# Patient Record
Sex: Female | Born: 1960 | Race: White | Hispanic: No | Smoking: Current every day smoker
Health system: Southern US, Community
[De-identification: ages and names within clinical notes are randomized; demographics above are authoritative.]

## PROBLEM LIST (undated history)

## (undated) DIAGNOSIS — R040 Epistaxis: Secondary | ICD-10-CM

## (undated) DIAGNOSIS — N390 Urinary tract infection, site not specified: Secondary | ICD-10-CM

## (undated) DIAGNOSIS — E785 Hyperlipidemia, unspecified: Secondary | ICD-10-CM

## (undated) DIAGNOSIS — Z973 Presence of spectacles and contact lenses: Secondary | ICD-10-CM

## (undated) DIAGNOSIS — E7800 Pure hypercholesterolemia, unspecified: Secondary | ICD-10-CM

## (undated) DIAGNOSIS — I10 Essential (primary) hypertension: Secondary | ICD-10-CM

## (undated) DIAGNOSIS — J410 Simple chronic bronchitis: Secondary | ICD-10-CM

## (undated) DIAGNOSIS — J301 Allergic rhinitis due to pollen: Secondary | ICD-10-CM

## (undated) DIAGNOSIS — K649 Unspecified hemorrhoids: Secondary | ICD-10-CM

## (undated) DIAGNOSIS — N393 Stress incontinence (female) (male): Secondary | ICD-10-CM

## (undated) DIAGNOSIS — R000 Tachycardia, unspecified: Secondary | ICD-10-CM

## (undated) DIAGNOSIS — N812 Incomplete uterovaginal prolapse: Secondary | ICD-10-CM

## (undated) DIAGNOSIS — Z87440 Personal history of urinary (tract) infections: Secondary | ICD-10-CM

## (undated) HISTORY — DX: Allergic rhinitis due to pollen: J30.1

## (undated) HISTORY — DX: Urinary tract infection, site not specified: N39.0

## (undated) HISTORY — PX: COLONOSCOPY: SHX174

## (undated) HISTORY — DX: Tachycardia, unspecified: R00.0

---

## 1998-05-30 ENCOUNTER — Other Ambulatory Visit: Admission: RE | Admit: 1998-05-30 | Discharge: 1998-05-30 | Payer: Self-pay | Admitting: Gynecology

## 1999-10-24 ENCOUNTER — Other Ambulatory Visit: Admission: RE | Admit: 1999-10-24 | Discharge: 1999-10-24 | Payer: Self-pay | Admitting: Gynecology

## 2000-12-21 ENCOUNTER — Other Ambulatory Visit: Admission: RE | Admit: 2000-12-21 | Discharge: 2000-12-21 | Payer: Self-pay | Admitting: Gynecology

## 2002-01-17 ENCOUNTER — Other Ambulatory Visit: Admission: RE | Admit: 2002-01-17 | Discharge: 2002-01-17 | Payer: Self-pay | Admitting: Gynecology

## 2003-02-21 ENCOUNTER — Other Ambulatory Visit: Admission: RE | Admit: 2003-02-21 | Discharge: 2003-02-21 | Payer: Self-pay | Admitting: Gynecology

## 2004-08-15 ENCOUNTER — Other Ambulatory Visit: Admission: RE | Admit: 2004-08-15 | Discharge: 2004-08-15 | Payer: Self-pay | Admitting: Gynecology

## 2006-11-07 ENCOUNTER — Emergency Department (HOSPITAL_COMMUNITY): Admission: EM | Admit: 2006-11-07 | Discharge: 2006-11-08 | Payer: Self-pay | Admitting: Emergency Medicine

## 2010-12-27 LAB — BASIC METABOLIC PANEL
BUN: 11
Chloride: 104
Glucose, Bld: 141 — ABNORMAL HIGH
Potassium: 3.4 — ABNORMAL LOW
Sodium: 138

## 2010-12-27 LAB — CBC
HCT: 41.8
Hemoglobin: 14.8
MCV: 91.6
Platelets: 372
WBC: 11.3 — ABNORMAL HIGH

## 2011-07-24 ENCOUNTER — Other Ambulatory Visit: Payer: Self-pay | Admitting: Gynecology

## 2011-07-24 DIAGNOSIS — R928 Other abnormal and inconclusive findings on diagnostic imaging of breast: Secondary | ICD-10-CM

## 2011-07-29 ENCOUNTER — Ambulatory Visit
Admission: RE | Admit: 2011-07-29 | Discharge: 2011-07-29 | Disposition: A | Discharge status: Home / Self Care | Payer: BC Managed Care – PPO | Source: Ambulatory Visit | Attending: Gynecology | Admitting: Gynecology

## 2011-07-29 DIAGNOSIS — R928 Other abnormal and inconclusive findings on diagnostic imaging of breast: Secondary | ICD-10-CM

## 2012-01-02 ENCOUNTER — Telehealth: Payer: Self-pay | Admitting: *Deleted

## 2012-01-02 NOTE — Telephone Encounter (Signed)
Patient says she dropped off a form to be filled out for work yesterday and wants to know when it can be picked up.  She says she needs it by Monday afternoon.   [phone].

## 2012-02-06 ENCOUNTER — Other Ambulatory Visit: Payer: Self-pay | Admitting: Gynecology

## 2012-02-06 DIAGNOSIS — N63 Unspecified lump in unspecified breast: Secondary | ICD-10-CM

## 2012-02-20 ENCOUNTER — Ambulatory Visit
Admission: RE | Admit: 2012-02-20 | Discharge: 2012-02-20 | Disposition: A | Discharge status: Home / Self Care | Payer: BC Managed Care – PPO | Source: Ambulatory Visit | Attending: Gynecology | Admitting: Gynecology

## 2012-02-20 DIAGNOSIS — N63 Unspecified lump in unspecified breast: Secondary | ICD-10-CM

## 2012-07-20 ENCOUNTER — Other Ambulatory Visit: Payer: Self-pay | Admitting: *Deleted

## 2013-06-28 ENCOUNTER — Other Ambulatory Visit: Payer: Self-pay | Admitting: Physician Assistant

## 2013-06-28 ENCOUNTER — Ambulatory Visit
Admission: RE | Admit: 2013-06-28 | Discharge: 2013-06-28 | Disposition: A | Discharge status: Home / Self Care | Payer: BC Managed Care – PPO | Source: Ambulatory Visit | Attending: Physician Assistant | Admitting: Physician Assistant

## 2013-06-28 DIAGNOSIS — R109 Unspecified abdominal pain: Secondary | ICD-10-CM

## 2013-06-28 DIAGNOSIS — R197 Diarrhea, unspecified: Secondary | ICD-10-CM

## 2013-06-30 ENCOUNTER — Emergency Department (HOSPITAL_COMMUNITY): Payer: BC Managed Care – PPO | Admitting: Anesthesiology

## 2013-06-30 ENCOUNTER — Inpatient Hospital Stay (HOSPITAL_COMMUNITY)
Admission: EM | Admit: 2013-06-30 | Discharge: 2013-07-09 | DRG: 340 | Disposition: A | Discharge status: Home / Self Care | Payer: BC Managed Care – PPO | Attending: Surgery | Admitting: Surgery

## 2013-06-30 ENCOUNTER — Encounter (HOSPITAL_COMMUNITY): Payer: Self-pay | Admitting: Emergency Medicine

## 2013-06-30 ENCOUNTER — Other Ambulatory Visit: Payer: BC Managed Care – PPO

## 2013-06-30 ENCOUNTER — Encounter (HOSPITAL_COMMUNITY): Admission: EM | Disposition: A | Discharge status: Home / Self Care | Payer: Self-pay | Source: Home / Self Care

## 2013-06-30 ENCOUNTER — Encounter (HOSPITAL_COMMUNITY): Payer: BC Managed Care – PPO | Admitting: Anesthesiology

## 2013-06-30 ENCOUNTER — Ambulatory Visit
Admission: RE | Admit: 2013-06-30 | Discharge: 2013-06-30 | Disposition: A | Discharge status: Home / Self Care | Payer: BC Managed Care – PPO | Source: Ambulatory Visit | Attending: Physician Assistant | Admitting: Physician Assistant

## 2013-06-30 DIAGNOSIS — K3532 Acute appendicitis with perforation and localized peritonitis, without abscess: Secondary | ICD-10-CM | POA: Diagnosis present

## 2013-06-30 DIAGNOSIS — F17200 Nicotine dependence, unspecified, uncomplicated: Secondary | ICD-10-CM | POA: Diagnosis present

## 2013-06-30 DIAGNOSIS — E7800 Pure hypercholesterolemia, unspecified: Secondary | ICD-10-CM | POA: Diagnosis present

## 2013-06-30 DIAGNOSIS — I10 Essential (primary) hypertension: Secondary | ICD-10-CM | POA: Diagnosis present

## 2013-06-30 DIAGNOSIS — R1031 Right lower quadrant pain: Secondary | ICD-10-CM

## 2013-06-30 DIAGNOSIS — R112 Nausea with vomiting, unspecified: Secondary | ICD-10-CM

## 2013-06-30 DIAGNOSIS — R197 Diarrhea, unspecified: Secondary | ICD-10-CM

## 2013-06-30 DIAGNOSIS — K3533 Acute appendicitis with perforation and localized peritonitis, with abscess: Principal | ICD-10-CM | POA: Diagnosis present

## 2013-06-30 DIAGNOSIS — Z7982 Long term (current) use of aspirin: Secondary | ICD-10-CM

## 2013-06-30 DIAGNOSIS — Z79899 Other long term (current) drug therapy: Secondary | ICD-10-CM

## 2013-06-30 DIAGNOSIS — R109 Unspecified abdominal pain: Secondary | ICD-10-CM

## 2013-06-30 DIAGNOSIS — N739 Female pelvic inflammatory disease, unspecified: Secondary | ICD-10-CM

## 2013-06-30 DIAGNOSIS — K3580 Unspecified acute appendicitis: Secondary | ICD-10-CM

## 2013-06-30 HISTORY — PX: LAPAROSCOPIC APPENDECTOMY: SHX408

## 2013-06-30 HISTORY — DX: Essential (primary) hypertension: I10

## 2013-06-30 HISTORY — DX: Pure hypercholesterolemia, unspecified: E78.00

## 2013-06-30 LAB — COMPREHENSIVE METABOLIC PANEL
ALT: 16 U/L (ref 0–35)
AST: 17 U/L (ref 0–37)
Albumin: 3.5 g/dL (ref 3.5–5.2)
Alkaline Phosphatase: 96 U/L (ref 39–117)
BUN: 14 mg/dL (ref 6–23)
CALCIUM: 9.7 mg/dL (ref 8.4–10.5)
CO2: 20 mEq/L (ref 19–32)
Chloride: 91 mEq/L — ABNORMAL LOW (ref 96–112)
Creatinine, Ser: 0.69 mg/dL (ref 0.50–1.10)
GFR calc non Af Amer: >90 mL/min (ref >90)
Glucose, Bld: 103 mg/dL — ABNORMAL HIGH (ref 70–99)
Potassium: 3.9 mEq/L (ref 3.7–5.3)
SODIUM: 133 meq/L — AB (ref 137–147)
TOTAL PROTEIN: 7.7 g/dL (ref 6.0–8.3)
Total Bilirubin: 0.4 mg/dL (ref 0.3–1.2)

## 2013-06-30 LAB — CBC WITH DIFFERENTIAL/PLATELET
BASOS ABS: 0.2 10*3/uL — AB (ref 0.0–0.1)
Basophils Relative: 1 % (ref 0–1)
Eosinophils Absolute: 0.2 10*3/uL (ref 0.0–0.7)
Eosinophils Relative: 1 % (ref 0–5)
HCT: 35.9 % — ABNORMAL LOW (ref 36.0–46.0)
Hemoglobin: 13 g/dL (ref 12.0–15.0)
LYMPHS PCT: 12 % (ref 12–46)
Lymphs Abs: 2 10*3/uL (ref 0.7–4.0)
MCH: 31.6 pg (ref 26.0–34.0)
MCHC: 36.2 g/dL — ABNORMAL HIGH (ref 30.0–36.0)
MCV: 87.1 fL (ref 78.0–100.0)
MONOS PCT: 9 % (ref 3–12)
Monocytes Absolute: 1.5 10*3/uL — ABNORMAL HIGH (ref 0.1–1.0)
Neutro Abs: 12.5 10*3/uL — ABNORMAL HIGH (ref 1.7–7.7)
Neutrophils Relative %: 77 % (ref 43–77)
PLATELETS: 416 10*3/uL — AB (ref 150–400)
RBC: 4.12 MIL/uL (ref 3.87–5.11)
RDW: 12.3 % (ref 11.5–15.5)
WBC: 16.4 10*3/uL — AB (ref 4.0–10.5)

## 2013-06-30 SURGERY — APPENDECTOMY, LAPAROSCOPIC
Anesthesia: General | Site: Abdomen

## 2013-06-30 MED ORDER — SODIUM CHLORIDE 0.9 % IR SOLN
Status: DC | PRN
  Administered 2013-06-30: 2000 mL

## 2013-06-30 MED ORDER — STERILE WATER FOR INJECTION IJ SOLN
INTRAMUSCULAR | Status: AC
  Filled 2013-06-30: qty 10

## 2013-06-30 MED ORDER — PHENYLEPHRINE 40 MCG/ML (10ML) SYRINGE FOR IV PUSH (FOR BLOOD PRESSURE SUPPORT)
PREFILLED_SYRINGE | INTRAVENOUS | Status: AC
  Filled 2013-06-30: qty 10

## 2013-06-30 MED ORDER — PROMETHAZINE HCL 25 MG/ML IJ SOLN: 6.2500 mg | INTRAMUSCULAR | Status: DC | PRN

## 2013-06-30 MED ORDER — OXYCODONE-ACETAMINOPHEN 5-325 MG PO TABS
1.0000 | ORAL_TABLET | ORAL | Status: DC | PRN
  Administered 2013-07-01: 1 via ORAL
  Administered 2013-07-02 – 2013-07-09 (×17): 2 via ORAL
  Filled 2013-06-30 (×9): qty 2
  Filled 2013-06-30: qty 1
  Filled 2013-06-30 (×8): qty 2

## 2013-06-30 MED ORDER — DEXAMETHASONE SODIUM PHOSPHATE 4 MG/ML IJ SOLN
INTRAMUSCULAR | Status: DC | PRN
  Administered 2013-06-30: 4 mg via INTRAVENOUS

## 2013-06-30 MED ORDER — LIDOCAINE HCL (CARDIAC) 20 MG/ML IV SOLN
INTRAVENOUS | Status: AC
  Filled 2013-06-30: qty 5

## 2013-06-30 MED ORDER — NEOSTIGMINE METHYLSULFATE 1 MG/ML IJ SOLN
INTRAMUSCULAR | Status: DC | PRN
  Administered 2013-06-30: 3 mg via INTRAVENOUS

## 2013-06-30 MED ORDER — OXYCODONE HCL 5 MG PO TABS
5.0000 mg | ORAL_TABLET | Freq: Once | ORAL | Status: AC | PRN
  Administered 2013-06-30: 5 mg via ORAL

## 2013-06-30 MED ORDER — OXYCODONE HCL 5 MG PO TABS
ORAL_TABLET | ORAL | Status: AC
Start: 2013-06-30 — End: 2013-07-01
  Filled 2013-06-30: qty 1

## 2013-06-30 MED ORDER — HYDROMORPHONE HCL PF 1 MG/ML IJ SOLN: 0.2500 mg | INTRAMUSCULAR | Status: DC | PRN

## 2013-06-30 MED ORDER — ARTIFICIAL TEARS OP OINT
TOPICAL_OINTMENT | OPHTHALMIC | Status: AC
  Filled 2013-06-30: qty 3.5

## 2013-06-30 MED ORDER — METRONIDAZOLE IN NACL 5-0.79 MG/ML-% IV SOLN
500.0000 mg | Freq: Three times a day (TID) | INTRAVENOUS | Status: DC
  Administered 2013-06-30 – 2013-07-04 (×11): 500 mg via INTRAVENOUS
  Filled 2013-06-30 (×12): qty 100

## 2013-06-30 MED ORDER — NEOSTIGMINE METHYLSULFATE 1 MG/ML IJ SOLN
INTRAMUSCULAR | Status: AC
  Filled 2013-06-30: qty 10

## 2013-06-30 MED ORDER — LACTATED RINGERS IV SOLN
INTRAVENOUS | Status: DC | PRN
  Administered 2013-06-30 (×2): via INTRAVENOUS

## 2013-06-30 MED ORDER — ONDANSETRON HCL 4 MG/2ML IJ SOLN
INTRAMUSCULAR | Status: AC
  Filled 2013-06-30: qty 2

## 2013-06-30 MED ORDER — ONDANSETRON HCL 4 MG/2ML IJ SOLN
4.0000 mg | Freq: Four times a day (QID) | INTRAMUSCULAR | Status: DC | PRN
  Administered 2013-07-02: 4 mg via INTRAVENOUS
  Filled 2013-06-30: qty 2

## 2013-06-30 MED ORDER — BUPIVACAINE HCL 0.25 % IJ SOLN
INTRAMUSCULAR | Status: DC | PRN
  Administered 2013-06-30: 20 mL

## 2013-06-30 MED ORDER — IBUPROFEN 600 MG PO TABS
600.0000 mg | ORAL_TABLET | Freq: Four times a day (QID) | ORAL | Status: DC | PRN
  Administered 2013-06-30 – 2013-07-01 (×3): 600 mg via ORAL
  Filled 2013-06-30 (×3): qty 1

## 2013-06-30 MED ORDER — ARTIFICIAL TEARS OP OINT
TOPICAL_OINTMENT | OPHTHALMIC | Status: DC | PRN
  Administered 2013-06-30: 1 via OPHTHALMIC

## 2013-06-30 MED ORDER — HYDROMORPHONE HCL PF 1 MG/ML IJ SOLN: 1.0000 mg | INTRAMUSCULAR | Status: DC | PRN

## 2013-06-30 MED ORDER — PROPOFOL 10 MG/ML IV BOLUS
INTRAVENOUS | Status: DC | PRN
  Administered 2013-06-30: 200 mg via INTRAVENOUS

## 2013-06-30 MED ORDER — CIPROFLOXACIN IN D5W 400 MG/200ML IV SOLN
400.0000 mg | Freq: Once | INTRAVENOUS | Status: AC
  Administered 2013-06-30: 400 mg via INTRAVENOUS

## 2013-06-30 MED ORDER — GLYCOPYRROLATE 0.2 MG/ML IJ SOLN
INTRAMUSCULAR | Status: AC
  Filled 2013-06-30: qty 3

## 2013-06-30 MED ORDER — ONDANSETRON HCL 4 MG/2ML IJ SOLN
INTRAMUSCULAR | Status: DC | PRN
  Administered 2013-06-30: 4 mg via INTRAVENOUS

## 2013-06-30 MED ORDER — VECURONIUM BROMIDE 10 MG IV SOLR
INTRAVENOUS | Status: AC
  Filled 2013-06-30: qty 10

## 2013-06-30 MED ORDER — VECURONIUM BROMIDE 10 MG IV SOLR
INTRAVENOUS | Status: DC | PRN
  Administered 2013-06-30: 2 mg via INTRAVENOUS
  Administered 2013-06-30: 1 mg via INTRAVENOUS

## 2013-06-30 MED ORDER — IOHEXOL 300 MG/ML  SOLN
100.0000 mL | Freq: Once | INTRAMUSCULAR | Status: AC | PRN
  Administered 2013-06-30: 100 mL via INTRAVENOUS

## 2013-06-30 MED ORDER — SUCCINYLCHOLINE CHLORIDE 20 MG/ML IJ SOLN
INTRAMUSCULAR | Status: AC
  Filled 2013-06-30: qty 1

## 2013-06-30 MED ORDER — PROPOFOL 10 MG/ML IV BOLUS
INTRAVENOUS | Status: AC
  Filled 2013-06-30: qty 20

## 2013-06-30 MED ORDER — GLYCOPYRROLATE 0.2 MG/ML IJ SOLN
INTRAMUSCULAR | Status: DC | PRN
  Administered 2013-06-30: 0.4 mg via INTRAVENOUS

## 2013-06-30 MED ORDER — HYDROMORPHONE HCL PF 1 MG/ML IJ SOLN
INTRAMUSCULAR | Status: AC
  Filled 2013-06-30: qty 1

## 2013-06-30 MED ORDER — MIDAZOLAM HCL 2 MG/2ML IJ SOLN
INTRAMUSCULAR | Status: AC
  Filled 2013-06-30: qty 2

## 2013-06-30 MED ORDER — ENOXAPARIN SODIUM 40 MG/0.4ML ~~LOC~~ SOLN
40.0000 mg | SUBCUTANEOUS | Status: DC
  Administered 2013-07-01 – 2013-07-04 (×4): 40 mg via SUBCUTANEOUS
  Filled 2013-06-30 (×4): qty 0.4

## 2013-06-30 MED ORDER — SODIUM CHLORIDE 0.9 % IV BOLUS (SEPSIS)
1000.0000 mL | Freq: Once | INTRAVENOUS | Status: AC
  Administered 2013-06-30: 1000 mL via INTRAVENOUS

## 2013-06-30 MED ORDER — CIPROFLOXACIN IN D5W 400 MG/200ML IV SOLN
400.0000 mg | Freq: Two times a day (BID) | INTRAVENOUS | Status: DC
  Administered 2013-07-01 – 2013-07-03 (×6): 400 mg via INTRAVENOUS
  Filled 2013-06-30 (×7): qty 200

## 2013-06-30 MED ORDER — BUPIVACAINE HCL (PF) 0.25 % IJ SOLN
INTRAMUSCULAR | Status: AC
  Filled 2013-06-30: qty 30

## 2013-06-30 MED ORDER — SUCCINYLCHOLINE CHLORIDE 20 MG/ML IJ SOLN
INTRAMUSCULAR | Status: DC | PRN
  Administered 2013-06-30: 120 mg via INTRAVENOUS

## 2013-06-30 MED ORDER — FENTANYL CITRATE 0.05 MG/ML IJ SOLN
INTRAMUSCULAR | Status: DC | PRN
  Administered 2013-06-30 (×3): 50 ug via INTRAVENOUS
  Administered 2013-06-30: 100 ug via INTRAVENOUS

## 2013-06-30 MED ORDER — LIDOCAINE HCL (CARDIAC) 20 MG/ML IV SOLN
INTRAVENOUS | Status: DC | PRN
  Administered 2013-06-30 (×2): 50 mg via INTRAVENOUS

## 2013-06-30 MED ORDER — POTASSIUM CHLORIDE IN NACL 20-0.9 MEQ/L-% IV SOLN
INTRAVENOUS | Status: DC
  Administered 2013-06-30 – 2013-07-01 (×2): via INTRAVENOUS
  Filled 2013-06-30 (×10): qty 1000

## 2013-06-30 MED ORDER — OXYCODONE HCL 5 MG/5ML PO SOLN: 5.0000 mg | Freq: Once | ORAL | Status: AC | PRN

## 2013-06-30 MED ORDER — FENTANYL CITRATE 0.05 MG/ML IJ SOLN
INTRAMUSCULAR | Status: AC
  Filled 2013-06-30: qty 5

## 2013-06-30 MED ORDER — MIDAZOLAM HCL 5 MG/5ML IJ SOLN
INTRAMUSCULAR | Status: DC | PRN
  Administered 2013-06-30: 2 mg via INTRAVENOUS

## 2013-06-30 MED ORDER — ONDANSETRON HCL 4 MG PO TABS: 4.0000 mg | ORAL_TABLET | Freq: Four times a day (QID) | ORAL | Status: DC | PRN

## 2013-06-30 SURGICAL SUPPLY — 57 items
ADH SKN CLS APL DERMABOND .7 (GAUZE/BANDAGES/DRESSINGS) ×2
APL SKNCLS STERI-STRIP NONHPOA (GAUZE/BANDAGES/DRESSINGS) ×2
APPLIER CLIP LOGIC TI 5 (MISCELLANEOUS) IMPLANT
APPLIER CLIP ROT 10 11.4 M/L (STAPLE)
APR CLP MED LRG 11.4X10 (STAPLE)
APR CLP MED LRG 33X5 (MISCELLANEOUS)
BAG SPEC RTRVL LRG 6X4 10 (ENDOMECHANICALS) ×2
BANDAGE ADHESIVE 1X3 (GAUZE/BANDAGES/DRESSINGS) ×12 IMPLANT
BENZOIN TINCTURE PRP APPL 2/3 (GAUZE/BANDAGES/DRESSINGS) ×4 IMPLANT
CANISTER SUCTION 2500CC (MISCELLANEOUS) ×4 IMPLANT
CHLORAPREP W/TINT 26ML (MISCELLANEOUS) ×4 IMPLANT
CLIP APPLIE ROT 10 11.4 M/L (STAPLE) IMPLANT
COVER SURGICAL LIGHT HANDLE (MISCELLANEOUS) ×4 IMPLANT
CUTTER LINEAR ENDO 35 ETS (STAPLE) IMPLANT
CUTTER LINEAR ENDO 35 ETS TH (STAPLE) IMPLANT
DECANTER SPIKE VIAL GLASS SM (MISCELLANEOUS) ×4 IMPLANT
DERMABOND ADVANCED (GAUZE/BANDAGES/DRESSINGS) ×2
DERMABOND ADVANCED .7 DNX12 (GAUZE/BANDAGES/DRESSINGS) ×2 IMPLANT
DRAIN CHANNEL 19F RND (DRAIN) ×4 IMPLANT
DRAPE UTILITY 15X26 W/TAPE STR (DRAPE) ×8 IMPLANT
ELECT REM PT RETURN 9FT ADLT (ELECTROSURGICAL) ×4
ELECTRODE REM PT RTRN 9FT ADLT (ELECTROSURGICAL) ×2 IMPLANT
ENDOLOOP SUT PDS II  0 18 (SUTURE)
ENDOLOOP SUT PDS II 0 18 (SUTURE) IMPLANT
EVACUATOR SILICONE 100CC (DRAIN) ×4 IMPLANT
GLOVE BIOGEL PI IND STRL 6.5 (GLOVE) ×1 IMPLANT
GLOVE BIOGEL PI IND STRL 7.5 (GLOVE) ×2 IMPLANT
GLOVE BIOGEL PI INDICATOR 6.5 (GLOVE) ×2
GLOVE BIOGEL PI INDICATOR 7.5 (GLOVE) ×2
GLOVE SURG SIGNA 7.5 PF LTX (GLOVE) ×4 IMPLANT
GLOVE SURG SS PI 7.5 STRL IVOR (GLOVE) ×3 IMPLANT
GOWN STRL REUS W/ TWL LRG LVL3 (GOWN DISPOSABLE) ×2 IMPLANT
GOWN STRL REUS W/ TWL XL LVL3 (GOWN DISPOSABLE) ×2 IMPLANT
GOWN STRL REUS W/TWL LRG LVL3 (GOWN DISPOSABLE) ×4
GOWN STRL REUS W/TWL XL LVL3 (GOWN DISPOSABLE) ×4
KIT BASIN OR (CUSTOM PROCEDURE TRAY) ×4 IMPLANT
KIT ROOM TURNOVER OR (KITS) ×4 IMPLANT
NS IRRIG 1000ML POUR BTL (IV SOLUTION) ×7 IMPLANT
PAD ARMBOARD 7.5X6 YLW CONV (MISCELLANEOUS) ×8 IMPLANT
POUCH SPECIMEN RETRIEVAL 10MM (ENDOMECHANICALS) ×4 IMPLANT
RELOAD /EVU35 (ENDOMECHANICALS) IMPLANT
RELOAD CUTTER ETS 35MM STAND (ENDOMECHANICALS) ×4 IMPLANT
SCALPEL HARMONIC ACE (MISCELLANEOUS) ×4 IMPLANT
SET IRRIG TUBING LAPAROSCOPIC (IRRIGATION / IRRIGATOR) ×4 IMPLANT
SLEEVE ENDOPATH XCEL 5M (ENDOMECHANICALS) ×4 IMPLANT
SPECIMEN JAR SMALL (MISCELLANEOUS) ×4 IMPLANT
SPONGE DRAIN TRACH 4X4 STRL 2S (GAUZE/BANDAGES/DRESSINGS) ×4 IMPLANT
SPONGE GAUZE 4X4 12PLY (GAUZE/BANDAGES/DRESSINGS) ×3 IMPLANT
SUT ETHILON 2 0 FS 18 (SUTURE) ×6 IMPLANT
SUT MON AB 4-0 PC3 18 (SUTURE) ×4 IMPLANT
TAPE CLOTH SURG 4X10 WHT LF (GAUZE/BANDAGES/DRESSINGS) ×4 IMPLANT
TOWEL OR 17X24 6PK STRL BLUE (TOWEL DISPOSABLE) ×4 IMPLANT
TOWEL OR 17X26 10 PK STRL BLUE (TOWEL DISPOSABLE) ×4 IMPLANT
TRAY LAPAROSCOPIC (CUSTOM PROCEDURE TRAY) ×4 IMPLANT
TROCAR XCEL BLUNT TIP 100MML (ENDOMECHANICALS) ×4 IMPLANT
TROCAR XCEL NON-BLD 5MMX100MML (ENDOMECHANICALS) ×4 IMPLANT
WATER STERILE IRR 1000ML POUR (IV SOLUTION) IMPLANT

## 2013-06-30 NOTE — Progress Notes (Signed)
Late entry for 2220- Pt arrived to 6N08 via stretcher from PACU; Assisted pt to BR, unable to void at this time; Pt ambulated to bed w/ assist; SR up x 2, call bell within reach, introduced pt to nurse and room; Family notified via phone by PACU nurse; Pt in no apparent distress; surgical sites unremarkable at this time, SCDs on...... Marvia PicklesLeesa Sara Jamani Eley, RN

## 2013-06-30 NOTE — ED Provider Notes (Signed)
CSN: [id number]     Arrival date & time 06/30/13  1648 History   First MD Initiated Contact with Patient 06/30/13 1845     Chief Complaint  Patient presents with  . Abdominal Pain     (Consider location/radiation/quality/duration/timing/severity/associated sxs/prior Treatment) HPI 53 year old female with 6 days of abdominal pain. She states it is severe the first few days and then seemed to get better. 3 days ago she noted a fever of 101 but has not checked her temperature since. She was suppose to get a CT scan 2 days ago with a CT scan was broken. Today she received her CT scan and her PCP called her with results that indicated acute appendicitis with rupture. Patient states her pain is about 4/10. She's been able to eat and has not changed amount of pain she is then. No vomiting. Patient states the pain seems to get worse at times and feels like severe cramps like contractions.  Past Medical History  Diagnosis Date  . Hypertension   . Hypercholesteremia    History reviewed. No pertinent past surgical history. No family history on file. History  Substance Use Topics  . Smoking status: Current Every Day Smoker  . Smokeless tobacco: Not on file  . Alcohol Use: Yes   OB History   Grav Para Term Preterm Abortions TAB SAB Ect Mult Living                 Review of Systems  Constitutional: Positive for fever.  Gastrointestinal: Positive for abdominal pain. Negative for vomiting.  Genitourinary: Negative for dysuria.  Musculoskeletal: Negative for back pain.  All other systems reviewed and are negative.     Allergies  Cephalexin  Home Medications   Prior to Admission medications   Not on File   BP 105/55  Pulse 91  Temp(Src) 100.7 F (38.2 C) (Oral)  Resp 18  Ht 5\' 3"  (1.6 m)  Wt 153 lb (69.4 kg)  BMI 27.11 kg/m2  SpO2 98% Physical Exam  Nursing note and vitals reviewed. Constitutional: She is oriented to person, place, and time. She appears well-developed and  well-nourished. No distress.  HENT:  Head: Normocephalic and atraumatic.  Right Ear: External ear normal.  Left Ear: External ear normal.  Nose: Nose normal.  Eyes: Right eye exhibits no discharge. Left eye exhibits no discharge.  Cardiovascular: Normal rate, regular rhythm and normal heart sounds.   Pulmonary/Chest: Effort normal and breath sounds normal.  Abdominal: Soft. There is tenderness in the right lower quadrant. There is no rigidity, no rebound and no guarding.  Neurological: She is alert and oriented to person, place, and time.  Skin: Skin is warm and dry.    ED Course  Procedures (including critical care time) Labs Review Labs Reviewed  CBC WITH DIFFERENTIAL - Abnormal; Notable for the following:    WBC 16.4 (*)    HCT 35.9 (*)    MCHC 36.2 (*)    Platelets 416 (*)    Neutro Abs 12.5 (*)    Monocytes Absolute 1.5 (*)    Basophils Absolute 0.2 (*)    All other components within normal limits  COMPREHENSIVE METABOLIC PANEL - Abnormal; Notable for the following:    Sodium 133 (*)    Chloride 91 (*)    Glucose, Bld 103 (*)    All other components within normal limits    Imaging Review Ct Abdomen Pelvis W Contrast  06/30/2013   CLINICAL DATA:  Mid abdominal pain and diarrhea.  Fever for 4 days.  EXAM: CT ABDOMEN AND PELVIS WITH CONTRAST  TECHNIQUE: Multidetector CT imaging of the abdomen and pelvis was performed using the standard protocol following bolus administration of intravenous contrast.  CONTRAST:  100mL OMNIPAQUE IOHEXOL 300 MG/ML  SOLN  COMPARISON:  None.  FINDINGS: There are inflammatory changes in the right lower quadrant. The appendix is thick walled and dilated to 12 mm. The appendix does contain some contrast and air. The appendix is discontiguous at its base, where there is a small amount of extraluminal contrast. There is also a small extraluminal collection with nondependent air adjacent to the cecal tip measuring 3 cm x 2 cm in size. Inflammatory  stranding is seen in the adjacent fat. There is mild thickening of the wall of the adjacent ileum, which is presumed to be reactive. These findings are consistent with acute appendicitis and appendiceal rupture.  Minor lung base subsegmental atelectasis. The heart is normal in size.  Liver, spleen, gallbladder and pancreas are unremarkable. No bile duct dilation. No adrenal masses. Small left anterior midpole renal cyst. Kidneys otherwise unremarkable. Normal ureters. Normal bladder. Uterus is unremarkable. Right ovary is contiguous with right lower quadrant inflammation. No ovarian mass.  Multiple sigmoid colon diverticula. No diverticulitis. Bowel is otherwise unremarkable. No ascites.  There are degenerative changes of the visualized spine. 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  IMPRESSION: 1. Acute appendicitis with appendiceal rupture, reflected by a small amount of extraluminal air and contrast, and a small periappendiceal abscess. 2. No other acute findings. Critical Value/emergent results were called by telephone at the time of interpretation on 06/30/2013 at 3:38 PM to Dr. Clovis RileyMITCHELL , who verbally acknowledged these results.   Electronically Signed   By: Amie Portlandavid  Ormond M.D.   On: 06/30/2013 15:43     EKG Interpretation None      MDM   Final diagnoses:  Acute appendicitis with rupture    Patient's abdomen with tenderness right lower quadrant no peritonitis. She declines pain medication at this time. She's been made n.p.o. And I have discussed her case with Dr. Magnus IvanBlackman of surgery. He recommends Zosyn and will come to admit the patient.    Audree CamelScott T Yamilet Mcfayden, MD 07/01/13 0000

## 2013-06-30 NOTE — Op Note (Signed)
APPENDECTOMY LAPAROSCOPIC  Procedure Note  [name] 06/30/2013   Pre-op Diagnosis: Ruptured Appendix     Post-op Diagnosis: same  Procedure(s): APPENDECTOMY LAPAROSCOPIC  Surgeon(s): Shelly Rubensteinouglas A Lilyanna Lunt, MD  Anesthesia: General  Staff:  Circulator: Sandria Baleshalondra C Yelverton, RN Scrub Person: Lottie MusselSherrall Diana Watkins, CST  Estimated Blood Loss: Minimal               Specimens: sent to path          Shelly Rubensteinouglas A Jari Carollo   Date: 06/30/2013  Time: 9:31 PM

## 2013-06-30 NOTE — H&P (Signed)
[name] is an 53 y.o. female.   Chief Complaint: Right lower quadrant abdominal pain HPI: This patient presents with a history of right lower quadrant abdominal pain, nausea, vomiting, and diarrhea. Her discomfort started last week. She had one episode of emesis over the weekend. She then started having diarrhea and has had intermittent fevers. She saw her primary care physician a CAT scan was ordered. The CAT scan did not get it done until today. She was then sent to the emergency department. She describes her pain as sharp and moderate in intensity in the right lower quadrant.  She is otherwise healthy and without complaints  Past Medical History  Diagnosis Date  . Hypertension   . Hypercholesteremia     History reviewed. No pertinent past surgical history.  No family history on file. Social History:  reports that she has been smoking.  She does not have any smokeless tobacco history on file. She reports that she drinks alcohol. She reports that she does not use illicit drugs.  Allergies:  Allergies  Allergen Reactions  . Cephalexin      (Not in a hospital admission)  Results for orders placed during the hospital encounter of 06/30/13 (from the past 48 hour(s))  CBC WITH DIFFERENTIAL     Status: Abnormal   Collection Time    06/30/13  5:34 PM      Result Value Ref Range   WBC 16.4 (*) 4.0 - 10.5 K/uL   RBC 4.12  3.87 - 5.11 MIL/uL   Hemoglobin 13.0  12.0 - 15.0 g/dL   HCT 35.9 (*) 36.0 - 46.0 %   MCV 87.1  78.0 - 100.0 fL   MCH 31.6  26.0 - 34.0 pg   MCHC 36.2 (*) 30.0 - 36.0 g/dL   RDW 12.3  11.5 - 15.5 %   Platelets 416 (*) 150 - 400 K/uL   Neutrophils Relative % 77  43 - 77 %   Lymphocytes Relative 12  12 - 46 %   Monocytes Relative 9  3 - 12 %   Eosinophils Relative 1  0 - 5 %   Basophils Relative 1  0 - 1 %   Neutro Abs 12.5 (*) 1.7 - 7.7 K/uL   Lymphs Abs 2.0  0.7 - 4.0 K/uL   Monocytes Absolute 1.5 (*) 0.1 - 1.0 K/uL   Eosinophils Absolute 0.2  0.0 - 0.7  K/uL   Basophils Absolute 0.2 (*) 0.0 - 0.1 K/uL   RBC Morphology POLYCHROMASIA PRESENT     WBC Morphology ATYPICAL LYMPHOCYTES     Smear Review LARGE PLATELETS PRESENT    COMPREHENSIVE METABOLIC PANEL     Status: Abnormal   Collection Time    06/30/13  5:34 PM      Result Value Ref Range   Sodium 133 (*) 137 - 147 mEq/L   Potassium 3.9  3.7 - 5.3 mEq/L   Chloride 91 (*) 96 - 112 mEq/L   CO2 20  19 - 32 mEq/L   Glucose, Bld 103 (*) 70 - 99 mg/dL   BUN 14  6 - 23 mg/dL   Creatinine, Ser 0.69  0.50 - 1.10 mg/dL   Calcium 9.7  8.4 - 10.5 mg/dL   Total Protein 7.7  6.0 - 8.3 g/dL   Albumin 3.5  3.5 - 5.2 g/dL   AST 17  0 - 37 U/L   ALT 16  0 - 35 U/L   Alkaline Phosphatase 96  39 - 117  U/L   Total Bilirubin 0.4  0.3 - 1.2 mg/dL   GFR calc non Af Amer >90  >90 mL/min   GFR calc Af Amer >90  >90 mL/min   Comment: (NOTE)     The eGFR has been calculated using the CKD EPI equation.     This calculation has not been validated in all clinical situations.     eGFR's persistently <90 mL/min signify possible Chronic Kidney     Disease.   Ct Abdomen Pelvis W Contrast  06/30/2013   CLINICAL DATA:  Mid abdominal pain and diarrhea.  Fever for 4 days.  EXAM: CT ABDOMEN AND PELVIS WITH CONTRAST  TECHNIQUE: Multidetector CT imaging of the abdomen and pelvis was performed using the standard protocol following bolus administration of intravenous contrast.  CONTRAST:  141m OMNIPAQUE IOHEXOL 300 MG/ML  SOLN  COMPARISON:  None.  FINDINGS: There are inflammatory changes in the right lower quadrant. The appendix is thick walled and dilated to 12 mm. The appendix does contain some contrast and air. The appendix is discontiguous at its base, where there is a small amount of extraluminal contrast. There is also a small extraluminal collection with nondependent air adjacent to the cecal tip measuring 3 cm x 2 cm in size. Inflammatory stranding is seen in the adjacent fat. There is mild thickening of the wall of  the adjacent ileum, which is presumed to be reactive. These findings are consistent with acute appendicitis and appendiceal rupture.  Minor lung base subsegmental atelectasis. The heart is normal in size.  Liver, spleen, gallbladder and pancreas are unremarkable. No bile duct dilation. No adrenal masses. Small left anterior midpole renal cyst. Kidneys otherwise unremarkable. Normal ureters. Normal bladder. Uterus is unremarkable. Right ovary is contiguous with right lower quadrant inflammation. No ovarian mass.  Multiple sigmoid colon diverticula. No diverticulitis. Bowel is otherwise unremarkable. No ascites.  There are degenerative changes of the visualized spine. 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  IMPRESSION: 1. Acute appendicitis with appendiceal rupture, reflected by a small amount of extraluminal air and contrast, and a small periappendiceal abscess. 2. No other acute findings. Critical Value/emergent results were called by telephone at the time of interpretation on 06/30/2013 at 3:38 PM to Dr. MAlroy Dust, who verbally acknowledged these results.   Electronically Signed   By: DLajean ManesM.D.   On: 06/30/2013 15:43    Review of Systems  Constitutional: Positive for fever.  HENT: Negative.   Eyes: Negative.   Respiratory: Negative.   Cardiovascular: Positive for palpitations.  Gastrointestinal: Positive for nausea, vomiting, abdominal pain and diarrhea.  Genitourinary: Negative.   Musculoskeletal: Negative.   Skin: Negative.   Neurological: Negative.   Endo/Heme/Allergies: Negative.   Psychiatric/Behavioral: Negative.     Blood pressure 116/61, pulse 95, temperature 100.7 F (38.2 C), temperature source Oral, resp. rate 18, height '5\' 3"'  (1.6 m), weight 153 lb (69.4 kg), SpO2 97.00%. Physical Exam  Constitutional: She is oriented to person, place, and time. She appears well-developed and well-nourished. No distress.  HENT:  Head: Normocephalic and  atraumatic.  Right Ear: External ear normal.  Left Ear: External ear normal.  Nose: Nose normal.  Mouth/Throat: Oropharynx is clear and moist. No oropharyngeal exudate.  Eyes: Conjunctivae are normal. Pupils are equal, round, and reactive to light. Right eye exhibits no discharge. Left eye exhibits no discharge. No scleral icterus.  Neck: Normal range of motion. Neck supple. No tracheal deviation present.  Cardiovascular: Normal rate,  normal heart sounds and intact distal pulses.   No murmur heard. Respiratory: Effort normal and breath sounds normal. No respiratory distress. She has no wheezes. She has no rales.  GI: Soft. She exhibits no distension. There is tenderness. There is guarding.  There is moderate tenderness with guarding in the right lower quadrant  Musculoskeletal: Normal range of motion. She exhibits no edema and no tenderness.  Lymphadenopathy:    She has no cervical adenopathy.  Neurological: She is alert and oriented to person, place, and time.  Skin: Skin is warm and dry. No rash noted. She is not diaphoretic. No erythema.  Psychiatric: Her behavior is normal. Judgment normal.     Assessment/Plan Acute appendicitis  I explained the diagnosis to the patient and her family. My plan is to proceed to the operating room for a laparoscopic appendectomy. The appendix appears to be ruptured although there is not a frank abscess that can be drained by interventional radiology. I explained the risks which includes but is not limited to bleeding, infection, injury to surrounding structures, and need to convert to an open procedure, the possibility of a missed diagnosis, the need for further surgery, et Ronney Asters. I also discussed postoperative recovery. Preoperative IV antibiotics will be given. Surgery is scheduled urgently.  Harl Bowie 06/30/2013, 7:46 PM

## 2013-06-30 NOTE — Anesthesia Preprocedure Evaluation (Signed)
Anesthesia Evaluation  Patient identified by MRN, date of birth, ID band Patient awake    Reviewed: Allergy & Precautions, H&P , NPO status , Patient's Chart, lab work & pertinent test results  History of Anesthesia Complications Negative for: history of anesthetic complications  Airway Mallampati: I  Neck ROM: Full    Dental  (+) Teeth Intact, Dental Advisory Given   Pulmonary Current Smoker,  breath sounds clear to auscultation        Cardiovascular hypertension, Rhythm:Regular Rate:Normal     Neuro/Psych negative neurological ROS     GI/Hepatic   Endo/Other    Renal/GU negative Renal ROS     Musculoskeletal   Abdominal   Peds  Hematology negative hematology ROS (+)   Anesthesia Other Findings   Reproductive/Obstetrics                           Anesthesia Physical Anesthesia Plan  ASA: II  Anesthesia Plan: General   Post-op Pain Management:    Induction: Intravenous  Airway Management Planned: Oral ETT  Additional Equipment:   Intra-op Plan:   Post-operative Plan: Extubation in OR  Informed Consent: I have reviewed the patients History and Physical, chart, labs and discussed the procedure including the risks, benefits and alternatives for the proposed anesthesia with the patient or authorized representative who has indicated his/her understanding and acceptance.   Dental advisory given  Plan Discussed with: CRNA and Surgeon  Anesthesia Plan Comments:         Anesthesia Quick Evaluation

## 2013-06-30 NOTE — ED Notes (Signed)
Pt reports lower abdominal pain since last Friday. Had CT scan today, told ruptured appendix, come to ED. Denies any pain at current. Pt is a x 4. No n/v at current, has had diarrhea since Friday.

## 2013-06-30 NOTE — ED Notes (Signed)
Patient last drink of water a couple hours ago. Last food intake was 9am this morning.

## 2013-06-30 NOTE — Anesthesia Procedure Notes (Signed)
Procedure Name: Intubation Date/Time: 06/30/2013 8:26 PM Performed by: Luster LandsbergHASE, Sonny Poth R Pre-anesthesia Checklist: Patient identified, Emergency Drugs available, Suction available and Patient being monitored Patient Re-evaluated:Patient Re-evaluated prior to inductionOxygen Delivery Method: Circle system utilized Preoxygenation: Pre-oxygenation with 100% oxygen Intubation Type: IV induction, Rapid sequence and Cricoid Pressure applied Laryngoscope Size: Mac and 3 Grade View: Grade I Tube type: Oral Tube size: 7.0 mm Number of attempts: 1 Airway Equipment and Method: Stylet Placement Confirmation: ETT inserted through vocal cords under direct vision,  positive ETCO2 and breath sounds checked- equal and bilateral Secured at: 20 cm Tube secured with: Tape Dental Injury: Teeth and Oropharynx as per pre-operative assessment

## 2013-06-30 NOTE — Transfer of Care (Signed)
Immediate Anesthesia Transfer of Care Note  Patient: [name] GuardianSusan K Cala  Procedure(s) Performed: Procedure(s): APPENDECTOMY LAPAROSCOPIC (N/A)  Patient Location: PACU  Anesthesia Type:General  Level of Consciousness: awake, alert  and oriented  Airway & Oxygen Therapy: Patient Spontanous Breathing and Patient connected to nasal cannula oxygen  Post-op Assessment: Report given to PACU RN, Post -op Vital signs reviewed and stable and Patient moving all extremities  Post vital signs: Reviewed & stable   Complications: No apparent anesthesia complications

## 2013-07-01 LAB — CBC
HCT: 35.8 % — ABNORMAL LOW (ref 36.0–46.0)
Hemoglobin: 12.4 g/dL (ref 12.0–15.0)
MCH: 30.7 pg (ref 26.0–34.0)
MCHC: 34.6 g/dL (ref 30.0–36.0)
MCV: 88.6 fL (ref 78.0–100.0)
PLATELETS: 372 10*3/uL (ref 150–400)
RBC: 4.04 MIL/uL (ref 3.87–5.11)
RDW: 12.5 % (ref 11.5–15.5)
WBC: 18.4 10*3/uL — AB (ref 4.0–10.5)

## 2013-07-01 LAB — BASIC METABOLIC PANEL
BUN: 9 mg/dL (ref 6–23)
CALCIUM: 9.1 mg/dL (ref 8.4–10.5)
CHLORIDE: 99 meq/L (ref 96–112)
CO2: 19 mEq/L (ref 19–32)
Creatinine, Ser: 0.59 mg/dL (ref 0.50–1.10)
Glucose, Bld: 148 mg/dL — ABNORMAL HIGH (ref 70–99)
Potassium: 4.2 mEq/L (ref 3.7–5.3)
Sodium: 134 mEq/L — ABNORMAL LOW (ref 137–147)

## 2013-07-01 NOTE — Anesthesia Postprocedure Evaluation (Signed)
  Anesthesia Post-op Note  Patient: Jacqueline GuardianSusan K Grosshans  Procedure(s) Performed: Procedure(s): APPENDECTOMY LAPAROSCOPIC (N/A)  Patient Location: PACU  Anesthesia Type:General  Level of Consciousness: awake and alert   Airway and Oxygen Therapy: Patient Spontanous Breathing  Post-op Pain: mild  Post-op Assessment: Post-op Vital signs reviewed  Post-op Vital Signs: stable  Last Vitals:  Filed Vitals:   07/01/13 0519  BP: 128/52  Pulse: 74  Temp: 36.8 C  Resp: 21    Complications: No apparent anesthesia complications

## 2013-07-01 NOTE — Progress Notes (Signed)
Patient ID: [name], female   DOB: 1960-10-10, 53 y.o.   MRN: [id number]   Subjective: No n/v.  Feels sore, but overall pain is better.  No flatus.  No dysuria.  Walking in room.    Objective:  Vital signs:  Filed Vitals:   06/30/13 2202 06/30/13 2222 07/01/13 0144 07/01/13 0519  BP: 117/57 119/60 131/60 128/52  Pulse: 76 82 86 74  Temp:  98.3 F (36.8 C) 99.6 F (37.6 C) 98.2 F (36.8 C)  TempSrc:  Oral Oral Oral  Resp: '24 21 21 21  ' Height:  '5\' 3"'  (1.6 m)    Weight:  165 lb 14.4 oz (75.252 kg)    SpO2: 93% 93% 95% 93%       Intake/Output   Yesterday:  04/16 0701 - 04/17 0700 In: 2322.1 [P.O.:270; I.V.:2052.1] Out: 550 [Urine:400; Drains:150] This shift:    I/O last 3 completed shifts: In: 2322.1 [P.O.:270; I.V.:2052.1] Out: 550 [Urine:400; Drains:150]    Drain--184m/24h  Physical Exam: General: Pt awake/alert/oriented x4 in no  acute distress Chest: cta.  No chest wall pain w good excursion CV:  Pulses intact.  Regular rhythm Abdomen: +bs. Soft.  Nondistended. Mild ttp rlq and periumbilical region.  Drain with serosanguinous output.  No evidence of peritonitis.  No incarcerated hernias. Ext:  SCDs BLE.  No mjr edema.  No cyanosis Skin: No petechiae / purpura   Problem List:   Active Problems:   Perforated appendicitis    Results:   Labs: Results for orders placed during the hospital encounter of 06/30/13 (from the past 48 hour(s))  CBC WITH DIFFERENTIAL     Status: Abnormal   Collection Time    06/30/13  5:34 PM      Result Value Ref Range   WBC 16.4 (*) 4.0 - 10.5 K/uL   RBC 4.12  3.87 - 5.11 MIL/uL   Hemoglobin 13.0  12.0 - 15.0 g/dL   HCT 35.9 (*) 36.0 - 46.0 %   MCV 87.1  78.0 - 100.0 fL   MCH 31.6  26.0 - 34.0 pg   MCHC 36.2 (*) 30.0 - 36.0 g/dL   RDW 12.3  11.5 - 15.5 %   Platelets 416 (*) 150 - 400 K/uL   Neutrophils Relative % 77  43 - 77 %   Lymphocytes Relative 12  12 - 46 %   Monocytes Relative 9  3 - 12 %   Eosinophils  Relative 1  0 - 5 %   Basophils Relative 1  0 - 1 %   Neutro Abs 12.5 (*) 1.7 - 7.7 K/uL   Lymphs Abs 2.0  0.7 - 4.0 K/uL   Monocytes Absolute 1.5 (*) 0.1 - 1.0 K/uL   Eosinophils Absolute 0.2  0.0 - 0.7 K/uL   Basophils Absolute 0.2 (*) 0.0 - 0.1 K/uL   RBC Morphology POLYCHROMASIA PRESENT     WBC Morphology ATYPICAL LYMPHOCYTES     Smear Review LARGE PLATELETS PRESENT    COMPREHENSIVE METABOLIC PANEL     Status: Abnormal   Collection Time    06/30/13  5:34 PM      Result Value Ref Range   Sodium 133 (*) 137 - 147 mEq/L   Potassium 3.9  3.7 - 5.3 mEq/L   Chloride 91 (*) 96 - 112 mEq/L   CO2 20  19 - 32 mEq/L   Glucose, Bld 103 (*) 70 - 99 mg/dL   BUN 14  6 - 23 mg/dL   Creatinine,  Ser 0.69  0.50 - 1.10 mg/dL   Calcium 9.7  8.4 - 10.5 mg/dL   Total Protein 7.7  6.0 - 8.3 g/dL   Albumin 3.5  3.5 - 5.2 g/dL   AST 17  0 - 37 U/L   ALT 16  0 - 35 U/L   Alkaline Phosphatase 96  39 - 117 U/L   Total Bilirubin 0.4  0.3 - 1.2 mg/dL   GFR calc non Af Amer >90  >90 mL/min   GFR calc Af Amer >90  >90 mL/min   Comment: (NOTE)     The eGFR has been calculated using the CKD EPI equation.     This calculation has not been validated in all clinical situations.     eGFR's persistently <90 mL/min signify possible Chronic Kidney     Disease.  BASIC METABOLIC PANEL     Status: Abnormal   Collection Time    07/01/13  6:17 AM      Result Value Ref Range   Sodium 134 (*) 137 - 147 mEq/L   Potassium 4.2  3.7 - 5.3 mEq/L   Chloride 99  96 - 112 mEq/L   CO2 19  19 - 32 mEq/L   Glucose, Bld 148 (*) 70 - 99 mg/dL   BUN 9  6 - 23 mg/dL   Creatinine, Ser 0.59  0.50 - 1.10 mg/dL   Calcium 9.1  8.4 - 10.5 mg/dL   GFR calc non Af Amer >90  >90 mL/min   GFR calc Af Amer >90  >90 mL/min   Comment: (NOTE)     The eGFR has been calculated using the CKD EPI equation.     This calculation has not been validated in all clinical situations.     eGFR's persistently <90 mL/min signify possible Chronic  Kidney     Disease.  CBC     Status: Abnormal   Collection Time    07/01/13  6:17 AM      Result Value Ref Range   WBC 18.4 (*) 4.0 - 10.5 K/uL   RBC 4.04  3.87 - 5.11 MIL/uL   Hemoglobin 12.4  12.0 - 15.0 g/dL   HCT 35.8 (*) 36.0 - 46.0 %   MCV 88.6  78.0 - 100.0 fL   MCH 30.7  26.0 - 34.0 pg   MCHC 34.6  30.0 - 36.0 g/dL   RDW 12.5  11.5 - 15.5 %   Platelets 372  150 - 400 K/uL    Imaging / Studies: Ct Abdomen Pelvis W Contrast  06/30/2013   CLINICAL DATA:  Mid abdominal pain and diarrhea.  Fever for 4 days.  EXAM: CT ABDOMEN AND PELVIS WITH CONTRAST  TECHNIQUE: Multidetector CT imaging of the abdomen and pelvis was performed using the standard protocol following bolus administration of intravenous contrast.  CONTRAST:  163m OMNIPAQUE IOHEXOL 300 MG/ML  SOLN  COMPARISON:  None.  FINDINGS: There are inflammatory changes in the right lower quadrant. The appendix is thick walled and dilated to 12 mm. The appendix does contain some contrast and air. The appendix is discontiguous at its base, where there is a small amount of extraluminal contrast. There is also a small extraluminal collection with nondependent air adjacent to the cecal tip measuring 3 cm x 2 cm in size. Inflammatory stranding is seen in the adjacent fat. There is mild thickening of the wall of the adjacent ileum, which is presumed to be reactive. These findings are consistent with acute appendicitis and  appendiceal rupture.  Minor lung base subsegmental atelectasis. The heart is normal in size.  Liver, spleen, gallbladder and pancreas are unremarkable. No bile duct dilation. No adrenal masses. Small left anterior midpole renal cyst. Kidneys otherwise unremarkable. Normal ureters. Normal bladder. Uterus is unremarkable. Right ovary is contiguous with right lower quadrant inflammation. No ovarian mass.  Multiple sigmoid colon diverticula. No diverticulitis. Bowel is otherwise unremarkable. No ascites.  There are degenerative changes  of the visualized spine. 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  IMPRESSION: 1. Acute appendicitis with appendiceal rupture, reflected by a small amount of extraluminal air and contrast, and a small periappendiceal abscess. 2. No other acute findings. Critical Value/emergent results were called by telephone at the time of interpretation on 06/30/2013 at 3:38 PM to Dr. Alroy Dust , who verbally acknowledged these results.   Electronically Signed   By: Lajean Manes M.D.   On: 06/30/2013 15:43    Medications / Allergies: per chart  Antibiotics: Anti-infectives   Start     Dose/Rate Route Frequency Ordered Stop   07/01/13 0800  ciprofloxacin (CIPRO) IVPB 400 mg     400 mg 200 mL/hr over 60 Minutes Intravenous Every 12 hours 06/30/13 2236     06/30/13 2300  metroNIDAZOLE (FLAGYL) IVPB 500 mg     500 mg 100 mL/hr over 60 Minutes Intravenous Every 8 hours 06/30/13 2236     06/30/13 2015  ciprofloxacin (CIPRO) IVPB 400 mg     400 mg 200 mL/hr over 60 Minutes Intravenous  Once 06/30/13 1944 06/30/13 2018      Assessment/Plan Appendicitis with perforation S/p lap appendectomy, drain Dr. Ninfa Linden 06/30/13 -continue with liquids, monitor closely for developing ileus -cipro/flagyl  -continue drain care -mobilize -IS -SCDs, lovenox -IVF, SLIV when tolerating PO -repeat CBC in AM  Erby Pian, [facility] Surgery Pager [phone] Office [phone]  07/01/2013 8:15 AM

## 2013-07-01 NOTE — Progress Notes (Signed)
Flatus, no nausea Wants to try solid food.  Will advance diet, but warned her about possible nausea.   Encourage ambulation  Wilmon ArmsMatthew K. Corliss Skainssuei, MD, [facility]  General/ Trauma Surgery  07/01/2013 4:27 PM

## 2013-07-01 NOTE — Op Note (Signed)
NAMMargaretmary Eddy:  [name], [name]               ACCOUNT NO.:  [id number]  MEDICAL RECORD NO.:  [id number]  LOCATION:  [id number]                        FACILITY:  [facility]  PHYSICIAN:  Abigail Miyamotoouglas Riki Gehring, M.D. DATE OF BIRTH:  [birth year]  DATE OF PROCEDURE:  06/30/2013 DATE OF DISCHARGE:                              OPERATIVE REPORT   PREOPERATIVE DIAGNOSIS:  Perforated appendicitis.  POSTOPERATIVE DIAGNOSIS:  Perforated appendicitis.  PROCEDURE:  Laparoscopic appendectomy.  SURGEON:  Abigail Miyamotoouglas Ismail Graziani, MD  ANESTHESIA:  General and 0.25% Marcaine.  ESTIMATED BLOOD LOSS:  Minimal.  INDICATIONS:  This is a 53 year old female who presents with several days of right-sided abdominal pain, nausea, vomiting, and diarrhea.  She had a CAT scan of the abdomen and pelvis that showed findings consistent with a perforated appendicitis.  Decision was made to proceed emergently to the operating room.  FINDINGS:  The patient was found to have an infarcted necrotic appendix which was perforated at the base with gross spillage of stool.  PROCEDURE IN DETAIL:  The patient was brought to the operating room, identified as [name].  She was placed supine on the operating room table and general anesthesia was induced.  Her abdomen was then prepped and draped in usual sterile fashion.  I made a small incision with a scalpel just below the umbilicus.  I carried this down to fascia which was then opened with a scalpel.  A hemostat was used to pass through the peritoneal cavity under direct vision.  A 0 Vicryl pursestring suture was then placed around the fascial opening.  The Hasson port was placed through the opening and insufflation of the abdomen was begun.  I placed a 5-mm port in the patient's right upper quadrant and another in the left lower quadrant all under direct vision. The omentum was found to be stuck to the cecum and appendix.  I was able to pull this free and visualized what appeared to be a  necrotic, infarcted appendix.  There was a large hole right at the base of the appendix with visible stool.  I was able to free the small bowel up off the appendix and then elevate it.  After further dissection, I was actually able to isolate the base further and get more proximal on the base in the large perforation.  I was then able to come across the base after taking down the mesoappendix with the Harmonic Scalpel with laparoscopic GIA stapler.  Once the base of the appendix was transected, it was placed in an Endosac and removed through the incision at the umbilicus.  I then copiously irrigated the abdomen with several liters of normal saline.  I made an incision in the patient's left lower quadrant and placed a 5-mm trocar there.  I then passed a 19-French Blake drain down the umbilical port and pulled it out of the right lower quadrant port removing the port.  I then placed the drain in the right pericolonic gutter adjacent to the cecum.  This was tied in place with a nylon suture.  I again visualized the area and saw no evidence of leak from the colon or damage to the colon.  I then removed all ports under direct  vision, and deflated the abdomen.  A 0 Vicryl in the umbilicus was then tied in place closing the fascial defect.  All incisions were then anesthetized with Marcaine and closed with 4-0 Monocryl sutures.  Dermabond was then applied.  The patient tolerated the procedure well.  All the counts were correct at the end of the procedure.  The patient was then extubated in the operating room and taken in stable condition to recovery room.     Abigail Miyamotoouglas Jawaan Adachi, M.D.     DB/MEDQ  D:  06/30/2013  T:  07/01/2013  Job:  [id number]

## 2013-07-02 LAB — CBC
HCT: 32.5 % — ABNORMAL LOW (ref 36.0–46.0)
HEMOGLOBIN: 11.4 g/dL — AB (ref 12.0–15.0)
MCH: 31.1 pg (ref 26.0–34.0)
MCHC: 35.1 g/dL (ref 30.0–36.0)
MCV: 88.8 fL (ref 78.0–100.0)
Platelets: 361 10*3/uL (ref 150–400)
RBC: 3.66 MIL/uL — AB (ref 3.87–5.11)
RDW: 12.7 % (ref 11.5–15.5)
WBC: 17 10*3/uL — AB (ref 4.0–10.5)

## 2013-07-02 MED ORDER — METOPROLOL SUCCINATE ER 50 MG PO TB24
50.0000 mg | ORAL_TABLET | Freq: Every day | ORAL | Status: DC
  Administered 2013-07-02 – 2013-07-09 (×8): 50 mg via ORAL
  Filled 2013-07-02 (×8): qty 1

## 2013-07-02 MED ORDER — ATORVASTATIN CALCIUM 10 MG PO TABS
10.0000 mg | ORAL_TABLET | Freq: Every day | ORAL | Status: DC
  Administered 2013-07-02 – 2013-07-08 (×7): 10 mg via ORAL
  Filled 2013-07-02 (×8): qty 1

## 2013-07-02 MED ORDER — FENOFIBRATE 160 MG PO TABS
160.0000 mg | ORAL_TABLET | Freq: Every day | ORAL | Status: DC
  Administered 2013-07-02 – 2013-07-09 (×8): 160 mg via ORAL
  Filled 2013-07-02 (×8): qty 1

## 2013-07-02 NOTE — Progress Notes (Signed)
2 Days Post-Op  Subjective: Pt doing better, pain improving.  Having good flatus.  Tolerating regular diet.  WBC still high and feels real fatigued.  Ambulating through the halls.  No BM yet.  Objective: Vital signs in last 24 hours: Temp:  [97.9 F (36.6 C)-98.9 F (37.2 C)] 98.5 F (36.9 C) (04/18 0529) Pulse Rate:  [74-101] 87 (04/18 0529) Resp:  [17-20] 17 (04/18 0529) BP: (122-160)/(54-76) 124/57 mmHg (04/18 0529) SpO2:  [91 %-95 %] 93 % (04/18 0529) Last BM Date: 06/30/13  Intake/Output from previous day: 04/17 0701 - 04/18 0700 In: 3043.3 [P.O.:1560; I.V.:1083.3; IV Piggyback:400] Out: 3075 [Urine:3000; Drains:75] Intake/Output this shift: Total I/O In: 120 [P.O.:120] Out: 300 [Urine:300]  PE: Gen:  Alert, NAD, pleasant Abd: Soft, mild tenderness, distended, Great! BS, no HSM, incisions C/D/I, drain with serosanguinous drainage (5575mL/24hours) Ext:  No erythema, edema, or tenderness   Lab Results:   Recent Labs  07/01/13 0617 07/02/13 0615  WBC 18.4* 17.0*  HGB 12.4 11.4*  HCT 35.8* 32.5*  PLT 372 361   BMET  Recent Labs  06/30/13 1734 07/01/13 0617  NA 133* 134*  K 3.9 4.2  CL 91* 99  CO2 20 19  GLUCOSE 103* 148*  BUN 14 9  CREATININE 0.69 0.59  CALCIUM 9.7 9.1   PT/INR No results found for this basename: LABPROT, INR,  in the last 72 hours CMP     Component Value Date/Time   NA 134* 07/01/2013 0617   K 4.2 07/01/2013 0617   CL 99 07/01/2013 0617   CO2 19 07/01/2013 0617   GLUCOSE 148* 07/01/2013 0617   BUN 9 07/01/2013 0617   CREATININE 0.59 07/01/2013 0617   CALCIUM 9.1 07/01/2013 0617   PROT 7.7 06/30/2013 1734   ALBUMIN 3.5 06/30/2013 1734   AST 17 06/30/2013 1734   ALT 16 06/30/2013 1734   ALKPHOS 96 06/30/2013 1734   BILITOT 0.4 06/30/2013 1734   GFRNONAA >90 07/01/2013 0617   GFRAA >90 07/01/2013 0617   Lipase  No results found for this basename: lipase       Studies/Results: Ct Abdomen Pelvis W Contrast  06/30/2013   CLINICAL DATA:   Mid abdominal pain and diarrhea.  Fever for 4 days.  EXAM: CT ABDOMEN AND PELVIS WITH CONTRAST  TECHNIQUE: Multidetector CT imaging of the abdomen and pelvis was performed using the standard protocol following bolus administration of intravenous contrast.  CONTRAST:  100mL OMNIPAQUE IOHEXOL 300 MG/ML  SOLN  COMPARISON:  None.  FINDINGS: There are inflammatory changes in the right lower quadrant. The appendix is thick walled and dilated to 12 mm. The appendix does contain some contrast and air. The appendix is discontiguous at its base, where there is a small amount of extraluminal contrast. There is also a small extraluminal collection with nondependent air adjacent to the cecal tip measuring 3 cm x 2 cm in size. Inflammatory stranding is seen in the adjacent fat. There is mild thickening of the wall of the adjacent ileum, which is presumed to be reactive. These findings are consistent with acute appendicitis and appendiceal rupture.  Minor lung base subsegmental atelectasis. The heart is normal in size.  Liver, spleen, gallbladder and pancreas are unremarkable. No bile duct dilation. No adrenal masses. Small left anterior midpole renal cyst. Kidneys otherwise unremarkable. Normal ureters. Normal bladder. Uterus is unremarkable. Right ovary is contiguous with right lower quadrant inflammation. No ovarian mass.  Multiple sigmoid colon diverticula. No diverticulitis. Bowel is otherwise unremarkable. No ascites.  There are degenerative changes of the visualized spine. 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  IMPRESSION: 1. Acute appendicitis with appendiceal rupture, reflected by a small amount of extraluminal air and contrast, and a small periappendiceal abscess. 2. No other acute findings. Critical Value/emergent results were called by telephone at the time of interpretation on 06/30/2013 at 3:38 PM to Dr. Clovis RileyMITCHELL , who verbally acknowledged these results.   Electronically Signed    By: Amie Portlandavid  Ormond M.D.   On: 06/30/2013 15:43    Anti-infectives: Anti-infectives   Start     Dose/Rate Route Frequency Ordered Stop   07/01/13 0800  ciprofloxacin (CIPRO) IVPB 400 mg     400 mg 200 mL/hr over 60 Minutes Intravenous Every 12 hours 06/30/13 2236     06/30/13 2300  metroNIDAZOLE (FLAGYL) IVPB 500 mg     500 mg 100 mL/hr over 60 Minutes Intravenous Every 8 hours 06/30/13 2236     06/30/13 2015  ciprofloxacin (CIPRO) IVPB 400 mg     400 mg 200 mL/hr over 60 Minutes Intravenous  Once 06/30/13 1944 06/30/13 2018       Assessment/Plan Appendicitis with perforation  POD #2S/p lap appendectomy, drain Dr. Magnus IvanBlackman 06/30/13  -tolerating regular diet, monitor closely for developing ileus   -IV cipro/flagyl given high WBC -continue drain care  -Mobilize and IS -SCDs, lovenox  -SLIV  -CBC minimally improved, recheck in AM -Home once WBC improved and no longer requiring IV abx -resume home meds   LOS: 2 days    Aris GeorgiaMegan Dort 07/02/2013, 9:50 AM Pager: [phone]

## 2013-07-02 NOTE — Progress Notes (Signed)
Patient called nurse to room around 2100.  She had hives breaking out on right forearm near her IV site; denied itching.  No other areas noted on trunk or legs or left arm.  Cipro was hung around 1930 and patient had one Percocet at that time.  I spoke with pharmacist who thought the hives might be related to the Cipro.  At 2330 when I hung the Flagyl, hives were gone.  Will continue to monitor.

## 2013-07-02 NOTE — Progress Notes (Signed)
Burping/belching/ +flatus. No bm  Alert, sitting in chair Soft, mild distension. Dressing intact. Drain-serosang  Cont diet as tolerated Cont IV abx for now Hopefully home Monday  Mary SellaEric M. Andrey CampanileWilson, MD, FACS General, Bariatric, & Minimally Invasive Surgery [facility], [place]

## 2013-07-03 LAB — CBC
HEMATOCRIT: 32.5 % — AB (ref 36.0–46.0)
HEMOGLOBIN: 11.5 g/dL — AB (ref 12.0–15.0)
MCH: 31.4 pg (ref 26.0–34.0)
MCHC: 35.4 g/dL (ref 30.0–36.0)
MCV: 88.8 fL (ref 78.0–100.0)
Platelets: 450 10*3/uL — ABNORMAL HIGH (ref 150–400)
RBC: 3.66 MIL/uL — AB (ref 3.87–5.11)
RDW: 12.8 % (ref 11.5–15.5)
WBC: 17.4 10*3/uL — AB (ref 4.0–10.5)

## 2013-07-03 NOTE — Progress Notes (Signed)
3 Days Post-Op  Subjective: Pt feels good.  Less abdominal pain.  No N/V tolerating reg diet.  Had a BM yesterday.  Good flatus.  No other complaints.  Objective: Vital signs in last 24 hours: Temp:  [98.1 F (36.7 C)-98.3 F (36.8 C)] 98.3 F (36.8 C) (04/19 0523) Pulse Rate:  [77-102] 77 (04/19 0523) Resp:  [16-18] 18 (04/19 0523) BP: (102-144)/(55-62) 102/55 mmHg (04/19 0523) SpO2:  [91 %-98 %] 91 % (04/19 0523) Last BM Date: 07/02/13  Intake/Output from previous day: 04/18 0701 - 04/19 0700 In: 2180 [P.O.:840; I.V.:540; IV Piggyback:800] Out: 2535 [Urine:2525; Drains:10] Intake/Output this shift: Total I/O In: -  Out: 300 [Urine:300]  PE: Gen:  Alert, NAD, pleasant Abd: Soft, NT/ND, +BS, no HSM, incisions C/D/I, drain with minimal serosanguinous drainage    Lab Results:   Recent Labs  07/02/13 0615 07/03/13 0850  WBC 17.0* 17.4*  HGB 11.4* 11.5*  HCT 32.5* 32.5*  PLT 361 450*   BMET  Recent Labs  06/30/13 1734 07/01/13 0617  NA 133* 134*  K 3.9 4.2  CL 91* 99  CO2 20 19  GLUCOSE 103* 148*  BUN 14 9  CREATININE 0.69 0.59  CALCIUM 9.7 9.1   PT/INR No results found for this basename: LABPROT, INR,  in the last 72 hours CMP     Component Value Date/Time   NA 134* 07/01/2013 0617   K 4.2 07/01/2013 0617   CL 99 07/01/2013 0617   CO2 19 07/01/2013 0617   GLUCOSE 148* 07/01/2013 0617   BUN 9 07/01/2013 0617   CREATININE 0.59 07/01/2013 0617   CALCIUM 9.1 07/01/2013 0617   PROT 7.7 06/30/2013 1734   ALBUMIN 3.5 06/30/2013 1734   AST 17 06/30/2013 1734   ALT 16 06/30/2013 1734   ALKPHOS 96 06/30/2013 1734   BILITOT 0.4 06/30/2013 1734   GFRNONAA >90 07/01/2013 0617   GFRAA >90 07/01/2013 0617   Lipase  No results found for this basename: lipase       Studies/Results: No results found.  Anti-infectives: Anti-infectives   Start     Dose/Rate Route Frequency Ordered Stop   07/01/13 0800  ciprofloxacin (CIPRO) IVPB 400 mg     400 mg 200 mL/hr over  60 Minutes Intravenous Every 12 hours 06/30/13 2236     06/30/13 2300  metroNIDAZOLE (FLAGYL) IVPB 500 mg     500 mg 100 mL/hr over 60 Minutes Intravenous Every 8 hours 06/30/13 2236     06/30/13 2015  ciprofloxacin (CIPRO) IVPB 400 mg     400 mg 200 mL/hr over 60 Minutes Intravenous  Once 06/30/13 1944 06/30/13 2018       Assessment/Plan Appendicitis with perforation  POD #3 S/p lap appendectomy, drain Dr. Magnus IvanBlackman 06/30/13  -tolerating regular diet -IV cipro/flagyl given high WBC, consider switching to zosyn, recheck CBC in am -continue drain care  -Mobilize and IS  -SCDs, lovenox  -Resume home meds -Home once WBC improved and no longer requiring IV abx     LOS: 3 days    [name] 07/03/2013, 10:55 AM Pager: [phone]

## 2013-07-03 NOTE — Progress Notes (Signed)
Doing better per pt. No n/v. +bm  Alert, nad cta Reg Soft, approp TTP; drain - empty  Cont IV abx - would like for wbc to start to trend down before d/c But since she is progressing, tolerating diet, afebrile - reassuring Tentatively plan d/c on Monday assuming wbc starts to come down  Charles SchwabEric M. Andrey CampanileWilson, MD, FACS General, Bariatric, & Minimally Invasive Surgery [facility] [place]

## 2013-07-04 ENCOUNTER — Inpatient Hospital Stay (HOSPITAL_COMMUNITY): Payer: BC Managed Care – PPO

## 2013-07-04 ENCOUNTER — Encounter (HOSPITAL_COMMUNITY): Payer: Self-pay | Admitting: Surgery

## 2013-07-04 LAB — CBC
HCT: 31.5 % — ABNORMAL LOW (ref 36.0–46.0)
HEMOGLOBIN: 10.9 g/dL — AB (ref 12.0–15.0)
MCH: 30.9 pg (ref 26.0–34.0)
MCHC: 34.6 g/dL (ref 30.0–36.0)
MCV: 89.2 fL (ref 78.0–100.0)
Platelets: 466 10*3/uL — ABNORMAL HIGH (ref 150–400)
RBC: 3.53 MIL/uL — ABNORMAL LOW (ref 3.87–5.11)
RDW: 13 % (ref 11.5–15.5)
WBC: 17 10*3/uL — ABNORMAL HIGH (ref 4.0–10.5)

## 2013-07-04 MED ORDER — LORAZEPAM 2 MG/ML IJ SOLN
1.0000 mg | INTRAMUSCULAR | Status: DC | PRN
  Administered 2013-07-04: 1 mg via INTRAVENOUS
  Filled 2013-07-04: qty 1

## 2013-07-04 MED ORDER — IOHEXOL 300 MG/ML  SOLN
80.0000 mL | Freq: Once | INTRAMUSCULAR | Status: AC | PRN
  Administered 2013-07-04: 80 mL via INTRAVENOUS

## 2013-07-04 MED ORDER — SODIUM CHLORIDE 0.9 % IV SOLN
1.0000 g | INTRAVENOUS | Status: DC
  Administered 2013-07-04 – 2013-07-06 (×3): 1 g via INTRAVENOUS
  Filled 2013-07-04 (×3): qty 1

## 2013-07-04 NOTE — Progress Notes (Signed)
Patient ID: [name], female   DOB: 1960-03-29, 53 y.o.   MRN: [id number] Request received for CT guided drainage of pelvic fluid collections/?abscesses in pt with persistent leukocytosis/pelvic pain/occ painful BM's and s/p lap appendectomy for perforated appendicitis on 06/30/13. Additional PMH as below.Imaging studies were reviewed by Dr.Henn and pt is candidate for drainage of rt ant pelvic collection as well as post left pelvic collection (via TG approach). Exam: pt awake/alert; chest- sl dim BS bases; heart- RRR; abd- soft, sl dist.,+BS, mild- mod tenderness deep pelvic regions; RLQ surgical drain intact with brown fluid in JP bulb; ext- no edema.   Filed Vitals:   07/03/13 1430 07/03/13 2304 07/04/13 0511 07/04/13 1423  BP: 125/62 123/55 139/64 151/65  Pulse: 88 86 79 80  Temp: 99.7 F (37.6 C) 98.9 F (37.2 C) 99.1 F (37.3 C) 99.7 F (37.6 C)  TempSrc: Oral Oral Oral Oral  Resp: 18 18 18 18   Height:      Weight:      SpO2: 88% 92% 91% 97%   Past Medical History  Diagnosis Date  . Hypertension   . Hypercholesteremia    Past Surgical History  Procedure Laterality Date  . Laparoscopic appendectomy N/A 06/30/2013    Procedure: APPENDECTOMY LAPAROSCOPIC;  Surgeon: Shelly Rubenstein, MD;  Location: [facility] OR;  Service: General;  Laterality: N/A;   Ct Abdomen Pelvis W Contrast  07/04/2013   CLINICAL DATA:  s/p ruptured appendicitis, lap chole with elevated WBC on antibioitics. Eval for intraabdominal abscess.  EXAM: CT ABDOMEN AND PELVIS WITH CONTRAST  TECHNIQUE: Multidetector CT imaging of the abdomen and pelvis was performed using the standard protocol following bolus administration of intravenous contrast.  CONTRAST:  80mL OMNIPAQUE IOHEXOL 300 MG/ML  SOLN  COMPARISON:  CT ABD/PELVIS W CM dated 06/30/2013  FINDINGS: Areas of increased density with linear and consolidative componentsin the lung bases. A small left pleural effusion is appreciated.  The liver, spleen, adrenals, pancreas  are unremarkable.  Small bilateral extrarenal pelves appreciated. A benign 1 cm Bosniak type 1 cyst left kidney. A very small 4 mm nonenhancing peripheral nodule posterior mid pole left kidney statistically likely a small cyst. The kidneys otherwise unremarkable.  There is no evidence of abdominal aortic aneurysm. The celiac, SMA, IMA, portal vein are opacified. The gallbladder and gallbladder fossa unremarkable.  Inflammatory changes appreciated within the intraperitoneal fat of the right pericolic gutter region. Within the lower abdomen/ upper pelvis region, image 61 series 2, a 4 x 3 cm loculated fluid collection with an air-fluid level is appreciated concerning for an abscess. A surgical drain is appreciated inferior to this finding. Postsurgical changes and a small amount of free fluid is also identified within the right pericolic gutter region.  A second 7 x 3 cm loculated fluid collection with an enhancing rim is appreciated image 77 series 2 in the posterior lower pelvis on the left, also concerning for abscess.  There are areas of wall thickening within loops of small bowel in the right lower quadrant.  Multiple distended contrast field loops of small bowel are appreciated within the abdomen.  There is no evidence of abdominal wall nor inguinal hernia. There is no evidence of pneumoperitoneum.  There are no aggressive appearing osseous lesions.  IMPRESSION: 1. 4 x 3 cm loculated fluid collection with an air-fluid level in the right lower quadrant. This finding is adjacent to the patient's postsurgical bed is concerning for an abscess. A second 7 x 3 cm loculated fluid  collection is appreciated within the posterior aspect of the pelvis on the left also concerning for an abscess. These results will be called to the ordering clinician or representative by the Radiologist Assistant, and communication documented in the PACS Dashboard. 2. Inflamed loops of distal small bowel within the right lower quadrant. There  are also findings concerning for a reactive ileus versus a partial or early small bowel obstruction. 3. Atelectasis versus infiltrate within the lung bases and a small right pleural effusion.   Electronically Signed   By: Salome HolmesHector  Cooper M.D.   On: 07/04/2013 15:01   Ct Abdomen Pelvis W Contrast  06/30/2013   CLINICAL DATA:  Mid abdominal pain and diarrhea.  Fever for 4 days.  EXAM: CT ABDOMEN AND PELVIS WITH CONTRAST  TECHNIQUE: Multidetector CT imaging of the abdomen and pelvis was performed using the standard protocol following bolus administration of intravenous contrast.  CONTRAST:  100mL OMNIPAQUE IOHEXOL 300 MG/ML  SOLN  COMPARISON:  None.  FINDINGS: There are inflammatory changes in the right lower quadrant. The appendix is thick walled and dilated to 12 mm. The appendix does contain some contrast and air. The appendix is discontiguous at its base, where there is a small amount of extraluminal contrast. There is also a small extraluminal collection with nondependent air adjacent to the cecal tip measuring 3 cm x 2 cm in size. Inflammatory stranding is seen in the adjacent fat. There is mild thickening of the wall of the adjacent ileum, which is presumed to be reactive. These findings are consistent with acute appendicitis and appendiceal rupture.  Minor lung base subsegmental atelectasis. The heart is normal in size.  Liver, spleen, gallbladder and pancreas are unremarkable. No bile duct dilation. No adrenal masses. Small left anterior midpole renal cyst. Kidneys otherwise unremarkable. Normal ureters. Normal bladder. Uterus is unremarkable. Right ovary is contiguous with right lower quadrant inflammation. No ovarian mass.  Multiple sigmoid colon diverticula. No diverticulitis. Bowel is otherwise unremarkable. No ascites.  There are degenerative changes of the visualized spine. 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  IMPRESSION: 1. Acute appendicitis with appendiceal  rupture, reflected by a small amount of extraluminal air and contrast, and a small periappendiceal abscess. 2. No other acute findings. Critical Value/emergent results were called by telephone at the time of interpretation on 06/30/2013 at 3:38 PM to Dr. Clovis RileyMITCHELL , who verbally acknowledged these results.   Electronically Signed   By: Amie Portlandavid  Ormond M.D.   On: 06/30/2013 15:43  Results for orders placed during the hospital encounter of 06/30/13  CBC WITH DIFFERENTIAL      Result Value Ref Range   WBC 16.4 (*) 4.0 - 10.5 K/uL   RBC 4.12  3.87 - 5.11 MIL/uL   Hemoglobin 13.0  12.0 - 15.0 g/dL   HCT 47.835.9 (*) 29.536.0 - 62.146.0 %   MCV 87.1  78.0 - 100.0 fL   MCH 31.6  26.0 - 34.0 pg   MCHC 36.2 (*) 30.0 - 36.0 g/dL   RDW 30.812.3  65.711.5 - 84.615.5 %   Platelets 416 (*) 150 - 400 K/uL   Neutrophils Relative % 77  43 - 77 %   Lymphocytes Relative 12  12 - 46 %   Monocytes Relative 9  3 - 12 %   Eosinophils Relative 1  0 - 5 %   Basophils Relative 1  0 - 1 %   Neutro Abs 12.5 (*) 1.7 - 7.7 K/uL   Lymphs  Abs 2.0  0.7 - 4.0 K/uL   Monocytes Absolute 1.5 (*) 0.1 - 1.0 K/uL   Eosinophils Absolute 0.2  0.0 - 0.7 K/uL   Basophils Absolute 0.2 (*) 0.0 - 0.1 K/uL   RBC Morphology POLYCHROMASIA PRESENT     WBC Morphology ATYPICAL LYMPHOCYTES     Smear Review LARGE PLATELETS PRESENT    COMPREHENSIVE METABOLIC PANEL      Result Value Ref Range   Sodium 133 (*) 137 - 147 mEq/L   Potassium 3.9  3.7 - 5.3 mEq/L   Chloride 91 (*) 96 - 112 mEq/L   CO2 20  19 - 32 mEq/L   Glucose, Bld 103 (*) 70 - 99 mg/dL   BUN 14  6 - 23 mg/dL   Creatinine, Ser 4.54  0.50 - 1.10 mg/dL   Calcium 9.7  8.4 - 09.8 mg/dL   Total Protein 7.7  6.0 - 8.3 g/dL   Albumin 3.5  3.5 - 5.2 g/dL   AST 17  0 - 37 U/L   ALT 16  0 - 35 U/L   Alkaline Phosphatase 96  39 - 117 U/L   Total Bilirubin 0.4  0.3 - 1.2 mg/dL   GFR calc non Af Amer >90  >90 mL/min   GFR calc Af Amer >90  >90 mL/min  BASIC METABOLIC PANEL      Result Value Ref Range    Sodium 134 (*) 137 - 147 mEq/L   Potassium 4.2  3.7 - 5.3 mEq/L   Chloride 99  96 - 112 mEq/L   CO2 19  19 - 32 mEq/L   Glucose, Bld 148 (*) 70 - 99 mg/dL   BUN 9  6 - 23 mg/dL   Creatinine, Ser 1.19  0.50 - 1.10 mg/dL   Calcium 9.1  8.4 - 14.7 mg/dL   GFR calc non Af Amer >90  >90 mL/min   GFR calc Af Amer >90  >90 mL/min  CBC      Result Value Ref Range   WBC 18.4 (*) 4.0 - 10.5 K/uL   RBC 4.04  3.87 - 5.11 MIL/uL   Hemoglobin 12.4  12.0 - 15.0 g/dL   HCT 82.9 (*) 56.2 - 13.0 %   MCV 88.6  78.0 - 100.0 fL   MCH 30.7  26.0 - 34.0 pg   MCHC 34.6  30.0 - 36.0 g/dL   RDW 86.5  78.4 - 69.6 %   Platelets 372  150 - 400 K/uL  CBC      Result Value Ref Range   WBC 17.0 (*) 4.0 - 10.5 K/uL   RBC 3.66 (*) 3.87 - 5.11 MIL/uL   Hemoglobin 11.4 (*) 12.0 - 15.0 g/dL   HCT 29.5 (*) 28.4 - 13.2 %   MCV 88.8  78.0 - 100.0 fL   MCH 31.1  26.0 - 34.0 pg   MCHC 35.1  30.0 - 36.0 g/dL   RDW 44.0  10.2 - 72.5 %   Platelets 361  150 - 400 K/uL  CBC      Result Value Ref Range   WBC 17.4 (*) 4.0 - 10.5 K/uL   RBC 3.66 (*) 3.87 - 5.11 MIL/uL   Hemoglobin 11.5 (*) 12.0 - 15.0 g/dL   HCT 36.6 (*) 44.0 - 34.7 %   MCV 88.8  78.0 - 100.0 fL   MCH 31.4  26.0 - 34.0 pg   MCHC 35.4  30.0 - 36.0 g/dL   RDW 42.5  95.6 -  15.5 %   Platelets 450 (*) 150 - 400 K/uL  CBC      Result Value Ref Range   WBC 17.0 (*) 4.0 - 10.5 K/uL   RBC 3.53 (*) 3.87 - 5.11 MIL/uL   Hemoglobin 10.9 (*) 12.0 - 15.0 g/dL   HCT 21.331.5 (*) 08.636.0 - 57.846.0 %   MCV 89.2  78.0 - 100.0 fL   MCH 30.9  26.0 - 34.0 pg   MCHC 34.6  30.0 - 36.0 g/dL   RDW 46.913.0  62.911.5 - 52.815.5 %   Platelets 466 (*) 150 - 400 K/uL   A/P: Pt s/p lap appendectomy 06/30/13 secondary to perforated appendicitis; now with persistent leukocytosis, pelvic pain and CT finding of fluid collections/?abscesses in RLQ/post left pelvic regions.Plan is for CT guided drainage of the pelvic fluid collections on 4/21. Details/risks of procedure d/w pt/family with their  understanding and consent.

## 2013-07-04 NOTE — Progress Notes (Signed)
4 Days Post-Op  Subjective: Afebrile, WBC still at 17.  No N/V, abdominal pain improving.  Frustrated at being sick.  Having BM's and tolerating reg diet well.  Wants to know if her WBC being high is normal for a perforated appendix.    Objective: Vital signs in last 24 hours: Temp:  [98.9 F (37.2 C)-99.7 F (37.6 C)] 99.1 F (37.3 C) (04/20 0511) Pulse Rate:  [79-88] 79 (04/20 0511) Resp:  [18] 18 (04/20 0511) BP: (123-139)/(55-64) 139/64 mmHg (04/20 0511) SpO2:  [88 %-92 %] 91 % (04/20 0511) Last BM Date: 07/02/13  Intake/Output from previous day: 04/19 0701 - 04/20 0700 In: 1320 [P.O.:720; IV Piggyback:600] Out: 700 [Urine:700] Intake/Output this shift:    PE: Gen:  Alert, NAD, pleasant Abd: Soft, mild tenderness, ND, +BS, no HSM, incisions C/D/I, drain with minimal serosanguinous drainage   Lab Results:   Recent Labs  07/03/13 0850 07/04/13 0352  WBC 17.4* 17.0*  HGB 11.5* 10.9*  HCT 32.5* 31.5*  PLT 450* 466*   BMET No results found for this basename: NA, K, CL, CO2, GLUCOSE, BUN, CREATININE, CALCIUM,  in the last 72 hours PT/INR No results found for this basename: LABPROT, INR,  in the last 72 hours CMP     Component Value Date/Time   NA 134* 07/01/2013 0617   K 4.2 07/01/2013 0617   CL 99 07/01/2013 0617   CO2 19 07/01/2013 0617   GLUCOSE 148* 07/01/2013 0617   BUN 9 07/01/2013 0617   CREATININE 0.59 07/01/2013 0617   CALCIUM 9.1 07/01/2013 0617   PROT 7.7 06/30/2013 1734   ALBUMIN 3.5 06/30/2013 1734   AST 17 06/30/2013 1734   ALT 16 06/30/2013 1734   ALKPHOS 96 06/30/2013 1734   BILITOT 0.4 06/30/2013 1734   GFRNONAA >90 07/01/2013 0617   GFRAA >90 07/01/2013 0617   Lipase  No results found for this basename: lipase       Studies/Results: No results found.  Anti-infectives: Anti-infectives   Start     Dose/Rate Route Frequency Ordered Stop   07/01/13 0800  ciprofloxacin (CIPRO) IVPB 400 mg     400 mg 200 mL/hr over 60 Minutes Intravenous Every  12 hours 06/30/13 2236     06/30/13 2300  metroNIDAZOLE (FLAGYL) IVPB 500 mg     500 mg 100 mL/hr over 60 Minutes Intravenous Every 8 hours 06/30/13 2236     06/30/13 2015  ciprofloxacin (CIPRO) IVPB 400 mg     400 mg 200 mL/hr over 60 Minutes Intravenous  Once 06/30/13 1944 06/30/13 2018     Antibiotics: Cipro Flagyl - 4/16-4/19 Invanz - 4/20 >>>   Assessment/Plan Appendicitis with perforation  POD #4 S/p lap appendectomy, drain Dr. Magnus IvanBlackman 06/30/13  -tolerating regular diet  -IV Invanz given no improvement with Cipro/flagyl, recheck CBC in am  -consider discontinuing JP drain since only putting out 10mL serosanguinous drainage/24 hours -Mobilize and IS  -SCDs, lovenox  -Home meds  -Home once WBC improved and no longer requiring IV abx      LOS: 4 days    Aris GeorgiaMegan Dort 07/04/2013, 7:39 AM Pager: [phone]

## 2013-07-04 NOTE — Progress Notes (Signed)
Patient ID: [name] GuardianSusan K Gentzler, female   DOB: [birth year], 53 y.o.   MRN: [id number]  CT scan shows multiple intra-abdominal abscesses.  She may or may not have a leak at the appendiceal stump. Discussed with IR.  Will have them attempt perc drainage. I discussed this with the patient and her husband.

## 2013-07-04 NOTE — Progress Notes (Signed)
I have seen and examined the patient and agree with the assessment and plans. Antibiotics changed.  If not significantly improved, will need a CT scan  Victoire Deans A. Magnus IvanBlackman  MD, FACS

## 2013-07-05 ENCOUNTER — Inpatient Hospital Stay (HOSPITAL_COMMUNITY): Payer: BC Managed Care – PPO

## 2013-07-05 LAB — CBC
HCT: 34.7 % — ABNORMAL LOW (ref 36.0–46.0)
Hemoglobin: 12 g/dL (ref 12.0–15.0)
MCH: 30.6 pg (ref 26.0–34.0)
MCHC: 34.6 g/dL (ref 30.0–36.0)
MCV: 88.5 fL (ref 78.0–100.0)
PLATELETS: 548 10*3/uL — AB (ref 150–400)
RBC: 3.92 MIL/uL (ref 3.87–5.11)
RDW: 12.6 % (ref 11.5–15.5)
WBC: 17.8 10*3/uL — ABNORMAL HIGH (ref 4.0–10.5)

## 2013-07-05 LAB — APTT: aPTT: 30 seconds (ref 24–37)

## 2013-07-05 LAB — PROTIME-INR
INR: 1.1 (ref 0.00–1.49)
Prothrombin Time: 14 seconds (ref 11.6–15.2)

## 2013-07-05 MED ORDER — MIDAZOLAM HCL 2 MG/2ML IJ SOLN
INTRAMUSCULAR | Status: AC
  Filled 2013-07-05: qty 4

## 2013-07-05 MED ORDER — MIDAZOLAM HCL 2 MG/2ML IJ SOLN
INTRAMUSCULAR | Status: AC | PRN
  Administered 2013-07-05 (×2): 0.5 mg via INTRAVENOUS

## 2013-07-05 MED ORDER — FENTANYL CITRATE 0.05 MG/ML IJ SOLN
INTRAMUSCULAR | Status: AC | PRN
Start: 2013-07-05 — End: 2013-07-05
  Administered 2013-07-05 (×4): 25 ug via INTRAVENOUS

## 2013-07-05 MED ORDER — LIDOCAINE-EPINEPHRINE 1 %-1:100000 IJ SOLN
INTRAMUSCULAR | Status: AC
  Filled 2013-07-05: qty 1

## 2013-07-05 MED ORDER — FENTANYL CITRATE 0.05 MG/ML IJ SOLN
INTRAMUSCULAR | Status: AC
  Filled 2013-07-05: qty 4

## 2013-07-05 MED ORDER — SODIUM CHLORIDE 0.9 % IV SOLN
INTRAVENOUS | Status: AC | PRN
  Administered 2013-07-05: 20 mL/h via INTRAVENOUS

## 2013-07-05 NOTE — Progress Notes (Signed)
5 Days Post-Op  Subjective: Pt feels okay, has been tolerating diet, having BM's.  Frustrated about her abscesses.  Awaiting perc drainage this am.  NPO.  Ambulating well.  Objective: Vital signs in last 24 hours: Temp:  [98 F (36.7 C)-99.7 F (37.6 C)] 98 F (36.7 C) (04/21 0526) Pulse Rate:  [80-91] 91 (04/21 0526) Resp:  [17-18] 17 (04/21 0526) BP: (151-160)/(65-67) 160/67 mmHg (04/21 0526) SpO2:  [94 %-97 %] 96 % (04/21 0526) Last BM Date: 07/04/13  Intake/Output from previous day: 04/20 0701 - 04/21 0700 In: 720 [P.O.:720] Out: 1960 [Urine:1950; Drains:10] Intake/Output this shift:    PE: Gen: Alert, NAD, pleasant  Abd: Soft, mild tenderness, ND, +BS, no HSM, incisions C/D/I, drain with minimal serosanguinous drainage   Lab Results:   Recent Labs  07/04/13 0352 07/05/13 0440  WBC 17.0* 17.8*  HGB 10.9* 12.0  HCT 31.5* 34.7*  PLT 466* 548*   BMET No results found for this basename: NA, K, CL, CO2, GLUCOSE, BUN, CREATININE, CALCIUM,  in the last 72 hours PT/INR  Recent Labs  07/05/13 0440  LABPROT 14.0  INR 1.10   CMP     Component Value Date/Time   NA 134* 07/01/2013 0617   K 4.2 07/01/2013 0617   CL 99 07/01/2013 0617   CO2 19 07/01/2013 0617   GLUCOSE 148* 07/01/2013 0617   BUN 9 07/01/2013 0617   CREATININE 0.59 07/01/2013 0617   CALCIUM 9.1 07/01/2013 0617   PROT 7.7 06/30/2013 1734   ALBUMIN 3.5 06/30/2013 1734   AST 17 06/30/2013 1734   ALT 16 06/30/2013 1734   ALKPHOS 96 06/30/2013 1734   BILITOT 0.4 06/30/2013 1734   GFRNONAA >90 07/01/2013 0617   GFRAA >90 07/01/2013 0617   Lipase  No results found for this basename: lipase       Studies/Results: Ct Abdomen Pelvis W Contrast  07/04/2013   CLINICAL DATA:  s/p ruptured appendicitis, lap chole with elevated WBC on antibioitics. Eval for intraabdominal abscess.  EXAM: CT ABDOMEN AND PELVIS WITH CONTRAST  TECHNIQUE: Multidetector CT imaging of the abdomen and pelvis was performed using the  standard protocol following bolus administration of intravenous contrast.  CONTRAST:  80mL OMNIPAQUE IOHEXOL 300 MG/ML  SOLN  COMPARISON:  CT ABD/PELVIS W CM dated 06/30/2013  FINDINGS: Areas of increased density with linear and consolidative componentsin the lung bases. A small left pleural effusion is appreciated.  The liver, spleen, adrenals, pancreas are unremarkable.  Small bilateral extrarenal pelves appreciated. A benign 1 cm Bosniak type 1 cyst left kidney. A very small 4 mm nonenhancing peripheral nodule posterior mid pole left kidney statistically likely a small cyst. The kidneys otherwise unremarkable.  There is no evidence of abdominal aortic aneurysm. The celiac, SMA, IMA, portal vein are opacified. The gallbladder and gallbladder fossa unremarkable.  Inflammatory changes appreciated within the intraperitoneal fat of the right pericolic gutter region. Within the lower abdomen/ upper pelvis region, image 61 series 2, a 4 x 3 cm loculated fluid collection with an air-fluid level is appreciated concerning for an abscess. A surgical drain is appreciated inferior to this finding. Postsurgical changes and a small amount of free fluid is also identified within the right pericolic gutter region.  A second 7 x 3 cm loculated fluid collection with an enhancing rim is appreciated image 77 series 2 in the posterior lower pelvis on the left, also concerning for abscess.  There are areas of wall thickening within loops of small bowel in the  right lower quadrant.  Multiple distended contrast field loops of small bowel are appreciated within the abdomen.  There is no evidence of abdominal wall nor inguinal hernia. There is no evidence of pneumoperitoneum.  There are no aggressive appearing osseous lesions.  IMPRESSION: 1. 4 x 3 cm loculated fluid collection with an air-fluid level in the right lower quadrant. This finding is adjacent to the patient's postsurgical bed is concerning for an abscess. A second 7 x 3 cm  loculated fluid collection is appreciated within the posterior aspect of the pelvis on the left also concerning for an abscess. These results will be called to the ordering clinician or representative by the Radiologist Assistant, and communication documented in the PACS Dashboard. 2. Inflamed loops of distal small bowel within the right lower quadrant. There are also findings concerning for a reactive ileus versus a partial or early small bowel obstruction. 3. Atelectasis versus infiltrate within the lung bases and a small right pleural effusion.   Electronically Signed   By: Salome HolmesHector  Cooper M.D.   On: 07/04/2013 15:01    Anti-infectives: Anti-infectives   Start     Dose/Rate Route Frequency Ordered Stop   07/04/13 0800  ertapenem (INVANZ) 1 g in sodium chloride 0.9 % 50 mL IVPB     1 g 100 mL/hr over 30 Minutes Intravenous Every 24 hours 07/04/13 0758     07/01/13 0800  ciprofloxacin (CIPRO) IVPB 400 mg  Status:  Discontinued     400 mg 200 mL/hr over 60 Minutes Intravenous Every 12 hours 06/30/13 2236 07/04/13 0758   06/30/13 2300  metroNIDAZOLE (FLAGYL) IVPB 500 mg  Status:  Discontinued     500 mg 100 mL/hr over 60 Minutes Intravenous Every 8 hours 06/30/13 2236 07/04/13 0758   06/30/13 2015  ciprofloxacin (CIPRO) IVPB 400 mg     400 mg 200 mL/hr over 60 Minutes Intravenous  Once 06/30/13 1944 06/30/13 2018     Antibiotics:  Cipro Flagyl - 4/16-4/19  Invanz - 4/20 >>>   Assessment/Plan Appendicitis with perforation  POD #5 S/p lap appendectomy, drain Dr. Magnus IvanBlackman 06/30/13  Intraabdominal abscess RLQ & Pelvis -Perc drain for today - 2 drains planned by IR -switched to NPO, but tolerating regular diet -IV Invanz given no improvement with Cipro/flagyl, recheck CBC in am  -consider discontinuing JP drain since only putting out 10mL serosanguinous drainage/24 hours  -Mobilize and IS  -SCDs, lovenox  -Home meds       LOS: 5 days    Aris GeorgiaMegan Dort 07/05/2013, 7:39 AM Pager:  [phone]

## 2013-07-05 NOTE — Procedures (Signed)
Technically successful CT guided drainage catheter placement into left hemipelvis via L TG approach yielding 30 cc of purulent material.  Technically successful CT guided drainage catheter placement into right lower abdomen yielding 20 cc of purulent material.   Samples sent to laboratory.  No immediate post procedural complications.

## 2013-07-05 NOTE — Progress Notes (Signed)
I have seen and examined the patient and agree with the assessment and plans. For IR drain today  Jylan Loeza A. Magnus IvanBlackman  MD, FACS

## 2013-07-05 NOTE — Sedation Documentation (Signed)
Report given to patient's nurse Kendal HymenBonnie on 6000

## 2013-07-05 NOTE — Sedation Documentation (Addendum)
Drain was inserted into left posterior pelvic fluid collection. Patient turned over to supine position to assess anterior right pelvic fluid collection.

## 2013-07-05 NOTE — Progress Notes (Signed)
At 2303 pt was given 1mg  Ativan IV and was wasted in pyxis, the drawer was closed and the waste is recorded as a undocumented waste but it was witnesses at the time medication was pulled from the drawer. Ilean SkillVeronica Misaki Sozio LPN

## 2013-07-05 NOTE — Sedation Documentation (Signed)
O2 d/c 

## 2013-07-06 LAB — CBC
HCT: 35.2 % — ABNORMAL LOW (ref 36.0–46.0)
HEMOGLOBIN: 12.2 g/dL (ref 12.0–15.0)
MCH: 30.7 pg (ref 26.0–34.0)
MCHC: 34.7 g/dL (ref 30.0–36.0)
MCV: 88.4 fL (ref 78.0–100.0)
Platelets: 574 10*3/uL — ABNORMAL HIGH (ref 150–400)
RBC: 3.98 MIL/uL (ref 3.87–5.11)
RDW: 12.9 % (ref 11.5–15.5)
WBC: 14.1 10*3/uL — ABNORMAL HIGH (ref 4.0–10.5)

## 2013-07-06 MED ORDER — AMOXICILLIN-POT CLAVULANATE 875-125 MG PO TABS
1.0000 | ORAL_TABLET | Freq: Two times a day (BID) | ORAL | Status: DC
  Administered 2013-07-06 – 2013-07-09 (×6): 1 via ORAL
  Filled 2013-07-06 (×9): qty 1

## 2013-07-06 NOTE — Progress Notes (Signed)
6 Days Post-Op  Subjective: Pt doing well.  Feels much better.  No N/V.  Mild pain at drain site in RLQ, buttock drain doesn't hurt.  Ambulating well and tolerating regular diet.  Drains putting put purulent drainage RLQ > buttock drain.  Objective: Vital signs in last 24 hours: Temp:  [97.9 F (36.6 C)-99 F (37.2 C)] 97.9 F (36.6 C) (04/22 0501) Pulse Rate:  [64-106] 64 (04/22 0501) Resp:  [18-27] 18 (04/22 0501) BP: (106-156)/(50-87) 131/61 mmHg (04/22 0501) SpO2:  [91 %-98 %] 96 % (04/22 0501) Last BM Date: 07/05/13  Intake/Output from previous day: 04/21 0701 - 04/22 0700 In: 1540.5 [P.O.:730; I.V.:710.5; IV Piggyback:100] Out: 98 [Drains:98] Intake/Output this shift:    PE: Gen:  Alert, NAD, pleasant Abd: Soft, mild tenderness over new drain sites, ND, +BS, no HSM, incisions C/D/I.  Left buttock drain with serosanguinous drainage with minimal purulence )20mL/24 hours).  RLQ drain with cloudy brown drainage mixed with serosanguinous drainage (62mL/24 hours).  JP drain with serosanguinous drainage 76mL/24 hours.   Lab Results:   Recent Labs  07/05/13 0440 07/06/13 0404  WBC 17.8* 14.1*  HGB 12.0 12.2  HCT 34.7* 35.2*  PLT 548* 574*   BMET No results found for this basename: NA, K, CL, CO2, GLUCOSE, BUN, CREATININE, CALCIUM,  in the last 72 hours PT/INR  Recent Labs  07/05/13 0440  LABPROT 14.0  INR 1.10   CMP     Component Value Date/Time   NA 134* 07/01/2013 0617   K 4.2 07/01/2013 0617   CL 99 07/01/2013 0617   CO2 19 07/01/2013 0617   GLUCOSE 148* 07/01/2013 0617   BUN 9 07/01/2013 0617   CREATININE 0.59 07/01/2013 0617   CALCIUM 9.1 07/01/2013 0617   PROT 7.7 06/30/2013 1734   ALBUMIN 3.5 06/30/2013 1734   AST 17 06/30/2013 1734   ALT 16 06/30/2013 1734   ALKPHOS 96 06/30/2013 1734   BILITOT 0.4 06/30/2013 1734   GFRNONAA >90 07/01/2013 0617   GFRAA >90 07/01/2013 0617   Lipase  No results found for this basename: lipase       Studies/Results: Ct  Abdomen Pelvis W Contrast  07/04/2013   CLINICAL DATA:  s/p ruptured appendicitis, lap chole with elevated WBC on antibioitics. Eval for intraabdominal abscess.  EXAM: CT ABDOMEN AND PELVIS WITH CONTRAST  TECHNIQUE: Multidetector CT imaging of the abdomen and pelvis was performed using the standard protocol following bolus administration of intravenous contrast.  CONTRAST:  80mL OMNIPAQUE IOHEXOL 300 MG/ML  SOLN  COMPARISON:  CT ABD/PELVIS W CM dated 06/30/2013  FINDINGS: Areas of increased density with linear and consolidative componentsin the lung bases. A small left pleural effusion is appreciated.  The liver, spleen, adrenals, pancreas are unremarkable.  Small bilateral extrarenal pelves appreciated. A benign 1 cm Bosniak type 1 cyst left kidney. A very small 4 mm nonenhancing peripheral nodule posterior mid pole left kidney statistically likely a small cyst. The kidneys otherwise unremarkable.  There is no evidence of abdominal aortic aneurysm. The celiac, SMA, IMA, portal vein are opacified. The gallbladder and gallbladder fossa unremarkable.  Inflammatory changes appreciated within the intraperitoneal fat of the right pericolic gutter region. Within the lower abdomen/ upper pelvis region, image 61 series 2, a 4 x 3 cm loculated fluid collection with an air-fluid level is appreciated concerning for an abscess. A surgical drain is appreciated inferior to this finding. Postsurgical changes and a small amount of free fluid is also identified within the right pericolic  gutter region.  A second 7 x 3 cm loculated fluid collection with an enhancing rim is appreciated image 77 series 2 in the posterior lower pelvis on the left, also concerning for abscess.  There are areas of wall thickening within loops of small bowel in the right lower quadrant.  Multiple distended contrast field loops of small bowel are appreciated within the abdomen.  There is no evidence of abdominal wall nor inguinal hernia. There is no  evidence of pneumoperitoneum.  There are no aggressive appearing osseous lesions.  IMPRESSION: 1. 4 x 3 cm loculated fluid collection with an air-fluid level in the right lower quadrant. This finding is adjacent to the patient's postsurgical bed is concerning for an abscess. A second 7 x 3 cm loculated fluid collection is appreciated within the posterior aspect of the pelvis on the left also concerning for an abscess. These results will be called to the ordering clinician or representative by the Radiologist Assistant, and communication documented in the PACS Dashboard. 2. Inflamed loops of distal small bowel within the right lower quadrant. There are also findings concerning for a reactive ileus versus a partial or early small bowel obstruction. 3. Atelectasis versus infiltrate within the lung bases and a small right pleural effusion.   Electronically Signed   By: Salome HolmesHector  Cooper M.D.   On: 07/04/2013 15:01   Ct Image Guided Drainage By Percutaneous Catheter  07/05/2013   INDICATION: History of perforated appendicitis, post appendectomy, now with recent imaging demonstrating fluid collections within the abdomen and lower pelvis worrisome for areas of abscess/infection.  EXAM: CT IMAGE GUIDED DRAINAGE BY PERCUTANEOUS CATHETER x2  COMPARISON:  CT ABD/PELVIS W CM dated 07/04/2013; CT ABD/PELVIS W CM dated 06/30/2013  MEDICATIONS: The patient is currently admitted to the hospital and receiving intravenous antibiotics. The antibiotics were administered within an appropriate time frame prior to the initiation of the procedure.  ANESTHESIA/SEDATION: Conscious sedation was achieved with intravenous Fentanyl and Versed  Total Moderate Sedation time  30 minutes  CONTRAST:  None  COMPLICATIONS: None immediate  PROCEDURE: Informed written consent was obtained from the patient after a discussion of the risks, benefits and alternatives to treatment.  Initially the patient was placed prone on the CT gantry and a pre procedural CT  was performed re-demonstrating the known abscess/fluid collection within the left hemipelvis measuring approximately 5.8 x 3.4 cm (image 16, series 2). The procedure was planned. A timeout was performed prior to the initiation of the procedure.  The left buttocks was prepped and draped in the usual sterile fashion. The overlying soft tissues were anesthetized with 1% lidocaine with epinephrine. Appropriate trajectory was planned with the use of a 22 gauge spinal needle. An 18 gauge trocar needle was advanced into the abscess/fluid collection and a short Amplatz super stiff wire was coiled within the collection. Appropriate positioning was confirmed with a limited CT scan. The tract was serially dilated allowing placement of a 10 [place] all-purpose drainage catheter. Appropriate positioning was confirmed with a limited postprocedural CT scan.  Approximately 30 Ml of purulent fluid was aspirated. The tube was connected to a drainage bag and sutured in place. A dressing was placed.  Next, the patient was positioned supine on the CT gantry and a limited preprocedural CT scan was performed demonstrating grossly unchanged size and location of the approximately 3.3 x 2.3 cm air and fluid containing abscess within the anterior aspect of the right lower abdomen (image 26, series 3). The skin overlying the right lower abdomen  was prepped and draped in usual sterile fashion. The overlying soft tissues were anesthetized with 1% lidocaine with epinephrine. Appropriate trajectory was planned with the use of a 22 gauge spinal needle. An 18 gauge trocar needle was advanced into the abscess/fluid collection and a short Amplatz super stiff wire was coiled within the collection. Appropriate positioning was confirmed with a limited CT scan. The tract was serially dilated allowing placement of a 10 [place] all-purpose drainage catheter. Appropriate positioning was confirmed with a limited postprocedural CT scan.  Approximately 20 cc of  purulent fluid was aspirated. The tube was connected to a gravity bag and sutured in place. A dressing was placed.  The patient tolerated the above procedures well without immediate postprocedural complication.  IMPRESSION: 1. Successful CT guided placement of a 10 [place] all purpose drain catheter into the abscess within the left hemipelvis via a left trans gluteal approach with aspiration of 30 mL of purulent fluid. Samples were sent to the laboratory as requested by the ordering clinical team. 2. Successful CT-guided placement of a 10 French all-purpose drainage catheter into the abscess within the anterior aspect of the right lower abdomen with aspiration of approximately 20 cc of purulent fluid. Samples were sent to the laboratory as requested by the ordering team.   Electronically Signed   By: Simonne Come M.D.   On: 07/05/2013 17:06   Ct Image Guided Drainage By Percutaneous Catheter  07/05/2013   INDICATION: History of perforated appendicitis, post appendectomy, now with recent imaging demonstrating fluid collections within the abdomen and lower pelvis worrisome for areas of abscess/infection.  EXAM: CT IMAGE GUIDED DRAINAGE BY PERCUTANEOUS CATHETER x2  COMPARISON:  CT ABD/PELVIS W CM dated 07/04/2013; CT ABD/PELVIS W CM dated 06/30/2013  MEDICATIONS: The patient is currently admitted to the hospital and receiving intravenous antibiotics. The antibiotics were administered within an appropriate time frame prior to the initiation of the procedure.  ANESTHESIA/SEDATION: Conscious sedation was achieved with intravenous Fentanyl and Versed  Total Moderate Sedation time  30 minutes  CONTRAST:  None  COMPLICATIONS: None immediate  PROCEDURE: Informed written consent was obtained from the patient after a discussion of the risks, benefits and alternatives to treatment.  Initially the patient was placed prone on the CT gantry and a pre procedural CT was performed re-demonstrating the known abscess/fluid collection  within the left hemipelvis measuring approximately 5.8 x 3.4 cm (image 16, series 2). The procedure was planned. A timeout was performed prior to the initiation of the procedure.  The left buttocks was prepped and draped in the usual sterile fashion. The overlying soft tissues were anesthetized with 1% lidocaine with epinephrine. Appropriate trajectory was planned with the use of a 22 gauge spinal needle. An 18 gauge trocar needle was advanced into the abscess/fluid collection and a short Amplatz super stiff wire was coiled within the collection. Appropriate positioning was confirmed with a limited CT scan. The tract was serially dilated allowing placement of a 10 [place] all-purpose drainage catheter. Appropriate positioning was confirmed with a limited postprocedural CT scan.  Approximately 30 Ml of purulent fluid was aspirated. The tube was connected to a drainage bag and sutured in place. A dressing was placed.  Next, the patient was positioned supine on the CT gantry and a limited preprocedural CT scan was performed demonstrating grossly unchanged size and location of the approximately 3.3 x 2.3 cm air and fluid containing abscess within the anterior aspect of the right lower abdomen (image 26, series 3). The skin overlying  the right lower abdomen was prepped and draped in usual sterile fashion. The overlying soft tissues were anesthetized with 1% lidocaine with epinephrine. Appropriate trajectory was planned with the use of a 22 gauge spinal needle. An 18 gauge trocar needle was advanced into the abscess/fluid collection and a short Amplatz super stiff wire was coiled within the collection. Appropriate positioning was confirmed with a limited CT scan. The tract was serially dilated allowing placement of a 10 [place] all-purpose drainage catheter. Appropriate positioning was confirmed with a limited postprocedural CT scan.  Approximately 20 cc of purulent fluid was aspirated. The tube was connected to a gravity  bag and sutured in place. A dressing was placed.  The patient tolerated the above procedures well without immediate postprocedural complication.  IMPRESSION: 1. Successful CT guided placement of a 10 [place] all purpose drain catheter into the abscess within the left hemipelvis via a left trans gluteal approach with aspiration of 30 mL of purulent fluid. Samples were sent to the laboratory as requested by the ordering clinical team. 2. Successful CT-guided placement of a 10 French all-purpose drainage catheter into the abscess within the anterior aspect of the right lower abdomen with aspiration of approximately 20 cc of purulent fluid. Samples were sent to the laboratory as requested by the ordering team.   Electronically Signed   By: Simonne ComeJohn  Watts M.D.   On: 07/05/2013 17:06    Anti-infectives: Anti-infectives   Start     Dose/Rate Route Frequency Ordered Stop   07/04/13 0800  ertapenem (INVANZ) 1 g in sodium chloride 0.9 % 50 mL IVPB     1 g 100 mL/hr over 30 Minutes Intravenous Every 24 hours 07/04/13 0758     07/01/13 0800  ciprofloxacin (CIPRO) IVPB 400 mg  Status:  Discontinued     400 mg 200 mL/hr over 60 Minutes Intravenous Every 12 hours 06/30/13 2236 07/04/13 0758   06/30/13 2300  metroNIDAZOLE (FLAGYL) IVPB 500 mg  Status:  Discontinued     500 mg 100 mL/hr over 60 Minutes Intravenous Every 8 hours 06/30/13 2236 07/04/13 0758   06/30/13 2015  ciprofloxacin (CIPRO) IVPB 400 mg     400 mg 200 mL/hr over 60 Minutes Intravenous  Once 06/30/13 1944 06/30/13 2018     Antibiotics:  IV Cipro Flagyl - 4/16-4/19  IV Invanz - 4/20 - 07/06/13 - 3 doses PO Augmentin 4/22 >>>  Assessment/Plan Appendicitis with perforation  POD #6 S/p lap appendectomy, drain Dr. Magnus IvanBlackman 06/30/13  Intraabdominal abscess RLQ & Pelvis  -s/p 2 perc drains -tolerating regular diet -IV Invanz given no improvement with Cipro/flagyl, recheck CBC in am  -D/C  JP drain given low output, keep IR drains -Mobilize and  IS  -SCDs, lovenox  -Home meds  -Will switch to PO Augmentin tonight given Cipro/flagyl didn't work well to make sure she tolerates it prior to d/c     LOS: 6 days    Aris GeorgiaMegan [name] 07/06/2013, 8:44 AM Pager: [phone]

## 2013-07-06 NOTE — Progress Notes (Signed)
Subjective: Pt feeling better today. Less abd pain Sitting up eating reg diet right now.  Objective: Physical Exam: BP 131/61  Pulse 64  Temp(Src) 97.9 F (36.6 C) (Oral)  Resp 18  Ht 5\' 3"  (1.6 m)  Wt 165 lb 14.4 oz (75.252 kg)  BMI 29.40 kg/m2  SpO2 96% RLQ drain intact, site clean, NT-thin but purulent beige output (L)TG drain intact, site clean, NT-output more serous    Labs: CBC  Recent Labs  07/05/13 0440 07/06/13 0404  WBC 17.8* 14.1*  HGB 12.0 12.2  HCT 34.7* 35.2*  PLT 548* 574*   BMET No results found for this basename: NA, K, CL, CO2, GLUCOSE, BUN, CREATININE, CALCIUM,  in the last 72 hours LFT No results found for this basename: PROT, ALBUMIN, AST, ALT, ALKPHOS, BILITOT, BILIDIR, IBILI, LIPASE,  in the last 72 hours PT/INR  Recent Labs  07/05/13 0440  LABPROT 14.0  INR 1.10     Studies/Results: Ct Abdomen Pelvis W Contrast  07/04/2013   CLINICAL DATA:  s/p ruptured appendicitis, lap chole with elevated WBC on antibioitics. Eval for intraabdominal abscess.  EXAM: CT ABDOMEN AND PELVIS WITH CONTRAST  TECHNIQUE: Multidetector CT imaging of the abdomen and pelvis was performed using the standard protocol following bolus administration of intravenous contrast.  CONTRAST:  80mL OMNIPAQUE IOHEXOL 300 MG/ML  SOLN  COMPARISON:  CT ABD/PELVIS W CM dated 06/30/2013  FINDINGS: Areas of increased density with linear and consolidative componentsin the lung bases. A small left pleural effusion is appreciated.  The liver, spleen, adrenals, pancreas are unremarkable.  Small bilateral extrarenal pelves appreciated. A benign 1 cm Bosniak type 1 cyst left kidney. A very small 4 mm nonenhancing peripheral nodule posterior mid pole left kidney statistically likely a small cyst. The kidneys otherwise unremarkable.  There is no evidence of abdominal aortic aneurysm. The celiac, SMA, IMA, portal vein are opacified. The gallbladder and gallbladder fossa unremarkable.  Inflammatory  changes appreciated within the intraperitoneal fat of the right pericolic gutter region. Within the lower abdomen/ upper pelvis region, image 61 series 2, a 4 x 3 cm loculated fluid collection with an air-fluid level is appreciated concerning for an abscess. A surgical drain is appreciated inferior to this finding. Postsurgical changes and a small amount of free fluid is also identified within the right pericolic gutter region.  A second 7 x 3 cm loculated fluid collection with an enhancing rim is appreciated image 77 series 2 in the posterior lower pelvis on the left, also concerning for abscess.  There are areas of wall thickening within loops of small bowel in the right lower quadrant.  Multiple distended contrast field loops of small bowel are appreciated within the abdomen.  There is no evidence of abdominal wall nor inguinal hernia. There is no evidence of pneumoperitoneum.  There are no aggressive appearing osseous lesions.  IMPRESSION: 1. 4 x 3 cm loculated fluid collection with an air-fluid level in the right lower quadrant. This finding is adjacent to the patient's postsurgical bed is concerning for an abscess. A second 7 x 3 cm loculated fluid collection is appreciated within the posterior aspect of the pelvis on the left also concerning for an abscess. These results will be called to the ordering clinician or representative by the Radiologist Assistant, and communication documented in the PACS Dashboard. 2. Inflamed loops of distal small bowel within the right lower quadrant. There are also findings concerning for a reactive ileus versus a partial or early small bowel obstruction. 3.  Atelectasis versus infiltrate within the lung bases and a small right pleural effusion.   Electronically Signed   By: Salome Holmes M.D.   On: 07/04/2013 15:01   Ct Image Guided Drainage By Percutaneous Catheter  07/05/2013   INDICATION: History of perforated appendicitis, post appendectomy, now with recent imaging  demonstrating fluid collections within the abdomen and lower pelvis worrisome for areas of abscess/infection.  EXAM: CT IMAGE GUIDED DRAINAGE BY PERCUTANEOUS CATHETER x2  COMPARISON:  CT ABD/PELVIS W CM dated 07/04/2013; CT ABD/PELVIS W CM dated 06/30/2013  MEDICATIONS: The patient is currently admitted to the hospital and receiving intravenous antibiotics. The antibiotics were administered within an appropriate time frame prior to the initiation of the procedure.  ANESTHESIA/SEDATION: Conscious sedation was achieved with intravenous Fentanyl and Versed  Total Moderate Sedation time  30 minutes  CONTRAST:  None  COMPLICATIONS: None immediate  PROCEDURE: Informed written consent was obtained from the patient after a discussion of the risks, benefits and alternatives to treatment.  Initially the patient was placed prone on the CT gantry and a pre procedural CT was performed re-demonstrating the known abscess/fluid collection within the left hemipelvis measuring approximately 5.8 x 3.4 cm (image 16, series 2). The procedure was planned. A timeout was performed prior to the initiation of the procedure.  The left buttocks was prepped and draped in the usual sterile fashion. The overlying soft tissues were anesthetized with 1% lidocaine with epinephrine. Appropriate trajectory was planned with the use of a 22 gauge spinal needle. An 18 gauge trocar needle was advanced into the abscess/fluid collection and a short Amplatz super stiff wire was coiled within the collection. Appropriate positioning was confirmed with a limited CT scan. The tract was serially dilated allowing placement of a 10 [place] all-purpose drainage catheter. Appropriate positioning was confirmed with a limited postprocedural CT scan.  Approximately 30 Ml of purulent fluid was aspirated. The tube was connected to a drainage bag and sutured in place. A dressing was placed.  Next, the patient was positioned supine on the CT gantry and a limited preprocedural  CT scan was performed demonstrating grossly unchanged size and location of the approximately 3.3 x 2.3 cm air and fluid containing abscess within the anterior aspect of the right lower abdomen (image 26, series 3). The skin overlying the right lower abdomen was prepped and draped in usual sterile fashion. The overlying soft tissues were anesthetized with 1% lidocaine with epinephrine. Appropriate trajectory was planned with the use of a 22 gauge spinal needle. An 18 gauge trocar needle was advanced into the abscess/fluid collection and a short Amplatz super stiff wire was coiled within the collection. Appropriate positioning was confirmed with a limited CT scan. The tract was serially dilated allowing placement of a 10 [place] all-purpose drainage catheter. Appropriate positioning was confirmed with a limited postprocedural CT scan.  Approximately 20 cc of purulent fluid was aspirated. The tube was connected to a gravity bag and sutured in place. A dressing was placed.  The patient tolerated the above procedures well without immediate postprocedural complication.  IMPRESSION: 1. Successful CT guided placement of a 10 [place] all purpose drain catheter into the abscess within the left hemipelvis via a left trans gluteal approach with aspiration of 30 mL of purulent fluid. Samples were sent to the laboratory as requested by the ordering clinical team. 2. Successful CT-guided placement of a 10 [place] all-purpose drainage catheter into the abscess within the anterior aspect of the right lower abdomen with aspiration of approximately  20 cc of purulent fluid. Samples were sent to the laboratory as requested by the ordering team.   Electronically Signed   By: Simonne ComeJohn  Watts M.D.   On: 07/05/2013 17:06   Ct Image Guided Drainage By Percutaneous Catheter  07/05/2013   INDICATION: History of perforated appendicitis, post appendectomy, now with recent imaging demonstrating fluid collections within the abdomen and lower pelvis  worrisome for areas of abscess/infection.  EXAM: CT IMAGE GUIDED DRAINAGE BY PERCUTANEOUS CATHETER x2  COMPARISON:  CT ABD/PELVIS W CM dated 07/04/2013; CT ABD/PELVIS W CM dated 06/30/2013  MEDICATIONS: The patient is currently admitted to the hospital and receiving intravenous antibiotics. The antibiotics were administered within an appropriate time frame prior to the initiation of the procedure.  ANESTHESIA/SEDATION: Conscious sedation was achieved with intravenous Fentanyl and Versed  Total Moderate Sedation time  30 minutes  CONTRAST:  None  COMPLICATIONS: None immediate  PROCEDURE: Informed written consent was obtained from the patient after a discussion of the risks, benefits and alternatives to treatment.  Initially the patient was placed prone on the CT gantry and a pre procedural CT was performed re-demonstrating the known abscess/fluid collection within the left hemipelvis measuring approximately 5.8 x 3.4 cm (image 16, series 2). The procedure was planned. A timeout was performed prior to the initiation of the procedure.  The left buttocks was prepped and draped in the usual sterile fashion. The overlying soft tissues were anesthetized with 1% lidocaine with epinephrine. Appropriate trajectory was planned with the use of a 22 gauge spinal needle. An 18 gauge trocar needle was advanced into the abscess/fluid collection and a short Amplatz super stiff wire was coiled within the collection. Appropriate positioning was confirmed with a limited CT scan. The tract was serially dilated allowing placement of a 10 [place] all-purpose drainage catheter. Appropriate positioning was confirmed with a limited postprocedural CT scan.  Approximately 30 Ml of purulent fluid was aspirated. The tube was connected to a drainage bag and sutured in place. A dressing was placed.  Next, the patient was positioned supine on the CT gantry and a limited preprocedural CT scan was performed demonstrating grossly unchanged size and  location of the approximately 3.3 x 2.3 cm air and fluid containing abscess within the anterior aspect of the right lower abdomen (image 26, series 3). The skin overlying the right lower abdomen was prepped and draped in usual sterile fashion. The overlying soft tissues were anesthetized with 1% lidocaine with epinephrine. Appropriate trajectory was planned with the use of a 22 gauge spinal needle. An 18 gauge trocar needle was advanced into the abscess/fluid collection and a short Amplatz super stiff wire was coiled within the collection. Appropriate positioning was confirmed with a limited CT scan. The tract was serially dilated allowing placement of a 10 [place] all-purpose drainage catheter. Appropriate positioning was confirmed with a limited postprocedural CT scan.  Approximately 20 cc of purulent fluid was aspirated. The tube was connected to a gravity bag and sutured in place. A dressing was placed.  The patient tolerated the above procedures well without immediate postprocedural complication.  IMPRESSION: 1. Successful CT guided placement of a 10 [place] all purpose drain catheter into the abscess within the left hemipelvis via a left trans gluteal approach with aspiration of 30 mL of purulent fluid. Samples were sent to the laboratory as requested by the ordering clinical team. 2. Successful CT-guided placement of a 10 [place] all-purpose drainage catheter into the abscess within the anterior aspect of the right lower abdomen  with aspiration of approximately 20 cc of purulent fluid. Samples were sent to the laboratory as requested by the ordering team.   Electronically Signed   By: Simonne ComeJohn  Watts M.D.   On: 07/05/2013 17:06    Assessment/Plan: S/p perc pelvic abscess drain X 2 4/21 Clinically improving Flush drains as ordered. Other care/plans per [facility]    LOS: 6 days    Brayton ElKevin Lezley Bedgood PA-C 07/06/2013 8:49 AM

## 2013-07-06 NOTE — Progress Notes (Signed)
I have seen and examined the patient and agree with the assessment and plans. I'm worried that the drainage in the RLQ drain could represent a fistula.  Clinically, she is better after drains.  Will change to oral antibiotics.  Vondell Babers A. Magnus IvanBlackman  MD, FACS

## 2013-07-07 LAB — CBC
HCT: 35.6 % — ABNORMAL LOW (ref 36.0–46.0)
Hemoglobin: 12.3 g/dL (ref 12.0–15.0)
MCH: 30.8 pg (ref 26.0–34.0)
MCHC: 34.6 g/dL (ref 30.0–36.0)
MCV: 89.2 fL (ref 78.0–100.0)
Platelets: 562 10*3/uL — ABNORMAL HIGH (ref 150–400)
RBC: 3.99 MIL/uL (ref 3.87–5.11)
RDW: 12.9 % (ref 11.5–15.5)
WBC: 12.7 10*3/uL — ABNORMAL HIGH (ref 4.0–10.5)

## 2013-07-07 NOTE — Progress Notes (Signed)
Subjective: Pt feeling better today. Less abd pain Diet being backed down to clears as stump leak is a concern  Objective: Physical Exam: BP 141/71  Pulse 68  Temp(Src) 98 F (36.7 C) (Oral)  Resp 16  Ht 5\' 3"  (1.6 m)  Wt 165 lb 14.4 oz (75.252 kg)  BMI 29.40 kg/m2  SpO2 97% RLQ drain intact, site clean, NT-thin but purulent beige output, possibly feculent (L)TG drain intact, site clean, NT-serous output, only 20mL past 24hrs.  Labs: CBC  Recent Labs  07/06/13 0404 07/07/13 0341  WBC 14.1* 12.7*  HGB 12.2 12.3  HCT 35.2* 35.6*  PLT 574* 562*   BMET No results found for this basename: NA, K, CL, CO2, GLUCOSE, BUN, CREATININE, CALCIUM,  in the last 72 hours LFT No results found for this basename: PROT, ALBUMIN, AST, ALT, ALKPHOS, BILITOT, BILIDIR, IBILI, LIPASE,  in the last 72 hours PT/INR  Recent Labs  07/05/13 0440  LABPROT 14.0  INR 1.10     Studies/Results: Ct Image Guided Drainage By Percutaneous Catheter  07/05/2013   INDICATION: History of perforated appendicitis, post appendectomy, now with recent imaging demonstrating fluid collections within the abdomen and lower pelvis worrisome for areas of abscess/infection.  EXAM: CT IMAGE GUIDED DRAINAGE BY PERCUTANEOUS CATHETER x2  COMPARISON:  CT ABD/PELVIS W CM dated 07/04/2013; CT ABD/PELVIS W CM dated 06/30/2013  MEDICATIONS: The patient is currently admitted to the hospital and receiving intravenous antibiotics. The antibiotics were administered within an appropriate time frame prior to the initiation of the procedure.  ANESTHESIA/SEDATION: Conscious sedation was achieved with intravenous Fentanyl and Versed  Total Moderate Sedation time  30 minutes  CONTRAST:  None  COMPLICATIONS: None immediate  PROCEDURE: Informed written consent was obtained from the patient after a discussion of the risks, benefits and alternatives to treatment.  Initially the patient was placed prone on the CT gantry and a pre procedural CT was  performed re-demonstrating the known abscess/fluid collection within the left hemipelvis measuring approximately 5.8 x 3.4 cm (image 16, series 2). The procedure was planned. A timeout was performed prior to the initiation of the procedure.  The left buttocks was prepped and draped in the usual sterile fashion. The overlying soft tissues were anesthetized with 1% lidocaine with epinephrine. Appropriate trajectory was planned with the use of a 22 gauge spinal needle. An 18 gauge trocar needle was advanced into the abscess/fluid collection and a short Amplatz super stiff wire was coiled within the collection. Appropriate positioning was confirmed with a limited CT scan. The tract was serially dilated allowing placement of a 10 [place] all-purpose drainage catheter. Appropriate positioning was confirmed with a limited postprocedural CT scan.  Approximately 30 Ml of purulent fluid was aspirated. The tube was connected to a drainage bag and sutured in place. A dressing was placed.  Next, the patient was positioned supine on the CT gantry and a limited preprocedural CT scan was performed demonstrating grossly unchanged size and location of the approximately 3.3 x 2.3 cm air and fluid containing abscess within the anterior aspect of the right lower abdomen (image 26, series 3). The skin overlying the right lower abdomen was prepped and draped in usual sterile fashion. The overlying soft tissues were anesthetized with 1% lidocaine with epinephrine. Appropriate trajectory was planned with the use of a 22 gauge spinal needle. An 18 gauge trocar needle was advanced into the abscess/fluid collection and a short Amplatz super stiff wire was coiled within the collection. Appropriate positioning was confirmed with  a limited CT scan. The tract was serially dilated allowing placement of a 10 [place] all-purpose drainage catheter. Appropriate positioning was confirmed with a limited postprocedural CT scan.  Approximately 20 cc of  purulent fluid was aspirated. The tube was connected to a gravity bag and sutured in place. A dressing was placed.  The patient tolerated the above procedures well without immediate postprocedural complication.  IMPRESSION: 1. Successful CT guided placement of a 10 [place] all purpose drain catheter into the abscess within the left hemipelvis via a left trans gluteal approach with aspiration of 30 mL of purulent fluid. Samples were sent to the laboratory as requested by the ordering clinical team. 2. Successful CT-guided placement of a 10 [place] all-purpose drainage catheter into the abscess within the anterior aspect of the right lower abdomen with aspiration of approximately 20 cc of purulent fluid. Samples were sent to the laboratory as requested by the ordering team.   Electronically Signed   By: Simonne ComeJohn  Watts M.D.   On: 07/05/2013 17:06   Ct Image Guided Drainage By Percutaneous Catheter  07/05/2013   INDICATION: History of perforated appendicitis, post appendectomy, now with recent imaging demonstrating fluid collections within the abdomen and lower pelvis worrisome for areas of abscess/infection.  EXAM: CT IMAGE GUIDED DRAINAGE BY PERCUTANEOUS CATHETER x2  COMPARISON:  CT ABD/PELVIS W CM dated 07/04/2013; CT ABD/PELVIS W CM dated 06/30/2013  MEDICATIONS: The patient is currently admitted to the hospital and receiving intravenous antibiotics. The antibiotics were administered within an appropriate time frame prior to the initiation of the procedure.  ANESTHESIA/SEDATION: Conscious sedation was achieved with intravenous Fentanyl and Versed  Total Moderate Sedation time  30 minutes  CONTRAST:  None  COMPLICATIONS: None immediate  PROCEDURE: Informed written consent was obtained from the patient after a discussion of the risks, benefits and alternatives to treatment.  Initially the patient was placed prone on the CT gantry and a pre procedural CT was performed re-demonstrating the known abscess/fluid collection  within the left hemipelvis measuring approximately 5.8 x 3.4 cm (image 16, series 2). The procedure was planned. A timeout was performed prior to the initiation of the procedure.  The left buttocks was prepped and draped in the usual sterile fashion. The overlying soft tissues were anesthetized with 1% lidocaine with epinephrine. Appropriate trajectory was planned with the use of a 22 gauge spinal needle. An 18 gauge trocar needle was advanced into the abscess/fluid collection and a short Amplatz super stiff wire was coiled within the collection. Appropriate positioning was confirmed with a limited CT scan. The tract was serially dilated allowing placement of a 10 [place] all-purpose drainage catheter. Appropriate positioning was confirmed with a limited postprocedural CT scan.  Approximately 30 Ml of purulent fluid was aspirated. The tube was connected to a drainage bag and sutured in place. A dressing was placed.  Next, the patient was positioned supine on the CT gantry and a limited preprocedural CT scan was performed demonstrating grossly unchanged size and location of the approximately 3.3 x 2.3 cm air and fluid containing abscess within the anterior aspect of the right lower abdomen (image 26, series 3). The skin overlying the right lower abdomen was prepped and draped in usual sterile fashion. The overlying soft tissues were anesthetized with 1% lidocaine with epinephrine. Appropriate trajectory was planned with the use of a 22 gauge spinal needle. An 18 gauge trocar needle was advanced into the abscess/fluid collection and a short Amplatz super stiff wire was coiled within the collection. Appropriate  positioning was confirmed with a limited CT scan. The tract was serially dilated allowing placement of a 10 [place] all-purpose drainage catheter. Appropriate positioning was confirmed with a limited postprocedural CT scan.  Approximately 20 cc of purulent fluid was aspirated. The tube was connected to a gravity  bag and sutured in place. A dressing was placed.  The patient tolerated the above procedures well without immediate postprocedural complication.  IMPRESSION: 1. Successful CT guided placement of a 10 [place] all purpose drain catheter into the abscess within the left hemipelvis via a left trans gluteal approach with aspiration of 30 mL of purulent fluid. Samples were sent to the laboratory as requested by the ordering clinical team. 2. Successful CT-guided placement of a 10 [place] all-purpose drainage catheter into the abscess within the anterior aspect of the right lower abdomen with aspiration of approximately 20 cc of purulent fluid. Samples were sent to the laboratory as requested by the ordering team.   Electronically Signed   By: Simonne ComeJohn  Watts M.D.   On: 07/05/2013 17:06    Assessment/Plan: S/p perc pelvic abscess drain X 2 4/21 (R)drain output suspicious for stump leak. (L)drain with minimla serous output, may be able to remove prior to dc. Flush drains as ordered. Other care/plans per [facility]    LOS: 7 days    Brayton ElKevin Analia Zuk PA-C 07/07/2013 9:55 AM

## 2013-07-07 NOTE — Progress Notes (Signed)
Patient ID: [name], female   DOB: Mar 28, 1960, 53 y.o.   MRN: [id number] 7 Days Post-Op  Subjective: Pt feels ok today.  Has pain at old drain site, otherwise doesn't have any pain.  Tolerating a regular diet with no issues.  Objective: Vital signs in last 24 hours: Temp:  [97.9 F (36.6 C)-98.6 F (37 C)] 98.6 F (37 C) (04/22 2043) Pulse Rate:  [71-74] 74 (04/22 2043) Resp:  [16] 16 (04/22 2043) BP: (126-133)/(52-66) 133/66 mmHg (04/22 2043) SpO2:  [98 %] 98 % (04/22 2043) Last BM Date: 07/05/13  Intake/Output from previous day: 04/22 0701 - 04/23 0700 In: 440 [P.O.:120; I.V.:320] Out: 65 [Drains:65] Intake/Output this shift:    PE: Abd: soft, appropriately tender, buttock drain with clear serous output.  RLQ drain with feculent appearing output.  +BS, ND  Lab Results:   Recent Labs  07/06/13 0404 07/07/13 0341  WBC 14.1* 12.7*  HGB 12.2 12.3  HCT 35.2* 35.6*  PLT 574* 562*   BMET No results found for this basename: NA, K, CL, CO2, GLUCOSE, BUN, CREATININE, CALCIUM,  in the last 72 hours PT/INR  Recent Labs  07/05/13 0440  LABPROT 14.0  INR 1.10   CMP     Component Value Date/Time   NA 134* 07/01/2013 0617   K 4.2 07/01/2013 0617   CL 99 07/01/2013 0617   CO2 19 07/01/2013 0617   GLUCOSE 148* 07/01/2013 0617   BUN 9 07/01/2013 0617   CREATININE 0.59 07/01/2013 0617   CALCIUM 9.1 07/01/2013 0617   PROT 7.7 06/30/2013 1734   ALBUMIN 3.5 06/30/2013 1734   AST 17 06/30/2013 1734   ALT 16 06/30/2013 1734   ALKPHOS 96 06/30/2013 1734   BILITOT 0.4 06/30/2013 1734   GFRNONAA >90 07/01/2013 0617   GFRAA >90 07/01/2013 0617   Lipase  No results found for this basename: lipase       Studies/Results: Ct Image Guided Drainage By Percutaneous Catheter  07/05/2013   INDICATION: History of perforated appendicitis, post appendectomy, now with recent imaging demonstrating fluid collections within the abdomen and lower pelvis worrisome for areas of  abscess/infection.  EXAM: CT IMAGE GUIDED DRAINAGE BY PERCUTANEOUS CATHETER x2  COMPARISON:  CT ABD/PELVIS W CM dated 07/04/2013; CT ABD/PELVIS W CM dated 06/30/2013  MEDICATIONS: The patient is currently admitted to the hospital and receiving intravenous antibiotics. The antibiotics were administered within an appropriate time frame prior to the initiation of the procedure.  ANESTHESIA/SEDATION: Conscious sedation was achieved with intravenous Fentanyl and Versed  Total Moderate Sedation time  30 minutes  CONTRAST:  None  COMPLICATIONS: None immediate  PROCEDURE: Informed written consent was obtained from the patient after a discussion of the risks, benefits and alternatives to treatment.  Initially the patient was placed prone on the CT gantry and a pre procedural CT was performed re-demonstrating the known abscess/fluid collection within the left hemipelvis measuring approximately 5.8 x 3.4 cm (image 16, series 2). The procedure was planned. A timeout was performed prior to the initiation of the procedure.  The left buttocks was prepped and draped in the usual sterile fashion. The overlying soft tissues were anesthetized with 1% lidocaine with epinephrine. Appropriate trajectory was planned with the use of a 22 gauge spinal needle. An 18 gauge trocar needle was advanced into the abscess/fluid collection and a short Amplatz super stiff wire was coiled within the collection. Appropriate positioning was confirmed with a limited CT scan. The tract was serially dilated allowing placement of  a 10 [place] all-purpose drainage catheter. Appropriate positioning was confirmed with a limited postprocedural CT scan.  Approximately 30 Ml of purulent fluid was aspirated. The tube was connected to a drainage bag and sutured in place. A dressing was placed.  Next, the patient was positioned supine on the CT gantry and a limited preprocedural CT scan was performed demonstrating grossly unchanged size and location of the  approximately 3.3 x 2.3 cm air and fluid containing abscess within the anterior aspect of the right lower abdomen (image 26, series 3). The skin overlying the right lower abdomen was prepped and draped in usual sterile fashion. The overlying soft tissues were anesthetized with 1% lidocaine with epinephrine. Appropriate trajectory was planned with the use of a 22 gauge spinal needle. An 18 gauge trocar needle was advanced into the abscess/fluid collection and a short Amplatz super stiff wire was coiled within the collection. Appropriate positioning was confirmed with a limited CT scan. The tract was serially dilated allowing placement of a 10 [place] all-purpose drainage catheter. Appropriate positioning was confirmed with a limited postprocedural CT scan.  Approximately 20 cc of purulent fluid was aspirated. The tube was connected to a gravity bag and sutured in place. A dressing was placed.  The patient tolerated the above procedures well without immediate postprocedural complication.  IMPRESSION: 1. Successful CT guided placement of a 10 [place] all purpose drain catheter into the abscess within the left hemipelvis via a left trans gluteal approach with aspiration of 30 mL of purulent fluid. Samples were sent to the laboratory as requested by the ordering clinical team. 2. Successful CT-guided placement of a 10 French all-purpose drainage catheter into the abscess within the anterior aspect of the right lower abdomen with aspiration of approximately 20 cc of purulent fluid. Samples were sent to the laboratory as requested by the ordering team.   Electronically Signed   By: Simonne ComeJohn  Watts M.D.   On: 07/05/2013 17:06   Ct Image Guided Drainage By Percutaneous Catheter  07/05/2013   INDICATION: History of perforated appendicitis, post appendectomy, now with recent imaging demonstrating fluid collections within the abdomen and lower pelvis worrisome for areas of abscess/infection.  EXAM: CT IMAGE GUIDED DRAINAGE BY  PERCUTANEOUS CATHETER x2  COMPARISON:  CT ABD/PELVIS W CM dated 07/04/2013; CT ABD/PELVIS W CM dated 06/30/2013  MEDICATIONS: The patient is currently admitted to the hospital and receiving intravenous antibiotics. The antibiotics were administered within an appropriate time frame prior to the initiation of the procedure.  ANESTHESIA/SEDATION: Conscious sedation was achieved with intravenous Fentanyl and Versed  Total Moderate Sedation time  30 minutes  CONTRAST:  None  COMPLICATIONS: None immediate  PROCEDURE: Informed written consent was obtained from the patient after a discussion of the risks, benefits and alternatives to treatment.  Initially the patient was placed prone on the CT gantry and a pre procedural CT was performed re-demonstrating the known abscess/fluid collection within the left hemipelvis measuring approximately 5.8 x 3.4 cm (image 16, series 2). The procedure was planned. A timeout was performed prior to the initiation of the procedure.  The left buttocks was prepped and draped in the usual sterile fashion. The overlying soft tissues were anesthetized with 1% lidocaine with epinephrine. Appropriate trajectory was planned with the use of a 22 gauge spinal needle. An 18 gauge trocar needle was advanced into the abscess/fluid collection and a short Amplatz super stiff wire was coiled within the collection. Appropriate positioning was confirmed with a limited CT scan. The tract was serially  dilated allowing placement of a 10 [place] all-purpose drainage catheter. Appropriate positioning was confirmed with a limited postprocedural CT scan.  Approximately 30 Ml of purulent fluid was aspirated. The tube was connected to a drainage bag and sutured in place. A dressing was placed.  Next, the patient was positioned supine on the CT gantry and a limited preprocedural CT scan was performed demonstrating grossly unchanged size and location of the approximately 3.3 x 2.3 cm air and fluid containing abscess within  the anterior aspect of the right lower abdomen (image 26, series 3). The skin overlying the right lower abdomen was prepped and draped in usual sterile fashion. The overlying soft tissues were anesthetized with 1% lidocaine with epinephrine. Appropriate trajectory was planned with the use of a 22 gauge spinal needle. An 18 gauge trocar needle was advanced into the abscess/fluid collection and a short Amplatz super stiff wire was coiled within the collection. Appropriate positioning was confirmed with a limited CT scan. The tract was serially dilated allowing placement of a 10 [place] all-purpose drainage catheter. Appropriate positioning was confirmed with a limited postprocedural CT scan.  Approximately 20 cc of purulent fluid was aspirated. The tube was connected to a gravity bag and sutured in place. A dressing was placed.  The patient tolerated the above procedures well without immediate postprocedural complication.  IMPRESSION: 1. Successful CT guided placement of a 10 [place] all purpose drain catheter into the abscess within the left hemipelvis via a left trans gluteal approach with aspiration of 30 mL of purulent fluid. Samples were sent to the laboratory as requested by the ordering clinical team. 2. Successful CT-guided placement of a 10 French all-purpose drainage catheter into the abscess within the anterior aspect of the right lower abdomen with aspiration of approximately 20 cc of purulent fluid. Samples were sent to the laboratory as requested by the ordering team.   Electronically Signed   By: Simonne ComeJohn  Watts M.D.   On: 07/05/2013 17:06    Anti-infectives: Anti-infectives   Start     Dose/Rate Route Frequency Ordered Stop   07/06/13 2000  amoxicillin-clavulanate (AUGMENTIN) 875-125 MG per tablet 1 tablet     1 tablet Oral Every 12 hours 07/06/13 0851     07/04/13 0800  ertapenem (INVANZ) 1 g in sodium chloride 0.9 % 50 mL IVPB  Status:  Discontinued     1 g 100 mL/hr over 30 Minutes Intravenous  Every 24 hours 07/04/13 0758 07/06/13 0851   07/01/13 0800  ciprofloxacin (CIPRO) IVPB 400 mg  Status:  Discontinued     400 mg 200 mL/hr over 60 Minutes Intravenous Every 12 hours 06/30/13 2236 07/04/13 0758   06/30/13 2300  metroNIDAZOLE (FLAGYL) IVPB 500 mg  Status:  Discontinued     500 mg 100 mL/hr over 60 Minutes Intravenous Every 8 hours 06/30/13 2236 07/04/13 0758   06/30/13 2015  ciprofloxacin (CIPRO) IVPB 400 mg     400 mg 200 mL/hr over 60 Minutes Intravenous  Once 06/30/13 1944 06/30/13 2018       Assessment/Plan  1. Appendicitis with perforation  POD #7 S/p lap appendectomy, drain Dr. 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 Will decrease her diet back to clear liquids and try to get this fistula to close with just clear liquids right now.  Will defer repeat operation and NPO/TNA for now.  May need  to transition to one of these if current treatment doesn't work -Cont oral augmentin for now    LOS: 7 days    Letha Cape 07/07/2013, 9:36 AM Pager: [phone]

## 2013-07-07 NOTE — Progress Notes (Signed)
Pt. Is complaining of some upper back itching.  She said this has happened before and thinks it is because of the sheets and when she gets hot and sweaty.  I informed pt. To let me know if the itching gets worse or if she starts having any side effects other than itching from her medications such as SOB, etc.  I also told the pt. If she still is having some itching in the morning to let MD know when he comes to see her.  Will continue to monitor and will call MD if needed. [name]

## 2013-07-07 NOTE — Progress Notes (Signed)
Patient ID: Jacqueline GuardianSusan K Neas, female   DOB: [birth year], 53 y.o.   MRN: [id number]  Given the drainage from the RLQ drain, I suspect that there is a leak from the appendix stump or the cecum.  It is controlled and her abdomen is otherwise benign. I discussed options with her.  Extremes would be exploratory laparotomy with possible partial colectomy vs complete bowel rest and TNA.  We also discussed a variation of backing down to clear liquids and seeing what the drainage does on that.  I do believe that surgery would be difficult as she is 2 weeks out from her rupture.  Will try the conservative approach for now.

## 2013-07-08 LAB — CULTURE, ROUTINE-ABSCESS
CULTURE: NO GROWTH
Special Requests: NORMAL

## 2013-07-08 LAB — CBC
HEMATOCRIT: 36.3 % (ref 36.0–46.0)
Hemoglobin: 12.2 g/dL (ref 12.0–15.0)
MCH: 30.2 pg (ref 26.0–34.0)
MCHC: 33.6 g/dL (ref 30.0–36.0)
MCV: 89.9 fL (ref 78.0–100.0)
PLATELETS: 585 10*3/uL — AB (ref 150–400)
RBC: 4.04 MIL/uL (ref 3.87–5.11)
RDW: 13 % (ref 11.5–15.5)
WBC: 11 10*3/uL — ABNORMAL HIGH (ref 4.0–10.5)

## 2013-07-08 MED ORDER — BOOST / RESOURCE BREEZE PO LIQD
1.0000 | Freq: Three times a day (TID) | ORAL | Status: DC
  Administered 2013-07-08 – 2013-07-09 (×2): 1 via ORAL

## 2013-07-08 MED ORDER — PRO-STAT SUGAR FREE PO LIQD
30.0000 mL | ORAL | Status: DC
  Administered 2013-07-08: 30 mL via ORAL
  Filled 2013-07-08 (×2): qty 30

## 2013-07-08 NOTE — Progress Notes (Signed)
INITIAL NUTRITION ASSESSMENT  DOCUMENTATION CODES Per approved criteria  -Not Applicable   INTERVENTION: Provide Resource Breeze TID with meals Provide pro-stat once daily Add multivitamin with minerals daily Diet advancement per MD RD to continue to monitor  NUTRITION DIAGNOSIS: Inadequate oral intake related to appendicitis/appendectomy as evidenced by poor po 1 week PTA and current clear liquid diet.   Goal: Pt to meet >/= 90% of their estimated nutrition needs   Monitor:  PO intake, diet advancement, weight trend, labs  Reason for Assessment: Malnutrition Screening Tool, score of 2  53 y.o. female  Admitting Dx: <principal problem not specified>  ASSESSMENT: 53 y.o. Female presents with a history of right lower quadrant abdominal pain, nausea, vomiting, and diarrhea.  POD #8 S/p lap appendectomy, drain Dr. Magnus IvanBlackman 06/30/13  Intraabdominal abscess RLQ & Pelvis  Pt has been either NPO or on a clear liquid diet for 4 out of 8 days since admission. Pt reports eating poorly for 1 week PTA due to nausea, vomiting, and abdominal pain. Today pt states she has had no appetite but, has been drinking clear liquids well. She denies any nausea today. Pt reports usual body weight of 155 to 160 lbs. Pt with 2 drains due to RLQ fistula. Discussed the importance of nutrition for wound healing. Pt agreeable to trying supplements until diet advanced.    Height: Ht Readings from Last 1 Encounters:  06/30/13 5\' 3"  (1.6 m)    Weight: Wt Readings from Last 1 Encounters:  06/30/13 165 lb 14.4 oz (75.252 kg)    Ideal Body Weight: 115 lbs  % Ideal Body Weight: 143%  Wt Readings from Last 10 Encounters:  06/30/13 165 lb 14.4 oz (75.252 kg)  06/30/13 165 lb 14.4 oz (75.252 kg)    Usual Body Weight: 155-160 lbs  % Usual Body Weight: 103%  BMI:  Body mass index is 29.4 kg/(m^2).  Estimated Nutritional Needs: Kcal: 1800-2000 Protein: 80-90 grams Fluid: 1.8-2 L/day  Skin:  closed abdominal incision with 2 closed system drains  Diet Order: Clear Liquid  EDUCATION NEEDS: -No education needs identified at this time   Intake/Output Summary (Last 24 hours) at 07/08/13 1222 Last data filed at 07/08/13 1203  Gross per 24 hour  Intake    990 ml  Output     64 ml  Net    926 ml    Last BM: 4/23  Labs:  No results found for this basename: NA, K, CL, CO2, BUN, CREATININE, CALCIUM, MG, PHOS, GLUCOSE,  in the last 168 hours  CBG (last 3)  No results found for this basename: GLUCAP,  in the last 72 hours  Scheduled Meds: . amoxicillin-clavulanate  1 tablet Oral Q12H  . atorvastatin  10 mg Oral q1800  . fenofibrate  160 mg Oral Daily  . metoprolol succinate  50 mg Oral Daily    Continuous Infusions: . 0.9 % NaCl with KCl 20 mEq / L 10 mL/hr at 07/02/13 1056    Past Medical History  Diagnosis Date  . Hypertension   . Hypercholesteremia     Past Surgical History  Procedure Laterality Date  . Laparoscopic appendectomy N/A 06/30/2013    Procedure: APPENDECTOMY LAPAROSCOPIC;  Surgeon: Shelly Rubensteinouglas A Blackman, MD;  [facility];  Service: General;  Laterality: N/A;    Ian Malkineanne Barnett RD, LDN Inpatient Clinical Dietitian Pager: [phone] After Hours Pager: [phone]

## 2013-07-08 NOTE — Progress Notes (Signed)
Subjective: She denies any significant pain, admits to soreness around drain site and minimal pain RLQ, she denies any fever or chills.   Objective: Physical Exam: BP 109/63  Pulse 67  Temp(Src) 97.8 F (36.6 C) (Oral)  Resp 20  Ht 5\' 3"  (1.6 m)  Wt 165 lb 14.4 oz (75.252 kg)  BMI 29.40 kg/m2  SpO2 98%  General: A&Ox3, NAD, sitting up in chair Abd: Soft, RLQ tenderness, RLQ drain intact, purulent/feculant output, left TG perc drain intact serous output 20cc 24 hr output  Labs: CBC  Recent Labs  07/07/13 0341 07/08/13 0330  WBC 12.7* 11.0*  HGB 12.3 12.2  HCT 35.6* 36.3  PLT 562* 585*   BMET No results found for this basename: NA, K, CL, CO2, GLUCOSE, BUN, CREATININE, CALCIUM,  in the last 72 hours LFT No results found for this basename: PROT, ALBUMIN, AST, ALT, ALKPHOS, BILITOT, BILIDIR, IBILI, LIPASE,  in the last 72 hours PT/INR No results found for this basename: LABPROT, INR,  in the last 72 hours   Studies/Results: No results found.  Assessment/Plan: Perforated appendicitis s/p appendectomy  S/p 20F drain perc placement left hemipelvis S/p 20F RLQ perc drain placement 4/21 Left hemipelvis drain with minimal output, serous fluid IR will remove tomorrow if output continues to decrease, wbc trending down, afebrile.   LOS: 8 days    Berneta LevinsKoreen D Ashley Montminy PA-C 07/08/2013 3:16 PM

## 2013-07-08 NOTE — Progress Notes (Signed)
I have seen and examined the patient and agree with the assessment and plans. I still suspect a small leak.  Cultures are pending Hopefully, will remove gluteal drain tomorrow and send home with RLQ drain and liquids  Kiptyn Rafuse A. Magnus IvanBlackman  MD, FACS

## 2013-07-08 NOTE — Progress Notes (Signed)
Patient ID: [name], female   DOB: September 13, 1960, 53 y.o.   MRN: [id number]   Subjective: Tolerating clears.  No n/v.  Afebrile.  WBC down to 11K.  Objective:  Vital signs:  Filed Vitals:   07/07/13 0940 07/07/13 1305 07/07/13 2228 07/08/13 0637  BP: 141/71 111/64 118/75 130/63  Pulse: 68 71 67 71  Temp: 98 F (36.7 C) 98.7 F (37.1 C) 98.4 F (36.9 C) 98.8 F (37.1 C)  TempSrc:  Oral Oral Oral  Resp:  18 18 18   Height:      Weight:      SpO2: 97% 98% 97% 96%    Last BM Date: 07/07/13  Intake/Output   Yesterday:  04/23 0701 - 04/24 0700 In: 30  Out: 60 [Drains:60] This shift:    I/O last 3 completed shifts: In: 350 [I.V.:320; Other:30] Out: 5975 [Drains:75]    PE:  Abd: soft, appropriately tender, buttock drain with clear serous output. RLQ drain with feculent appearing output. +BS, ND   Problem List:   Active Problems:   Perforated appendicitis    Results:   Labs: Results for orders placed during the hospital encounter of 06/30/13 (from the past 48 hour(s))  CBC     Status: Abnormal   Collection Time    07/07/13  3:41 AM      Result Value Ref Range   WBC 12.7 (*) 4.0 - 10.5 K/uL   RBC 3.99  3.87 - 5.11 MIL/uL   Hemoglobin 12.3  12.0 - 15.0 g/dL   HCT 40.935.6 (*) 81.136.0 - 91.446.0 %   MCV 89.2  78.0 - 100.0 fL   MCH 30.8  26.0 - 34.0 pg   MCHC 34.6  30.0 - 36.0 g/dL   RDW 78.212.9  95.611.5 - 21.315.5 %   Platelets 562 (*) 150 - 400 K/uL  CBC     Status: Abnormal   Collection Time    07/08/13  3:30 AM      Result Value Ref Range   WBC 11.0 (*) 4.0 - 10.5 K/uL   RBC 4.04  3.87 - 5.11 MIL/uL   Hemoglobin 12.2  12.0 - 15.0 g/dL   HCT 08.636.3  57.836.0 - 46.946.0 %   MCV 89.9  78.0 - 100.0 fL   MCH 30.2  26.0 - 34.0 pg   MCHC 33.6  30.0 - 36.0 g/dL   RDW 62.913.0  52.811.5 - 41.315.5 %   Platelets 585 (*) 150 - 400 K/uL    Imaging / Studies: No results found.  Medications / Allergies: per chart  Antibiotics: Anti-infectives   Start     Dose/Rate Route Frequency Ordered Stop    07/06/13 2000  amoxicillin-clavulanate (AUGMENTIN) 875-125 MG per tablet 1 tablet     1 tablet Oral Every 12 hours 07/06/13 0851     07/04/13 0800  ertapenem (INVANZ) 1 g in sodium chloride 0.9 % 50 mL IVPB  Status:  Discontinued     1 g 100 mL/hr over 30 Minutes Intravenous Every 24 hours 07/04/13 0758 07/06/13 0851   07/01/13 0800  ciprofloxacin (CIPRO) IVPB 400 mg  Status:  Discontinued     400 mg 200 mL/hr over 60 Minutes Intravenous Every 12 hours 06/30/13 2236 07/04/13 0758   06/30/13 2300  metroNIDAZOLE (FLAGYL) IVPB 500 mg  Status:  Discontinued     500 mg 100 mL/hr over 60 Minutes Intravenous Every 8 hours 06/30/13 2236 07/04/13 0758   06/30/13 2015  ciprofloxacin (CIPRO) IVPB 400 mg  400 mg 200 mL/hr over 60 Minutes Intravenous  Once 06/30/13 1944 06/30/13 2018      Assessment/Plan  1. Appendicitis with perforation  POD #8 S/p lap appendectomy, drain Dr. 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   Ashok NorrisEmina Leela [name], [facility] HospitalNP-BC [facility] Pager [phone]  07/08/2013 8:44 AM

## 2013-07-09 MED ORDER — OXYCODONE-ACETAMINOPHEN 5-325 MG PO TABS: 1.0000 | ORAL_TABLET | ORAL | Status: DC | PRN

## 2013-07-09 MED ORDER — FLUCONAZOLE 100 MG PO TABS: 100.0000 mg | ORAL_TABLET | Freq: Every day | ORAL | Status: DC

## 2013-07-09 MED ORDER — AMOXICILLIN-POT CLAVULANATE 875-125 MG PO TABS: 1.0000 | ORAL_TABLET | Freq: Two times a day (BID) | ORAL | Status: DC

## 2013-07-09 NOTE — Discharge Instructions (Signed)
[facility] ______[facility] LAPAROSCOPIC SURGERY: POST OP INSTRUCTIONS Always review your discharge instruction sheet given to you by the facility where your surgery was performed. IF YOU HAVE DISABILITY OR FAMILY LEAVE FORMS, YOU MUST BRING THEM TO THE OFFICE FOR PROCESSING.   DO NOT GIVE THEM TO YOUR DOCTOR.  1. A prescription for pain medication may be given to you upon discharge.  Take your pain medication as prescribed, if needed.  If narcotic pain medicine is not needed, then you may take acetaminophen (Tylenol) or ibuprofen (Advil) as needed. 2. Take your usually prescribed medications unless otherwise directed. 3. If you need a refill on your pain medication, please contact your pharmacy.  They will contact our office to request authorization. Prescriptions will not be filled after 5pm or on week-ends. 4. You should follow a light diet the first few days after arrival home, such as soup and crackers, etc.  Be sure to include lots of fluids daily. 5. Most patients will experience some swelling and bruising in the area of the incisions.  Ice packs will help.  Swelling and bruising can take several days to resolve.  6. It is common to experience some constipation if taking pain medication after surgery.  Increasing fluid intake and taking a stool softener (such as Colace) will usually help or prevent this problem from occurring.  A mild laxative (Milk of Magnesia or Miralax) should be taken according to package instructions if there are no bowel movements after 48 hours. 7. Unless discharge instructions indicate otherwise, you may remove your bandages 24-48 hours after surgery, and you may shower at that time.  You may have steri-strips (small skin tapes) in place directly over the incision.  These strips should be left on the skin for 7-10 days.  If your surgeon used skin glue on the incision, you may shower in 24 hours.  The glue will flake off over the next 2-3 weeks.  Any sutures or  staples will be removed at the office during your follow-up visit. 8. ACTIVITIES:  You may resume regular (light) daily activities beginning the next day--such as daily self-care, walking, climbing stairs--gradually increasing activities as tolerated.  You may have sexual intercourse when it is comfortable.  Refrain from any heavy lifting or straining until approved by your doctor. a. You may drive when you are no longer taking prescription pain medication, you can comfortably wear a seatbelt, and you can safely maneuver your car and apply brakes. b. RETURN TO WORK:  __________________________________________________________ 9. You should see your doctor in the office for a follow-up appointment approximately 2-3 weeks after your surgery.  Make sure that you call for this appointment within a day or two after you arrive home to insure a convenient appointment time. 10. OTHER INSTRUCTIONS: _____RECORD DRAIN OUTPUT 11. _____________________________________________________________________________________________________________________ __________________________________________________________________________________________________________________________ WHEN TO CALL YOUR DOCTOR: 1. Fever over 101.0 2. Inability to urinate 3. Continued bleeding from incision. 4. Increased pain, redness, or drainage from the incision. 5. Increasing abdominal pain  The clinic staff is available to answer your questions during regular business hours.  Please dont hesitate to call and ask to speak to one of the nurses for clinical concerns.  If you have a medical emergency, go to the nearest emergency room or call 911.  A surgeon from [facility] is always on call at the hospital. [address], [address], [place], [place]  [address] ? [address] [address], [place], [place]   [address] [phone] ? [phone] ? FAX (  [phone]) [phone] Web site: [email or web address]

## 2013-07-09 NOTE — Progress Notes (Signed)
Discharge instructions gone over with patient. Home medications gone over. Prescriptions given. Patient demonstrated how to flush and empty drain and how to change dressing. Patient is to record drainage for doctor to see. Diet, activity, and incisional care also gone over. Reasons to call the doctor discussed. Follow up appointment is to made.patient verbalized understanding of instructions.

## 2013-07-09 NOTE — Progress Notes (Signed)
9 Days Post-Op  Subjective: Doing well No complaints Minimal abdominal pain Pelvic drain with no output RLQ drain still draining but decreaseing  Objective: Vital signs in last 24 hours: Temp:  [97.8 F (36.6 C)-98.6 F (37 C)] 98.6 F (37 C) (04/25 0517) Pulse Rate:  [63-68] 68 (04/25 0517) Resp:  [18-20] 18 (04/25 0517) BP: (109-114)/(63-67) 114/67 mmHg (04/25 0517) SpO2:  [96 %-98 %] 96 % (04/25 0517) Last BM Date: 07/08/13  Intake/Output from previous day: 04/24 0701 - 04/25 0700 In: 1390 [P.O.:1380] Out: 12 [Urine:8; Drains:3; Stool:1] Intake/Output this shift: Total I/O In: 480 [P.O.:480] Out: 3 [Urine:2; Stool:1]  Abdomen soft, minimally tender  Lab Results:   Recent Labs  07/07/13 0341 07/08/13 0330  WBC 12.7* 11.0*  HGB 12.3 12.2  HCT 35.6* 36.3  PLT 562* 585*   BMET No results found for this basename: NA, K, CL, CO2, GLUCOSE, BUN, CREATININE, CALCIUM,  in the last 72 hours PT/INR No results found for this basename: LABPROT, INR,  in the last 72 hours ABG No results found for this basename: PHART, PCO2, PO2, HCO3,  in the last 72 hours  Studies/Results: No results found.  Anti-infectives: Anti-infectives   Start     Dose/Rate Route Frequency Ordered Stop   07/06/13 2000  amoxicillin-clavulanate (AUGMENTIN) 875-125 MG per tablet 1 tablet     1 tablet Oral Every 12 hours 07/06/13 0851     07/04/13 0800  ertapenem (INVANZ) 1 g in sodium chloride 0.9 % 50 mL IVPB  Status:  Discontinued     1 g 100 mL/hr over 30 Minutes Intravenous Every 24 hours 07/04/13 0758 07/06/13 0851   07/01/13 0800  ciprofloxacin (CIPRO) IVPB 400 mg  Status:  Discontinued     400 mg 200 mL/hr over 60 Minutes Intravenous Every 12 hours 06/30/13 2236 07/04/13 0758   06/30/13 2300  metroNIDAZOLE (FLAGYL) IVPB 500 mg  Status:  Discontinued     500 mg 100 mL/hr over 60 Minutes Intravenous Every 8 hours 06/30/13 2236 07/04/13 0758   06/30/13 2015  ciprofloxacin (CIPRO) IVPB 400  mg     400 mg 200 mL/hr over 60 Minutes Intravenous  Once 06/30/13 1944 06/30/13 2018      Assessment/Plan: s/p Procedure(s): APPENDECTOMY LAPAROSCOPIC (N/A)  Removed the pelvic drain Discharge home with RLQ drain.  Will see in office Monday.  Will arrange outpt CT and drain study for 1 week.  LOS: 9 days    Shelly RubensteinDouglas A Shoua Ulloa 07/09/2013

## 2013-07-09 NOTE — Discharge Summary (Signed)
Physician Discharge Summary  Patient ID: [name] MRN: [id number] DOB/AGE: 55-09-1960 55 y.o.  Admit date: 06/30/2013 Discharge date: 07/09/2013  Admission Diagnoses:  Discharge Diagnoses:  Active Problems:   Perforated appendicitis   Discharged Condition: good  Hospital Course: Patient was admitted with perforated appendicitis.  Underwent a lap appendectomy.  Had drain in place and was on IV antibiotics.  Bowel function returned quickly.  Had persistent elevated WBC.  CT scan showed abscess in the RLQ and pelvis.  Interventional Radiology was consulted and placed percutaneous drains in both collections.  The RLQ drain output developed the consistency of liquid stool.  Cultures were negative.  The output was light with around 40 cc day.  Surgery for suspected stump leak vs conservative management was discussed.  We decided to proceed conservatively.  She was doing well at the day of discharge.  The pelvic drain was removed.  Plans were arranged and she was discharged.  Consults: None  Significant Diagnostic Studies:   Treatments: surgery: lap appendectomy; IR placement of drains  Discharge Exam: Blood pressure 114/67, pulse 68, temperature 98.6 F (37 C), temperature source Oral, resp. rate 18, height 5\' 3"  (1.6 m), weight 165 lb 14.4 oz (75.252 kg), SpO2 96.00%. General appearance: alert, cooperative and no distress Resp: clear to auscultation bilaterally Cardio: regular rate and rhythm, S1, S2 normal, no murmur, click, rub or gallop Incision/Wound: abdomen soft, non tender, drain still stool tinged  Disposition: Final discharge disposition not confirmed   Future Appointments Provider Department Dept Phone   07/12/2013 9:00 AM Shelly Rubensteinouglas A Arsh Feutz, MD [facility] [place] [phone]       Medication List         amoxicillin-clavulanate 875-125 MG per tablet  Commonly known as:  AUGMENTIN  Take 1 tablet by mouth every 12 (twelve) hours.     aspirin EC 81 MG  tablet  Take 81 mg by mouth daily.     CALCIUM-VITAMIN D PO  Take 1 tablet by mouth daily.     fenofibrate 145 MG tablet  Commonly known as:  TRICOR  Take 145 mg by mouth daily.     FISH OIL PO  Take 2 capsules by mouth daily.     fluconazole 100 MG tablet  Commonly known as:  DIFLUCAN  Take 1 tablet (100 mg total) by mouth daily.     hyoscyamine 0.125 MG Tbdp disintergrating tablet  Commonly known as:  ANASPAZ  Place 0.125 mg under the tongue 3 (three) times daily as needed for cramping.     LIPITOR 10 MG tablet  Generic drug:  atorvastatin  Take 10 mg by mouth daily.     MAGNESIUM PO  Take 1 tablet by mouth daily.     metoprolol succinate 50 MG 24 hr tablet  Commonly known as:  TOPROL-XL  Take 50 mg by mouth daily. Take with or immediately following a meal.     multivitamin with minerals Tabs tablet  Take 1 tablet by mouth daily.     OVER THE COUNTER MEDICATION  Take 1 capsule by mouth daily. Dr Neil CrouchZ recommended medication. Name of medication is unknown.     oxyCODONE-acetaminophen 5-325 MG per tablet  Commonly known as:  PERCOCET/ROXICET  Take 1-2 tablets by mouth every 4 (four) hours as needed for moderate pain.     VITAMIN B COMPLEX PO  Take 1 tablet by mouth daily.     VITAMIN C PO  Take 1 tablet by mouth daily.  VITAMIN E PO  Take 1 capsule by mouth daily.           Follow-up Information   [facility] On 07/11/2013. (call at 9 am and we will work you in)    Specialty:  General Surgery   Contact information:   [address] [place] [address] [phone]       Signed: Shelly RubensteinDouglas A Neela Zecca 07/09/2013, 10:35 AM

## 2013-07-11 ENCOUNTER — Ambulatory Visit (INDEPENDENT_AMBULATORY_CARE_PROVIDER_SITE_OTHER): Payer: BC Managed Care – PPO | Admitting: Surgery

## 2013-07-11 ENCOUNTER — Encounter (INDEPENDENT_AMBULATORY_CARE_PROVIDER_SITE_OTHER): Payer: Self-pay | Admitting: Surgery

## 2013-07-11 VITALS — BP 122/72 | HR 68 | Temp 97.0°F | Resp 14 | Ht 63.0 in | Wt 147.2 lb

## 2013-07-11 DIAGNOSIS — Z09 Encounter for follow-up examination after completed treatment for conditions other than malignant neoplasm: Secondary | ICD-10-CM

## 2013-07-11 DIAGNOSIS — T8183XA Persistent postprocedural fistula, initial encounter: Secondary | ICD-10-CM

## 2013-07-11 NOTE — Progress Notes (Signed)
Subjective:     Patient ID: [name], female   DOB: 04-May-1960, 53 y.o.   MRN: [id number]  HPI She is here for a postoperative followup. She had a laparoscopic appendectomy performed for severe perforated appendicitis. A week after surgery, a CAT scan was performed showing 2 intra-abdominal abscesses which were drained.  A drain in the right lower quadrant appeared consistent with stool. She is currently home on a clear liquid diet. The drain output is now decreasing. It put out only 5 cc over the last 12 hours. She denies any fevers. She has normal bowel movements  Review of Systems     Objective:   Physical Exam On exam, the drainage output is much clearer. Her abdomen is soft and nontender    Assessment:     Patient stable postop     Plan:     We will continue to drain and antibiotics for now. We will get a drain study by interventional radiology next week to see if there is a fistula. If the fistula persists we will have to leave a drain. Hopefully it'll continue to close. I'll see her back next week after the drain study

## 2013-07-11 NOTE — Addendum Note (Signed)
Addended by: Maryan PulsMOORE, Marguerette Sheller on: 07/11/2013 10:27 AM   Modules accepted: Orders

## 2013-07-12 ENCOUNTER — Encounter (INDEPENDENT_AMBULATORY_CARE_PROVIDER_SITE_OTHER): Payer: BC Managed Care – PPO | Admitting: Surgery

## 2013-07-17 ENCOUNTER — Other Ambulatory Visit (INDEPENDENT_AMBULATORY_CARE_PROVIDER_SITE_OTHER): Payer: Self-pay | Admitting: General Surgery

## 2013-07-17 ENCOUNTER — Telehealth (INDEPENDENT_AMBULATORY_CARE_PROVIDER_SITE_OTHER): Payer: Self-pay | Admitting: General Surgery

## 2013-07-17 MED ORDER — ONDANSETRON HCL 4 MG PO TABS: 4.0000 mg | ORAL_TABLET | ORAL | Status: DC | PRN

## 2013-07-17 NOTE — Telephone Encounter (Signed)
Chart reviewed. She is on a liquid diet and has a drain in for a fistula. She called stating that she woke up at 0400 and has had 4 episodes of N/V.  Her bowels are still moving. No fever.  Drain not putting out much.  She is due for a repeat CT tomorrow.  Since her last episode of vomiting, she has been able to keep down water.  I called in Zofran for her, but told her if she continued to have the vomiting, she should go to the ED.

## 2013-07-18 ENCOUNTER — Ambulatory Visit (HOSPITAL_COMMUNITY)
Admission: RE | Admit: 2013-07-18 | Discharge: 2013-07-18 | Disposition: A | Discharge status: Home / Self Care | Payer: BC Managed Care – PPO | Source: Ambulatory Visit | Attending: Surgery | Admitting: Surgery

## 2013-07-18 DIAGNOSIS — K37 Unspecified appendicitis: Secondary | ICD-10-CM | POA: Insufficient documentation

## 2013-07-18 DIAGNOSIS — Z09 Encounter for follow-up examination after completed treatment for conditions other than malignant neoplasm: Secondary | ICD-10-CM

## 2013-07-18 DIAGNOSIS — K651 Peritoneal abscess: Secondary | ICD-10-CM | POA: Insufficient documentation

## 2013-07-18 DIAGNOSIS — T8183XA Persistent postprocedural fistula, initial encounter: Secondary | ICD-10-CM

## 2013-07-18 MED ORDER — IOHEXOL 300 MG/ML  SOLN
50.0000 mL | Freq: Once | INTRAMUSCULAR | Status: AC | PRN
  Administered 2013-07-18: 10 mL

## 2013-07-18 NOTE — Procedures (Signed)
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

## 2013-07-20 ENCOUNTER — Ambulatory Visit (INDEPENDENT_AMBULATORY_CARE_PROVIDER_SITE_OTHER): Payer: BC Managed Care – PPO | Admitting: Surgery

## 2013-07-20 ENCOUNTER — Encounter (INDEPENDENT_AMBULATORY_CARE_PROVIDER_SITE_OTHER): Payer: Self-pay | Admitting: Surgery

## 2013-07-20 VITALS — BP 111/83 | HR 70 | Temp 98.1°F | Resp 18 | Ht 63.0 in | Wt 140.8 lb

## 2013-07-20 DIAGNOSIS — Z09 Encounter for follow-up examination after completed treatment for conditions other than malignant neoplasm: Secondary | ICD-10-CM

## 2013-07-20 MED ORDER — OXYCODONE-ACETAMINOPHEN 5-325 MG PO TABS: 1.0000 | ORAL_TABLET | ORAL | Status: DC | PRN

## 2013-07-20 NOTE — Progress Notes (Signed)
Subjective:     Patient ID: [name], female   DOB: March 19, 1960, 53 y.o.   MRN: [id number]  HPI She is here for another postoperative visit.  Other than some mild right lower quadrant pain and a couple episodes of emesis several days ago, she is doing fairly well. She remains on a liquid diet. She is having normal bowel movements. She and her husband have been flushing it drained and there has been no output from the drain. They report that it easily flushed with saline.  Review of Systems     Objective:   Physical Exam On exam, she is well in appearance. Her abdomen is soft and nontender. There is only clear fluid in the drain.  The drain study from 2 days ago still demonstrates a small fistula tract going into what appears to be the appendiceal stump    Assessment:     Postoperative appendiceal stump leak     Plan:     Given the fact that there is been no stool in the drain, I believe the fistula tract is quite small and is continuing to heal. As she is doing well, she will remain on a liquid diet and I will see her  back in 2 weeks. I will then plan another drain study after that. Should she develop any fever, I will need to get a CAT scan of her abdomen and pelvis to rule out a secondary abscess.

## 2013-08-02 ENCOUNTER — Other Ambulatory Visit (INDEPENDENT_AMBULATORY_CARE_PROVIDER_SITE_OTHER): Payer: Self-pay | Admitting: Surgery

## 2013-08-02 ENCOUNTER — Ambulatory Visit (INDEPENDENT_AMBULATORY_CARE_PROVIDER_SITE_OTHER): Payer: BC Managed Care – PPO | Admitting: Surgery

## 2013-08-02 ENCOUNTER — Encounter (INDEPENDENT_AMBULATORY_CARE_PROVIDER_SITE_OTHER): Payer: Self-pay | Admitting: Surgery

## 2013-08-02 VITALS — BP 122/76 | HR 80 | Temp 97.2°F | Resp 14 | Wt 137.0 lb

## 2013-08-02 DIAGNOSIS — Z09 Encounter for follow-up examination after completed treatment for conditions other than malignant neoplasm: Secondary | ICD-10-CM

## 2013-08-02 DIAGNOSIS — L0291 Cutaneous abscess, unspecified: Secondary | ICD-10-CM

## 2013-08-02 NOTE — Progress Notes (Signed)
Subjective:     Patient ID: [name], female   DOB: February 28, 1961, 53 y.o.   MRN: [id number]  HPI She is here for another post op visit s/p lap appendectomy with post op appendiceal stump leak.  Her last drain study was 5/4.  She reports that she is doing well.  She has no abdominal pain, her BM's are normal, and there has been no drain output  Review of Systems     Objective:   Physical Exam Her abdomen is soft and nontender and her drain as no output    Assessment:     Stable post op     Plan:     We will plan another drain study next week.  If there is no fistula, we will pull the drain.  If a fistula persists, we will plan surgery

## 2013-08-09 ENCOUNTER — Encounter (INDEPENDENT_AMBULATORY_CARE_PROVIDER_SITE_OTHER): Payer: Self-pay | Admitting: Surgery

## 2013-08-09 ENCOUNTER — Ambulatory Visit (INDEPENDENT_AMBULATORY_CARE_PROVIDER_SITE_OTHER): Payer: BC Managed Care – PPO | Admitting: Surgery

## 2013-08-09 ENCOUNTER — Ambulatory Visit (HOSPITAL_COMMUNITY)
Admission: RE | Admit: 2013-08-09 | Discharge: 2013-08-09 | Disposition: A | Discharge status: Home / Self Care | Payer: BC Managed Care – PPO | Source: Ambulatory Visit | Attending: Surgery | Admitting: Surgery

## 2013-08-09 VITALS — BP 124/62 | HR 70 | Temp 98.0°F | Resp 18 | Ht 63.0 in | Wt 137.0 lb

## 2013-08-09 DIAGNOSIS — Z09 Encounter for follow-up examination after completed treatment for conditions other than malignant neoplasm: Secondary | ICD-10-CM

## 2013-08-09 DIAGNOSIS — L0291 Cutaneous abscess, unspecified: Secondary | ICD-10-CM

## 2013-08-09 DIAGNOSIS — Z4682 Encounter for fitting and adjustment of non-vascular catheter: Secondary | ICD-10-CM | POA: Insufficient documentation

## 2013-08-09 MED ORDER — IOHEXOL 300 MG/ML  SOLN
20.0000 mL | Freq: Once | INTRAMUSCULAR | Status: AC | PRN
  Administered 2013-08-09: 20 mL

## 2013-08-09 NOTE — Progress Notes (Signed)
Subjective:     Patient ID: [name], female   DOB: 11-10-1960, 53 y.o.   MRN: [id number]  HPI She is here for her followup visit after removal of her drain earlier today. She had a drain study which showed no further communication with the colon and no obvious fistula. She is doing well and has been off of antibiotics for several weeks. She is getting some regular food and having normal bowel movements  Review of Systems     Objective:   Physical Examon exam, her abdomen is soft and nontender     Assessment:     Patient stable postop     Plan:     She may resume her normal activity including regular food. I will see her back if she develops any abdominal pain or fever. If she does, we will get a CAT scan of her abdomen and pelvis

## 2013-10-13 ENCOUNTER — Other Ambulatory Visit: Payer: Self-pay | Admitting: Gynecology

## 2013-10-14 LAB — CYTOLOGY - PAP

## 2014-09-05 ENCOUNTER — Ambulatory Visit (INDEPENDENT_AMBULATORY_CARE_PROVIDER_SITE_OTHER): Payer: BLUE CROSS/BLUE SHIELD | Admitting: Internal Medicine

## 2014-09-05 ENCOUNTER — Encounter (INDEPENDENT_AMBULATORY_CARE_PROVIDER_SITE_OTHER): Payer: Self-pay

## 2014-09-05 ENCOUNTER — Encounter: Payer: Self-pay | Admitting: Internal Medicine

## 2014-09-05 VITALS — BP 140/80 | HR 81 | Temp 98.2°F | Ht 63.0 in | Wt 156.0 lb

## 2014-09-05 DIAGNOSIS — J301 Allergic rhinitis due to pollen: Secondary | ICD-10-CM

## 2014-09-05 DIAGNOSIS — R000 Tachycardia, unspecified: Secondary | ICD-10-CM | POA: Diagnosis not present

## 2014-09-05 DIAGNOSIS — I10 Essential (primary) hypertension: Secondary | ICD-10-CM

## 2014-09-05 DIAGNOSIS — E7800 Pure hypercholesterolemia, unspecified: Secondary | ICD-10-CM

## 2014-09-05 LAB — CBC WITH DIFFERENTIAL/PLATELET
Basophils Absolute: 0.1 10*3/uL (ref 0.0–0.1)
Basophils Relative: 1.4 % (ref 0.0–3.0)
EOS ABS: 0.2 10*3/uL (ref 0.0–0.7)
Eosinophils Relative: 1.7 % (ref 0.0–5.0)
HCT: 39.8 % (ref 36.0–46.0)
HEMOGLOBIN: 13.4 g/dL (ref 12.0–15.0)
LYMPHS PCT: 27.3 % (ref 12.0–46.0)
Lymphs Abs: 2.8 10*3/uL (ref 0.7–4.0)
MCHC: 33.8 g/dL (ref 30.0–36.0)
MCV: 91.5 fl (ref 78.0–100.0)
Monocytes Absolute: 0.6 10*3/uL (ref 0.1–1.0)
Monocytes Relative: 6 % (ref 3.0–12.0)
NEUTROS ABS: 6.5 10*3/uL (ref 1.4–7.7)
Neutrophils Relative %: 63.6 % (ref 43.0–77.0)
Platelets: 323 10*3/uL (ref 150.0–400.0)
RBC: 4.34 Mil/uL (ref 3.87–5.11)
RDW: 14.3 % (ref 11.5–15.5)
WBC: 10.2 10*3/uL (ref 4.0–10.5)

## 2014-09-05 LAB — COMPREHENSIVE METABOLIC PANEL
ALBUMIN: 4.6 g/dL (ref 3.5–5.2)
ALT: 17 U/L (ref 0–35)
AST: 20 U/L (ref 0–37)
Alkaline Phosphatase: 68 U/L (ref 39–117)
BILIRUBIN TOTAL: 0.4 mg/dL (ref 0.2–1.2)
BUN: 10 mg/dL (ref 6–23)
CALCIUM: 10.1 mg/dL (ref 8.4–10.5)
CO2: 25 meq/L (ref 19–32)
Chloride: 105 mEq/L (ref 96–112)
Creatinine, Ser: 0.74 mg/dL (ref 0.40–1.20)
GFR: 86.39 mL/min (ref >60.00)
GLUCOSE: 78 mg/dL (ref 70–99)
Potassium: 3.8 mEq/L (ref 3.5–5.1)
Sodium: 138 mEq/L (ref 135–145)
Total Protein: 7.5 g/dL (ref 6.0–8.3)

## 2014-09-05 LAB — POCT URINALYSIS DIPSTICK
BILIRUBIN UA: NEGATIVE
GLUCOSE UA: NEGATIVE
Ketones, UA: NEGATIVE
Leukocytes, UA: NEGATIVE
Nitrite, UA: NEGATIVE
Protein, UA: NEGATIVE
RBC UA: NEGATIVE
SPEC GRAV UA: 1.02
Urobilinogen, UA: NEGATIVE
pH, UA: 6.5

## 2014-09-05 LAB — LIPID PANEL
Cholesterol: 177 mg/dL (ref 0–200)
HDL: 57.5 mg/dL (ref >39.00)
LDL Cholesterol: 84 mg/dL (ref 0–99)
NONHDL: 119.5
TRIGLYCERIDES: 176 mg/dL — AB (ref 0.0–149.0)
Total CHOL/HDL Ratio: 3
VLDL: 35.2 mg/dL (ref 0.0–40.0)

## 2014-09-05 LAB — T4, FREE: FREE T4: 0.98 ng/dL (ref 0.60–1.60)

## 2014-09-05 NOTE — Assessment & Plan Note (Signed)
No recurrence since on the beta blocker

## 2014-09-05 NOTE — Assessment & Plan Note (Signed)
Discussed the fenofibrate---if triglycerides not that high, I think they can stop Will continue the statin for primary prevention

## 2014-09-05 NOTE — Assessment & Plan Note (Signed)
Does well with loratadine OTC

## 2014-09-05 NOTE — Progress Notes (Signed)
Pre visit review using our clinic review tool, if applicable. No additional management support is needed unless otherwise documented below in the visit note. 

## 2014-09-05 NOTE — Progress Notes (Signed)
Subjective:    Patient ID: [name], female    DOB: [birth year], 54 y.o.   MRN: [id number]  HPI Here to establish Wants to switch due to missed perforated appendicitis last year by primary doctor Widowed just in April--husband had lung cancer Working on trying to stop smoking--working on the mindset 10-12 per day. Knows she has to change routine  HTN diagnosed some years ago No problems with the beta blocker 2 episodes of tachycardia in past-- ER evaluation done  On statin for cholesterol Started on the fenofibrate due to elevated triglycerides also  Going in August for gyn and mammogram Colonoscopy 2011--- told 10 year follow up  Allergies to pollen Uses loratadine as needed and is satisfied with that  Current Outpatient Prescriptions on File Prior to Visit  Medication Sig Dispense Refill  . Ascorbic Acid (VITAMIN C PO) Take 1 tablet by mouth daily.    Marland Kitchen aspirin EC 81 MG tablet Take 81 mg by mouth daily.    Marland Kitchen atorvastatin (LIPITOR) 10 MG tablet Take 10 mg by mouth daily.    . B Complex Vitamins (VITAMIN B COMPLEX PO) Take 1 tablet by mouth daily.    Marland Kitchen CALCIUM-VITAMIN D PO Take 1 tablet by mouth daily.    . fenofibrate (TRICOR) 145 MG tablet Take 145 mg by mouth daily.    Marland Kitchen MAGNESIUM PO Take 1 tablet by mouth daily.    . metoprolol succinate (TOPROL-XL) 50 MG 24 hr tablet Take 50 mg by mouth daily. Take with or immediately following a meal.    . Multiple Vitamin (MULTIVITAMIN WITH MINERALS) TABS tablet Take 1 tablet by mouth daily.    . Omega-3 Fatty Acids (FISH OIL PO) Take 2 capsules by mouth daily.    Marland Kitchen triamcinolone cream (KENALOG) 0.1 %     . VITAMIN E PO Take 1 capsule by mouth daily.     No current facility-administered medications on file prior to visit.    Allergies  Allergen Reactions  . Cephalexin Hives and Rash    Past Medical History  Diagnosis Date  . Hypercholesteremia   . Hypertension   . Tachycardia     likely brief SVT  . Allergic  rhinitis due to pollen     Past Surgical History  Procedure Laterality Date  . Laparoscopic appendectomy N/A 06/30/2013    Procedure: APPENDECTOMY LAPAROSCOPIC;  Surgeon: Shelly Rubenstein, MD;  Location: [facility] OR;  Service: General;  Laterality: N/A;    Family History  Problem Relation Age of Onset  . Heart disease Mother   . Diabetes Mother   . Arthritis Father   . Hyperlipidemia Sister   . Obesity Brother   . Cancer Maternal Grandmother     melanoma  . Heart disease Paternal Grandfather     History   Social History  . Marital Status: Widowed    Spouse Name: N/A  . Number of Children: 2  . Years of Education: N/A   Occupational History  . housewife    Social History Main Topics  . Smoking status: Current Every Day Smoker  . Smokeless tobacco: Never Used  . Alcohol Use: 0.0 oz/week    0 Standard drinks or equivalent per week     Comment: rare alcohol  . Drug Use: No  . Sexual Activity: Not on file   Other Topics Concern  . Not on file   Social History Narrative   2 daughters   Review of Systems  Constitutional: Negative for fatigue  and unexpected weight change.       Tries to walk and work in garden  HENT: Negative for hearing loss and tinnitus.   Eyes: Negative for visual disturbance.  Respiratory: Negative for cough, chest tightness and shortness of breath.   Cardiovascular: Positive for palpitations. Negative for chest pain and leg swelling.  Gastrointestinal: Negative for abdominal pain and blood in stool.  Genitourinary:       2 UTIs this year--gone to urgent care for this. No dysuria but does have some urgency when this has happened. Menopause in late 40's  Musculoskeletal: Positive for back pain. Negative for joint swelling and arthralgias.       Mild backache if tired  Skin:       Photosensitive--uses sunscreen  Neurological: Negative for dizziness, syncope and light-headedness.  Hematological: Negative for adenopathy. Bruises/bleeds easily.    Psychiatric/Behavioral: Negative for sleep disturbance.       Adjusting to husband's death       Objective:   Physical Exam  Constitutional: She appears well-developed and well-nourished. No distress.  HENT:  Mouth/Throat: Oropharynx is clear and moist. No oropharyngeal exudate.  Neck: Normal range of motion. Neck supple. No thyromegaly present.  Cardiovascular: Normal rate, regular rhythm, normal heart sounds and intact distal pulses.  Exam reveals no gallop.   No murmur heard. Pulmonary/Chest: Effort normal and breath sounds normal. No respiratory distress. She has no wheezes. She has no rales.  Abdominal: Soft. There is no tenderness.  Musculoskeletal: She exhibits no edema or tenderness.  Lymphadenopathy:    She has no cervical adenopathy.  Skin:  Red papules R>L anterior calves  Psychiatric: She has a normal mood and affect. Her behavior is normal.          Assessment & Plan:

## 2014-09-05 NOTE — Assessment & Plan Note (Signed)
BP Readings from Last 3 Encounters:  09/05/14 140/80  08/09/13 124/62  08/02/13 122/76   No change Beta blocker due to history of tachycardia

## 2014-09-05 NOTE — Patient Instructions (Signed)
Call 1-800 QUIT NOW for advice about quitting smoking.

## 2014-09-06 ENCOUNTER — Encounter: Payer: Self-pay | Admitting: *Deleted

## 2014-10-17 ENCOUNTER — Telehealth: Payer: Self-pay | Admitting: Internal Medicine

## 2014-10-17 NOTE — Telephone Encounter (Signed)
Pt called to let us know that she is getting mammogram done tomorrow. Thanks.

## 2014-10-17 NOTE — Telephone Encounter (Signed)
ok 

## 2014-10-18 ENCOUNTER — Other Ambulatory Visit: Payer: Self-pay | Admitting: *Deleted

## 2014-10-18 MED ORDER — METOPROLOL SUCCINATE ER 50 MG PO TB24: 50.0000 mg | ORAL_TABLET | Freq: Every day | ORAL | Status: DC

## 2014-10-18 MED ORDER — ATORVASTATIN CALCIUM 10 MG PO TABS: 10.0000 mg | ORAL_TABLET | Freq: Every day | ORAL | Status: DC

## 2014-10-19 ENCOUNTER — Other Ambulatory Visit: Payer: Self-pay | Admitting: Obstetrics & Gynecology

## 2014-10-19 DIAGNOSIS — N63 Unspecified lump in unspecified breast: Secondary | ICD-10-CM

## 2014-10-26 ENCOUNTER — Ambulatory Visit
Admission: RE | Admit: 2014-10-26 | Discharge: 2014-10-26 | Disposition: A | Discharge status: Home / Self Care | Payer: PRIVATE HEALTH INSURANCE | Source: Ambulatory Visit | Attending: Obstetrics & Gynecology | Admitting: Obstetrics & Gynecology

## 2014-10-26 DIAGNOSIS — N63 Unspecified lump in unspecified breast: Secondary | ICD-10-CM

## 2014-11-06 ENCOUNTER — Telehealth: Payer: Self-pay | Admitting: Internal Medicine

## 2014-11-06 NOTE — Telephone Encounter (Signed)
[facility] went to [facility] she has a bladder infection.  They recommend her to go to an urology.  She wanted  to know if you could get her a referral

## 2014-11-06 NOTE — Telephone Encounter (Signed)
Spoke with patient and scheduled appt with Dr. Alphonsus Sias

## 2014-11-06 NOTE — Telephone Encounter (Signed)
She would need to come see Dr. Alphonsus Sias first, unless [facility] will be doing the referral?

## 2014-11-06 NOTE — Telephone Encounter (Signed)
Patient returned Dee's call. Please call patient back at [phone].

## 2014-11-06 NOTE — Telephone Encounter (Signed)
.  left message to have patient return my call.  

## 2014-11-06 NOTE — Telephone Encounter (Signed)
If she had an isolated bladder or urine infection--and hasn't had trouble voiding (like dribbling or apparent retention), she doesn't need a urologist or an appt here. If she has recurrent symptoms, we will need to reevaluate

## 2014-11-09 ENCOUNTER — Ambulatory Visit (INDEPENDENT_AMBULATORY_CARE_PROVIDER_SITE_OTHER)
Admission: RE | Admit: 2014-11-09 | Discharge: 2014-11-09 | Disposition: A | Discharge status: Home / Self Care | Payer: PRIVATE HEALTH INSURANCE | Source: Ambulatory Visit | Attending: Internal Medicine | Admitting: Internal Medicine

## 2014-11-09 ENCOUNTER — Encounter: Payer: Self-pay | Admitting: Internal Medicine

## 2014-11-09 ENCOUNTER — Ambulatory Visit (INDEPENDENT_AMBULATORY_CARE_PROVIDER_SITE_OTHER): Payer: PRIVATE HEALTH INSURANCE | Admitting: Internal Medicine

## 2014-11-09 VITALS — BP 128/70 | HR 90 | Temp 98.0°F | Wt 159.0 lb

## 2014-11-09 DIAGNOSIS — N390 Urinary tract infection, site not specified: Secondary | ICD-10-CM | POA: Diagnosis not present

## 2014-11-09 DIAGNOSIS — R300 Dysuria: Secondary | ICD-10-CM

## 2014-11-09 LAB — POCT URINALYSIS DIPSTICK
BILIRUBIN UA: NEGATIVE
Glucose, UA: NEGATIVE
Ketones, UA: NEGATIVE
Leukocytes, UA: NEGATIVE
NITRITE UA: NEGATIVE
PROTEIN UA: NEGATIVE
RBC UA: NEGATIVE
Spec Grav, UA: 1.02
Urobilinogen, UA: NEGATIVE
pH, UA: 6.5

## 2014-11-09 MED ORDER — CIPROFLOXACIN HCL 250 MG PO TABS: 250.0000 mg | ORAL_TABLET | Freq: Two times a day (BID) | ORAL | Status: DC

## 2014-11-09 NOTE — Progress Notes (Signed)
Pre visit review using our clinic review tool, if applicable. No additional management support is needed unless otherwise documented below in the visit note. 

## 2014-11-09 NOTE — Progress Notes (Signed)
Subjective:    Patient ID: [name], female    DOB: 1960/08/07, 54 y.o.   MRN: [id number]  HPI Here due to urinary problems  Had seen gyn for routine check Did urinalysis and told her she had UTI--though no symptoms Was put on nitrofurantoin after culture report Then developed symptoms of pain in suprapubic area and left flank Tensing along muscles there  Went to urgent care and got 3 days of levaquin Seemed to get better after this (started 8/21) Finished 2 days ago  Has some urgency but not consistently Has had ongoing problems since March Doesn't always feel that she is emptying---will occasionally have to go back after voiding Never has dysuria Had fever after the nitrofurantoin--prompted her visit to urgent care  Has been taking a cranberry tablet since starting problems in March Current Outpatient Prescriptions on File Prior to Visit  Medication Sig Dispense Refill  . Ascorbic Acid (VITAMIN C PO) Take 1 tablet by mouth daily.    Marland Kitchen aspirin EC 81 MG tablet Take 81 mg by mouth daily.    Marland Kitchen atorvastatin (LIPITOR) 10 MG tablet Take 1 tablet (10 mg total) by mouth daily. 90 tablet 3  . B Complex Vitamins (VITAMIN B COMPLEX PO) Take 1 tablet by mouth daily.    Marland Kitchen CALCIUM-VITAMIN D PO Take 1 tablet by mouth daily.    . Chromium Picolinate (CHROMIUM PICOLATE) 1000 MCG TABS Take by mouth daily.    . fenofibrate (TRICOR) 145 MG tablet Take 145 mg by mouth daily.    Marland Kitchen MAGNESIUM PO Take 1 tablet by mouth daily.    . metoprolol succinate (TOPROL-XL) 50 MG 24 hr tablet Take 1 tablet (50 mg total) by mouth daily. Take with or immediately following a meal. 90 tablet 3  . Multiple Vitamin (MULTIVITAMIN WITH MINERALS) TABS tablet Take 1 tablet by mouth daily.    . Omega-3 Fatty Acids (FISH OIL PO) Take 2 capsules by mouth daily.    Marland Kitchen triamcinolone cream (KENALOG) 0.1 %     . VITAMIN E PO Take 1 capsule by mouth daily.     No current facility-administered medications on file prior  to visit.    Allergies  Allergen Reactions  . Cephalexin Hives and Rash    Past Medical History  Diagnosis Date  . Hypercholesteremia   . Hypertension   . Tachycardia     likely brief SVT  . Allergic rhinitis due to pollen     Past Surgical History  Procedure Laterality Date  . Laparoscopic appendectomy N/A 06/30/2013    Procedure: APPENDECTOMY LAPAROSCOPIC;  Surgeon: Shelly Rubenstein, MD;  Location: [facility] OR;  Service: General;  Laterality: N/A;    Family History  Problem Relation Age of Onset  . Heart disease Mother   . Diabetes Mother   . Arthritis Father   . Hyperlipidemia Sister   . Obesity Brother   . Cancer Maternal Grandmother     melanoma  . Heart disease Paternal Grandfather     Social History   Social History  . Marital Status: Widowed    Spouse Name: N/A  . Number of Children: 2  . Years of Education: N/A   Occupational History  . housewife    Social History Main Topics  . Smoking status: Current Every Day Smoker  . Smokeless tobacco: Never Used  . Alcohol Use: 0.0 oz/week    0 Standard drinks or equivalent per week     Comment: rare alcohol  . Drug  Use: No  . Sexual Activity: Not on file   Other Topics Concern  . Not on file   Social History Narrative   2 daughters   Review of Systems  No history of kidney stones Some nausea bu no vomiting Still eating okay but appetite is off No recent sex    Objective:   Physical Exam  Constitutional: She appears well-developed and well-nourished. No distress.  Abdominal: Soft. She exhibits no distension. There is no tenderness. There is no rebound and no guarding.  Musculoskeletal:  No CVA tenderness          Assessment & Plan:

## 2014-11-09 NOTE — Assessment & Plan Note (Signed)
Some problems since March---has really been sick a couple of times Started on nitrofurantoin by gyn---urine checked without symptoms---then she got sick again Urine negative now (after 3 days of levaquin) No history of stones  Will have her continue the cranberry Check KUB to be sure no clear stones cipro prn for 3 days for symptoms again She will try to be sure she is emptying bladder Will need urology evaluation if ongoing problems

## 2014-11-09 NOTE — Patient Instructions (Signed)
If you start getting sick with the urinary symptoms, use the cipro for 3 days Let me know if you have ongoing problems, I will set you up for a urology evaluation.

## 2015-04-03 ENCOUNTER — Other Ambulatory Visit: Payer: Self-pay

## 2015-04-03 MED ORDER — METOPROLOL SUCCINATE ER 50 MG PO TB24: 50.0000 mg | ORAL_TABLET | Freq: Every day | ORAL | Status: DC

## 2015-04-03 MED ORDER — ATORVASTATIN CALCIUM 10 MG PO TABS: 10.0000 mg | ORAL_TABLET | Freq: Every day | ORAL | Status: DC

## 2015-04-03 NOTE — Telephone Encounter (Signed)
Pt ins changed and request refill atorvastatin and metoprolol to [facility]. Advised pt done per protocol. Pt scheduled CPX 08/2015.

## 2015-07-05 ENCOUNTER — Encounter: Payer: Self-pay | Admitting: Family Medicine

## 2015-07-05 ENCOUNTER — Ambulatory Visit (INDEPENDENT_AMBULATORY_CARE_PROVIDER_SITE_OTHER): Payer: PRIVATE HEALTH INSURANCE | Admitting: Family Medicine

## 2015-07-05 VITALS — BP 124/76 | HR 88 | Temp 98.6°F | Wt 164.0 lb

## 2015-07-05 DIAGNOSIS — R300 Dysuria: Secondary | ICD-10-CM

## 2015-07-05 DIAGNOSIS — N3000 Acute cystitis without hematuria: Secondary | ICD-10-CM | POA: Diagnosis not present

## 2015-07-05 DIAGNOSIS — N390 Urinary tract infection, site not specified: Secondary | ICD-10-CM | POA: Diagnosis not present

## 2015-07-05 LAB — POC URINALSYSI DIPSTICK (AUTOMATED)
Glucose, UA: NEGATIVE
KETONES UA: NEGATIVE
Nitrite, UA: NEGATIVE
Spec Grav, UA: 1.015
Urobilinogen, UA: 0.2
pH, UA: 7.5

## 2015-07-05 MED ORDER — SULFAMETHOXAZOLE-TRIMETHOPRIM 800-160 MG PO TABS: 1.0000 | ORAL_TABLET | Freq: Two times a day (BID) | ORAL | Status: DC

## 2015-07-05 MED ORDER — PHENAZOPYRIDINE HCL 100 MG PO TABS: 100.0000 mg | ORAL_TABLET | Freq: Three times a day (TID) | ORAL | Status: DC | PRN

## 2015-07-05 NOTE — Progress Notes (Signed)
Pre visit review using our clinic review tool, if applicable. No additional management support is needed unless otherwise documented below in the visit note. 

## 2015-07-05 NOTE — Assessment & Plan Note (Signed)
Ongoing UTI's - several last year as well as 2nd this year. Unfortunately have no cultures confirming UTI available over last year and today's culture is not as reliable given 3d cipro course prior to UCx. Regardless will send UCx. As worsening symptoms despite cipro, stop med, start bactrim DS 1 BID x 2 wk course. Will refer to urology for further evaluation of recurrent UTI infections. Will treat bladder spasm with pyridium Discussed avoiding caffeine, increased water intake.  Pt agrees with plan.

## 2015-07-05 NOTE — Patient Instructions (Addendum)
Stop cipro - start bactrim twice daily for 2 weeks sent to pharmacy May use pyridium for bladder spasm Urine culture sent today.  Pass by Shirlee LimerickMarion' or allison's office for referral to urologist.  Good to see you today, watch for fever, chills, nausea/vomiting, or worsening flank pain.   Urinary Tract Infection Urinary tract infections (UTIs) can develop anywhere along your urinary tract. Your urinary tract is your body's drainage system for removing wastes and extra water. Your urinary tract includes two kidneys, two ureters, a bladder, and a urethra. Your kidneys are a pair of bean-shaped organs. Each kidney is about the size of your fist. They are located below your ribs, one on each side of your spine. CAUSES Infections are caused by microbes, which are microscopic organisms, including fungi, viruses, and bacteria. These organisms are so small that they can only be seen through a microscope. Bacteria are the microbes that most commonly cause UTIs. SYMPTOMS  Symptoms of UTIs may vary by age and gender of the patient and by the location of the infection. Symptoms in young women typically include a frequent and intense urge to urinate and a painful, burning feeling in the bladder or urethra during urination. Older women and men are more likely to be tired, shaky, and weak and have muscle aches and abdominal pain. A fever may mean the infection is in your kidneys. Other symptoms of a kidney infection include pain in your back or sides below the ribs, nausea, and vomiting. DIAGNOSIS To diagnose a UTI, your caregiver will ask you about your symptoms. Your caregiver will also ask you to provide a urine sample. The urine sample will be tested for bacteria and white blood cells. White blood cells are made by your body to help fight infection. TREATMENT  Typically, UTIs can be treated with medication. Because most UTIs are caused by a bacterial infection, they usually can be treated with the use of  antibiotics. The choice of antibiotic and length of treatment depend on your symptoms and the type of bacteria causing your infection. HOME CARE INSTRUCTIONS  If you were prescribed antibiotics, take them exactly as your caregiver instructs you. Finish the medication even if you feel better after you have only taken some of the medication.  Drink enough water and fluids to keep your urine clear or pale yellow.  Avoid caffeine, tea, and carbonated beverages. They tend to irritate your bladder.  Empty your bladder often. Avoid holding urine for long periods of time.  Empty your bladder before and after sexual intercourse.  After a bowel movement, women should cleanse from front to back. Use each tissue only once. SEEK MEDICAL CARE IF:   You have back pain.  You develop a fever.  Your symptoms do not begin to resolve within 3 days. SEEK IMMEDIATE MEDICAL CARE IF:   You have severe back pain or lower abdominal pain.  You develop chills.  You have nausea or vomiting.  You have continued burning or discomfort with urination. MAKE SURE YOU:   Understand these instructions.  Will watch your condition.  Will get help right away if you are not doing well or get worse.   This information is not intended to replace advice given to you by your health care provider. Make sure you discuss any questions you have with your health care provider.   Document Released: 12/11/2004 Document Revised: 11/22/2014 Document Reviewed: 04/11/2011 Elsevier Interactive Patient Education [employer]2016 Elsevier Inc.

## 2015-07-05 NOTE — Progress Notes (Signed)
BP 124/76 mmHg  Pulse 88  Temp(Src) 98.6 F (37 C) (Oral)  Wt 164 lb (74.39 kg)  SpO2 98%   CC: UTI?  Subjective:    Patient ID: [name], female    DOB: [birth year], 55 y.o.   MRN: [id number]  HPI: [name] is a 55 y.o. female presenting on 07/05/2015 for Possible UTI   Last week was 1 yr anniversary of husband's passing. Pending cruise in 3 wks.   H/o recurrent UTI, last seen by Dr Alphonsus SiasLetvak 10/2014 for this, treated with nitrofurantoin then levaquin. Placed on cipro 3d course PRN future UTI sxs. Took 2 cipro in March, then again this week - endorses 4d h/o abd bloating/fullness, malaise, dysuria, some urinary frequency,  Tmax 100.1. Decreased appetite and mild nausea. Tuesday started cipro 250mg  bid - symptoms getting worse.   Denies urgency, gross hematuria, flank or back pain, abd pain, vomiting.   Drinks water and some caffeinated beverages.   Regularly takes cranberry tablets.  H/o hives to keflex.   Relevant past medical, surgical, family and social history reviewed and updated as indicated. Interim medical history since our last visit reviewed. Allergies and medications reviewed and updated. Current Outpatient Prescriptions on File Prior to Visit  Medication Sig  . Ascorbic Acid (VITAMIN C PO) Take 500 Units by mouth daily.   Marland Kitchen. aspirin EC 81 MG tablet Take 81 mg by mouth daily.  Marland Kitchen. atorvastatin (LIPITOR) 10 MG tablet Take 1 tablet (10 mg total) by mouth daily.  . B Complex Vitamins (VITAMIN B COMPLEX PO) Take 1 tablet by mouth daily.  Marland Kitchen. CALCIUM-VITAMIN D PO Take 1 tablet by mouth daily.  . Chromium Picolinate (CHROMIUM PICOLATE) 1000 MCG TABS Take by mouth daily.  Marland Kitchen. MAGNESIUM PO Take 400 mg by mouth daily.   . metoprolol succinate (TOPROL-XL) 50 MG 24 hr tablet Take 1 tablet (50 mg total) by mouth daily. Take with or immediately following a meal.  . Multiple Vitamin (MULTIVITAMIN WITH MINERALS) TABS tablet Take 1 tablet by mouth daily.  . Omega-3 Fatty  Acids (FISH OIL PO) Take 1,000 mg by mouth daily.   Marland Kitchen. triamcinolone cream (KENALOG) 0.1 %   . VITAMIN E PO Take 1 capsule by mouth daily.   No current facility-administered medications on file prior to visit.    Review of Systems Per HPI unless specifically indicated in ROS section     Objective:    BP 124/76 mmHg  Pulse 88  Temp(Src) 98.6 F (37 C) (Oral)  Wt 164 lb (74.39 kg)  SpO2 98%  Wt Readings from Last 3 Encounters:  07/05/15 164 lb (74.39 kg)  11/09/14 159 lb (72.122 kg)  09/05/14 156 lb (70.761 kg)    Physical Exam  Constitutional: She appears well-developed and well-nourished. No distress.  Abdominal: Soft. Normal appearance and bowel sounds are normal. She exhibits no distension and no mass. There is no hepatosplenomegaly. There is tenderness in the suprapubic area and left upper quadrant. There is no rigidity, no rebound, no guarding, no CVA tenderness and negative Murphy's sign.  Suprapubic and left flank discomfort to palpation  Skin: Skin is warm and dry.  Nursing note and vitals reviewed.  Results for orders placed or performed in visit on 07/05/15  POCT Urinalysis Dipstick (Automated)  Result Value Ref Range   Color, UA Amber    Clarity, UA Cloudy    Glucose, UA Negative    Bilirubin, UA Trace    Ketones, UA Negative  Spec Grav, UA 1.015    Blood, UA Large (200 Ery/uL)    pH, UA 7.5    Protein, UA / dL    Urobilinogen, UA 0.2    Nitrite, UA Negative    Leukocytes, UA moderate (2+) (A) Negative   ABDOMEN - 1 VIEW COMPARISON: CT 07/04/2013 FINDINGS: Bowel gas pattern is nonobstructive mild fecal retention over the rectosigmoid colon and left colon. No focal mass or abnormal calcifications. Minimal degenerative change of the spine and hips. IMPRESSION: Nonobstructive bowel gas pattern. Electronically Signed  By: Elberta Fortis M.D.  On: 11/09/2014 15:05    Assessment & Plan:   Problem List Items Addressed This Visit    Recurrent  UTI    Ongoing UTI's - several last year as well as 2nd this year. Unfortunately have no cultures confirming UTI available over last year and today's culture is not as reliable given 3d cipro course prior to UCx. Regardless will send UCx. As worsening symptoms despite cipro, stop med, start bactrim DS 1 BID x 2 wk course. Will refer to urology for further evaluation of recurrent UTI infections. Will treat bladder spasm with pyridium Discussed avoiding caffeine, increased water intake.  Pt agrees with plan.      Relevant Medications   phenazopyridine (PYRIDIUM) 100 MG tablet   sulfamethoxazole-trimethoprim (BACTRIM DS,SEPTRA DS) 800-160 MG tablet    Other Visit Diagnoses    Acute cystitis without hematuria    -  Primary    Relevant Orders    Urine culture    Ambulatory referral to Urology    Dysuria        Relevant Orders    POCT Urinalysis Dipstick (Automated) (Completed)        Follow up plan: Return if symptoms worsen or fail to improve.  Eustaquio Boyden, MD

## 2015-07-06 LAB — URINE CULTURE
Colony Count: NO GROWTH
ORGANISM ID, BACTERIA: NO GROWTH

## 2015-07-23 ENCOUNTER — Encounter: Payer: Self-pay | Admitting: Urology

## 2015-07-23 ENCOUNTER — Ambulatory Visit (INDEPENDENT_AMBULATORY_CARE_PROVIDER_SITE_OTHER): Payer: 59 | Admitting: Urology

## 2015-07-23 VITALS — BP 148/86 | HR 87 | Ht 63.0 in | Wt 166.9 lb

## 2015-07-23 DIAGNOSIS — R310 Gross hematuria: Secondary | ICD-10-CM

## 2015-07-23 DIAGNOSIS — N3001 Acute cystitis with hematuria: Secondary | ICD-10-CM

## 2015-07-23 DIAGNOSIS — N2889 Other specified disorders of kidney and ureter: Secondary | ICD-10-CM

## 2015-07-23 LAB — URINALYSIS, COMPLETE
BILIRUBIN UA: NEGATIVE
Glucose, UA: NEGATIVE
KETONES UA: NEGATIVE
NITRITE UA: NEGATIVE
Protein, UA: NEGATIVE
RBC, UA: NEGATIVE
SPEC GRAV UA: 1.015 (ref 1.005–1.030)
UUROB: 0.2 mg/dL (ref 0.2–1.0)
pH, UA: 7 (ref 5.0–7.5)

## 2015-07-23 LAB — MICROSCOPIC EXAMINATION
Bacteria, UA: NONE SEEN
RBC MICROSCOPIC, UA: NONE SEEN /HPF (ref 0–?)

## 2015-07-23 NOTE — Progress Notes (Signed)
07/23/2015 9:20 AM   [name] 07-07-1960 [id number]  Referring provider: Karie Schwalbe, MD [address] [place], [place] [address]  Chief Complaint  Patient presents with  . Cystitis    referred by Eustaquio Boyden    HPI: Patient is a 55 -year-old Caucasian who presents today as a referral from their PCP, Dr. Sharen Hones, for cystitis.    Patient had an episode of gross hematuria a few months ago.    She has been having recurring UTI's over the last year.  I do not have a documented infection at this time.  She had an asymptomatic UTI last year diagnosed by her gynecologist.  She does not have a prior history of nephrolithiasis, trauma to the genitourinary tract or malignancies of the genitourinary tract.   She does not have a family medical history of nephrolithiasis, malignancies of the genitourinary tract or hematuria.   Today, she is having symptoms of frequent urination and nocturia.   She does experience postvoid dribbling, but she is on the toilet when it occurs. She is not experiencing urgency, dysuria, incontinence, hesitancy, intermittency, straining to urinate or a weak urinary stream. Her UA today is unremarkable.  She is not experiencing any suprapubic pain or abdominal pain, but she has been having intermittent left flank pain. The pain does not radiate.  It reaches 10/10 at its most intense.  She can have a fever of 101, malodorous urine and frequency with the flank pain.  She denies any chills, nausea or vomiting.   She underwent a CT of the abdomen and pelvis with contrast on 07/04/2013.  She was found to have a ruptured appendix at that time.  She did have small bilateral extrarenal pelves. A benign 1 cm Bosniak type I cyst in the left kidney. And a 4 mm nonenhancing peripheral nodule in the left kidney.  She is a smoker with a 30 ppd history.     PMH: Past Medical History  Diagnosis Date  . Hypercholesteremia   . Hypertension   .  Tachycardia     likely brief SVT  . Allergic rhinitis due to pollen   . Recurrent UTI     Surgical History: Past Surgical History  Procedure Laterality Date  . Laparoscopic appendectomy N/A 06/30/2013    Procedure: APPENDECTOMY LAPAROSCOPIC;  Surgeon: Shelly Rubenstein, MD;  Location: [facility] OR;  Service: General;  Laterality: N/A;    Home Medications:    Medication List       This list is accurate as of: 07/23/15  9:20 AM.  Always use your most recent med list.               aspirin EC 81 MG tablet  Take 81 mg by mouth daily.     atorvastatin 10 MG tablet  Commonly known as:  LIPITOR  Take 1 tablet (10 mg total) by mouth daily.     CALCIUM-VITAMIN D PO  Take 1 tablet by mouth daily.     Chromium Picolate 1000 MCG Tabs  Take by mouth daily.     CRANBERRY EXTRACT PO  Take 1-2 tablets by mouth daily.     FISH OIL PO  Take 1,000 mg by mouth daily.     MAGNESIUM PO  Take 400 mg by mouth daily.     metoprolol succinate 50 MG 24 hr tablet  Commonly known as:  TOPROL-XL  Take 1 tablet (50 mg total) by mouth daily. Take with or immediately following  a meal.     multivitamin with minerals Tabs tablet  Take 1 tablet by mouth daily.     phenazopyridine 100 MG tablet  Commonly known as:  PYRIDIUM  Take 1 tablet (100 mg total) by mouth 3 (three) times daily as needed for pain.     sulfamethoxazole-trimethoprim 800-160 MG tablet  Commonly known as:  BACTRIM DS,SEPTRA DS  Take 1 tablet by mouth 2 (two) times daily.     triamcinolone cream 0.1 %  Commonly known as:  KENALOG     VITAMIN B COMPLEX PO  Take 1 tablet by mouth daily.     VITAMIN C PO  Take 500 Units by mouth daily.     VITAMIN E PO  Take 1 capsule by mouth daily.        Allergies:  Allergies  Allergen Reactions  . Cephalexin Hives and Rash    Family History: Family History  Problem Relation Age of Onset  . Heart disease Mother   . Diabetes Mother   . Arthritis Father   . Hyperlipidemia  Sister   . Obesity Brother   . Cancer Maternal Grandmother     melanoma  . Heart disease Paternal Grandfather   . Kidney disease Neg Hx     Social History:  reports that she has been smoking.  She has never used smokeless tobacco. She reports that she drinks alcohol. She reports that she does not use illicit drugs.  ROS: UROLOGY Frequent Urination?: Yes Hard to postpone urination?: No Burning/pain with urination?: No Get up at night to urinate?: Yes Leakage of urine?: No Urine stream starts and stops?: Yes Trouble starting stream?: No Do you have to strain to urinate?: No Blood in urine?: Yes Urinary tract infection?: Yes Sexually transmitted disease?: No Injury to kidneys or bladder?: No Painful intercourse?: No Weak stream?: No Currently pregnant?: No Vaginal bleeding?: No Last menstrual period?: n  Gastrointestinal Nausea?: No Vomiting?: No Indigestion/heartburn?: No Diarrhea?: No Constipation?: No  Constitutional Fever: No Night sweats?: No Weight loss?: No Fatigue?: No  Skin Skin rash/lesions?: No Itching?: No  Eyes Blurred vision?: Yes Double vision?: No  Ears/Nose/Throat Sore throat?: No Sinus problems?: No  Hematologic/Lymphatic Swollen glands?: No Easy bruising?: No  Cardiovascular Leg swelling?: No Chest pain?: No  Respiratory Cough?: Yes Shortness of breath?: No  Endocrine Excessive thirst?: No  Musculoskeletal Back pain?: No Joint pain?: No  Neurological Headaches?: No Dizziness?: No  Psychologic Depression?: No Anxiety?: No  Physical Exam: BP 148/86 mmHg  Pulse 87  Ht 5\' 3"  (1.6 m)  Wt 166 lb 14.4 oz (75.705 kg)  BMI 29.57 kg/m2  Constitutional: Well nourished. Alert and oriented, No acute distress. HEENT: Circle AT, moist mucus membranes. Trachea midline, no masses. Cardiovascular: No clubbing, cyanosis, or edema. Respiratory: Normal respiratory effort, no increased work of breathing. GI: Abdomen is soft, non  tender, non distended, no abdominal masses. Liver and spleen not palpable.  No hernias appreciated.  Stool sample for occult testing is not indicated.   GU: No CVA tenderness.  No bladder fullness or masses.  Atrophic external genitalia, normal pubic hair distribution, no lesions.  Urethral caruncle is noted.    No urethral masses, tenderness and/or tenderness. No bladder fullness, tenderness or masses. Normal vagina mucosa, good estrogen effect, no discharge, no lesions, good pelvic support, Grade II cystocele is noted.  No rectocele is noted.  No cervical motion tenderness.  Uterus is freely mobile and non-fixed.  No adnexal/parametria masses or tenderness noted.  Anus and perineum are without rashes or lesions.    Skin: No rashes, bruises or suspicious lesions. Lymph: No cervical or inguinal adenopathy. Neurologic: Grossly intact, no focal deficits, moving all 4 extremities. Psychiatric: Normal mood and affect.  Laboratory Data: Lab Results  Component Value Date   WBC 10.2 09/05/2014   HGB 13.4 09/05/2014   HCT 39.8 09/05/2014   MCV 91.5 09/05/2014   PLT 323.0 09/05/2014    Lab Results  Component Value Date   CREATININE 0.74 09/05/2014        Component Value Date/Time   CHOL 177 09/05/2014 1304   HDL 57.50 09/05/2014 1304   CHOLHDL 3 09/05/2014 1304   VLDL 35.2 09/05/2014 1304   LDLCALC 84 09/05/2014 1304    Lab Results  Component Value Date   AST 20 09/05/2014   Lab Results  Component Value Date   ALT 17 09/05/2014     Urinalysis Results for orders placed or performed in visit on 07/23/15  Microscopic Examination  Result Value Ref Range   WBC, UA 0-5 0 -  5 /hpf   RBC, UA None seen 0 -  2 /hpf   Epithelial Cells (non renal) 0-10 0 - 10 /hpf   Mucus, UA Present (A) Not Estab.   Bacteria, UA None seen None seen/Few  Urinalysis, Complete  Result Value Ref Range   Specific Gravity, UA 1.015 1.005 - 1.030   pH, UA 7.0 5.0 - 7.5   Color, UA Yellow Yellow    Appearance Ur Clear Clear   Leukocytes, UA Trace (A) Negative   Protein, UA Negative Negative/Trace   Glucose, UA Negative Negative   Ketones, UA Negative Negative   RBC, UA Negative Negative   Bilirubin, UA Negative Negative   Urobilinogen, Ur 0.2 0.2 - 1.0 mg/dL   Nitrite, UA Negative Negative   Microscopic Examination See below:      Assessment & Plan:    1. Gross hematuria:   Explained to patient the causes of blood in the urine are as follows: stones,  UTI's, damage to the urinary tract and/or cancer.  It is explained to the patient that they will be scheduled for a CT Urogram with contrast material and that in rare instances, an allergic reaction can be serious and even life threatening with the injection of contrast material.   The patient denies any allergies to contrast, iodine and/or seafood and is not taking metformin.  I have explained to the patient that they will  be scheduled for a cystoscopy in our office to evaluate their bladder.  The cystoscopy consists of passing a tube with a lens up through their urethra and into their urinary bladder.   We will inject the urethra with a lidocaine gel prior to introducing the cystoscope to help with any discomfort during the procedure.   After the procedure, they might experience blood in the urine and discomfort with urination.  This will abate after the first few voids.  I have  encouraged the patient to increase water intake  during this time.  Patient denies any allergies to lidocaine.   - Urinalysis, Complete - CULTURE, URINE COMPREHENSIVE - BUN + creatinine  2. Left renal mass:   Patient will be undergoing CT urogram for further evaluation.   3. Acute cystitis with hematuria:   I do not have any documented urinary tract infections. She will be undergoing CT urogram and cystoscopic evaluation in the future.  These tests may provide any etiology for her symptoms.  Return for CT Urogram report and cystoscopy.  These notes  generated with voice recognition software. I apologize for typographical errors.  Michiel Cowboy, PA-C  [facility] [address] [place], [place] [address] [phone]

## 2015-07-24 LAB — BUN+CREAT
BUN / CREAT RATIO: 20 (ref 9–23)
BUN: 13 mg/dL (ref 6–24)
CREATININE: 0.64 mg/dL (ref 0.57–1.00)
GFR calc Af Amer: 115 mL/min/{1.73_m2} (ref >59)
GFR calc non Af Amer: 100 mL/min/{1.73_m2} (ref >59)

## 2015-07-26 LAB — CULTURE, URINE COMPREHENSIVE

## 2015-08-07 ENCOUNTER — Ambulatory Visit
Admission: RE | Admit: 2015-08-07 | Discharge: 2015-08-07 | Disposition: A | Discharge status: Home / Self Care | Payer: No Typology Code available for payment source | Source: Ambulatory Visit | Attending: Urology | Admitting: Urology

## 2015-08-07 DIAGNOSIS — I700 Atherosclerosis of aorta: Secondary | ICD-10-CM | POA: Insufficient documentation

## 2015-08-07 DIAGNOSIS — N8110 Cystocele, unspecified: Secondary | ICD-10-CM | POA: Insufficient documentation

## 2015-08-07 DIAGNOSIS — R310 Gross hematuria: Secondary | ICD-10-CM | POA: Insufficient documentation

## 2015-08-07 DIAGNOSIS — M47896 Other spondylosis, lumbar region: Secondary | ICD-10-CM | POA: Insufficient documentation

## 2015-08-07 MED ORDER — IOPAMIDOL (ISOVUE-300) INJECTION 61%
125.0000 mL | Freq: Once | INTRAVENOUS | Status: AC | PRN
  Administered 2015-08-07: 125 mL via INTRAVENOUS

## 2015-08-14 ENCOUNTER — Ambulatory Visit (INDEPENDENT_AMBULATORY_CARE_PROVIDER_SITE_OTHER): Payer: 59 | Admitting: Urology

## 2015-08-14 VITALS — BP 155/78 | HR 85

## 2015-08-14 DIAGNOSIS — R310 Gross hematuria: Secondary | ICD-10-CM

## 2015-08-14 LAB — MICROSCOPIC EXAMINATION: Bacteria, UA: NONE SEEN

## 2015-08-14 LAB — URINALYSIS, COMPLETE
Bilirubin, UA: NEGATIVE
Glucose, UA: NEGATIVE
KETONES UA: NEGATIVE
NITRITE UA: NEGATIVE
PH UA: 7.5 (ref 5.0–7.5)
Protein, UA: NEGATIVE
RBC, UA: NEGATIVE
SPEC GRAV UA: 1.015 (ref 1.005–1.030)
Urobilinogen, Ur: 0.2 mg/dL (ref 0.2–1.0)

## 2015-08-14 MED ORDER — LIDOCAINE HCL 2 % EX GEL
1.0000 "application " | Freq: Once | CUTANEOUS | Status: AC
  Administered 2015-08-14: 1 via URETHRAL

## 2015-08-14 MED ORDER — CIPROFLOXACIN HCL 500 MG PO TABS
500.0000 mg | ORAL_TABLET | Freq: Once | ORAL | Status: AC
Start: 2015-08-14 — End: 2015-08-14
  Administered 2015-08-14: 500 mg via ORAL

## 2015-08-14 NOTE — Progress Notes (Signed)
08/14/2015 11:41 AM   [place] GuardianSusan K Renfrow 11-Aug-1960 [id number]  Referring provider: Karie Schwalbeichard I Letvak, MD [address] [place], [place] [id number]  Chief Complaint  Patient presents with  . Cysto    HPI: Ms Virgel BouquetShepard is a 54yo seen today for cystoscopy for hematuria and recurrent cystitis. CT scan showed left renal scarring but no other abnormalities. Marland Kitchen.     PMH: Past Medical History  Diagnosis Date  . Hypercholesteremia   . Hypertension   . Tachycardia     likely brief SVT  . Allergic rhinitis due to pollen   . Recurrent UTI     Surgical History: Past Surgical History  Procedure Laterality Date  . Laparoscopic appendectomy N/A 06/30/2013    Procedure: APPENDECTOMY LAPAROSCOPIC;  Surgeon: Shelly Rubensteinouglas A Blackman, MD;  Location: [facility] OR;  Service: General;  Laterality: N/A;    Home Medications:    Medication List       This list is accurate as of: 08/14/15 11:41 AM.  Always use your most recent med list.               aspirin EC 81 MG tablet  Take 81 mg by mouth daily.     atorvastatin 10 MG tablet  Commonly known as:  LIPITOR  Take 1 tablet (10 mg total) by mouth daily.     CALCIUM-VITAMIN D PO  Take 1 tablet by mouth daily.     Chromium Picolate 1000 MCG Tabs  Take by mouth daily.     CRANBERRY EXTRACT PO  Take 1-2 tablets by mouth daily.     FISH OIL PO  Take 1,000 mg by mouth daily.     MAGNESIUM PO  Take 400 mg by mouth daily.     metoprolol succinate 50 MG 24 hr tablet  Commonly known as:  TOPROL-XL  Take 1 tablet (50 mg total) by mouth daily. Take with or immediately following a meal.     multivitamin with minerals Tabs tablet  Take 1 tablet by mouth daily.     phenazopyridine 100 MG tablet  Commonly known as:  PYRIDIUM  Take 1 tablet (100 mg total) by mouth 3 (three) times daily as needed for pain.     sulfamethoxazole-trimethoprim 800-160 MG tablet  Commonly known as:  BACTRIM DS,SEPTRA DS  Take 1 tablet by mouth 2 (two) times daily.       triamcinolone cream 0.1 %  Commonly known as:  KENALOG     VITAMIN B COMPLEX PO  Take 1 tablet by mouth daily.     VITAMIN C PO  Take 500 Units by mouth daily.     VITAMIN E PO  Take 1 capsule by mouth daily.        Allergies:  Allergies  Allergen Reactions  . Cephalexin Hives and Rash    Family History: Family History  Problem Relation Age of Onset  . Heart disease Mother   . Diabetes Mother   . Arthritis Father   . Hyperlipidemia Sister   . Obesity Brother   . Cancer Maternal Grandmother     melanoma  . Heart disease Paternal Grandfather   . Kidney disease Neg Hx     Social History:  reports that she has been smoking.  She has never used smokeless tobacco. She reports that she drinks alcohol. She reports that she does not use illicit drugs.  ROS:  Physical Exam: BP 155/78 mmHg  Pulse 85  Constitutional:  Alert and oriented, No acute distress. HEENT: [place] AT, moist mucus membranes.  Trachea midline, no masses. Cardiovascular: No clubbing, cyanosis, or edema. Respiratory: Normal respiratory effort, no increased work of breathing. GI: Abdomen is soft, nontender, nondistended, no abdominal masses GU: No CVA tenderness.  Skin: No rashes, bruises or suspicious lesions. Lymph: No cervical or inguinal adenopathy. Neurologic: Grossly intact, no focal deficits, moving all 4 extremities. Psychiatric: Normal mood and affect.  Laboratory Data: Lab Results  Component Value Date   WBC 10.2 09/05/2014   HGB 13.4 09/05/2014   HCT 39.8 09/05/2014   MCV 91.5 09/05/2014   PLT 323.0 09/05/2014    Lab Results  Component Value Date   CREATININE 0.64 07/23/2015    No results found for: PSA  No results found for: TESTOSTERONE  No results found for: HGBA1C  Urinalysis    Component Value Date/Time   APPEARANCEUR Clear 07/23/2015 0848   GLUCOSEU Negative 07/23/2015 0848   BILIRUBINUR Negative  07/23/2015 0848   BILIRUBINUR Trace 07/05/2015 1610   PROTEINUR Negative 07/23/2015 0848   PROTEINUR / dL 11/91/4782 9562   UROBILINOGEN 0.2 07/05/2015 1610   NITRITE Negative 07/23/2015 0848   NITRITE Negative 07/05/2015 1610   LEUKOCYTESUR Trace* 07/23/2015 0848   LEUKOCYTESUR moderate (2+)* 07/05/2015 1610    Pertinent Imaging: Ct urogram     Cystoscopy Procedure Note  Patient identification was confirmed, informed consent was obtained, and patient was prepped using Betadine solution.  Lidocaine jelly was administered per urethral meatus.    Preoperative abx where received prior to procedure.    Procedure: - Flexible cystoscope introduced, without any difficulty.   - Thorough search of the bladder revealed:    normal urethral meatus    normal urothelium    no stones    no ulcers     no tumors    no urethral polyps    no trabeculation  - Ureteral orifices were normal in position and appearance.  Post-Procedure: - Patient tolerated the procedure well   Assessment & Plan:    1. Gross hematuria - RTC 3 months, recheck cytology - Urinalysis, Complete - lidocaine (XYLOCAINE) 2 % jelly 1 application; Place 1 application into the urethra once. - ciprofloxacin (CIPRO) tablet 500 mg; Take 1 tablet (500 mg total) by mouth once.   No Follow-up on file.  Wilkie Aye, MD  [facility] [address] [place], [place] [address] [phone]

## 2015-08-15 IMAGING — MG MM DIAG BREAST TOMO BILATERAL
8 series · 8 of 24 positions shown · non-contrast
Comparison: Previous exam(s).

CLINICAL DATA: 53-year-old female presenting for follow-up of a 4
mm mass in the left breast at 7 o'clock. This was last evaluated on
02/20/2012, and the patient was subsequently lost to follow-up.

EXAM:
DIGITAL DIAGNOSTIC BILATERAL MAMMOGRAM WITH 3D TOMOSYNTHESIS AND CAD
LEFT BREAST ULTRASOUND

[L CC]
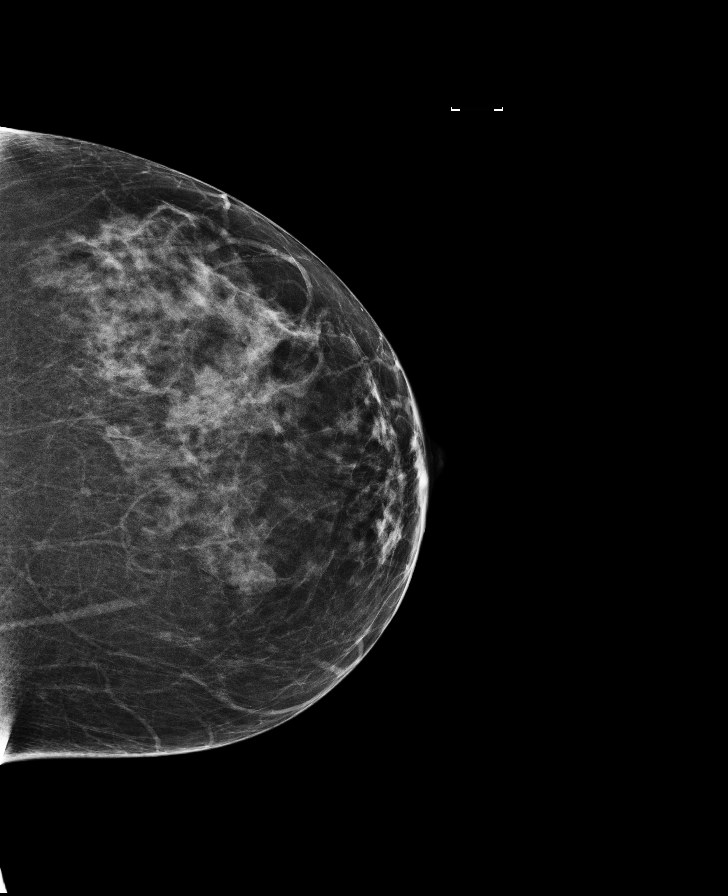

[R MLO]
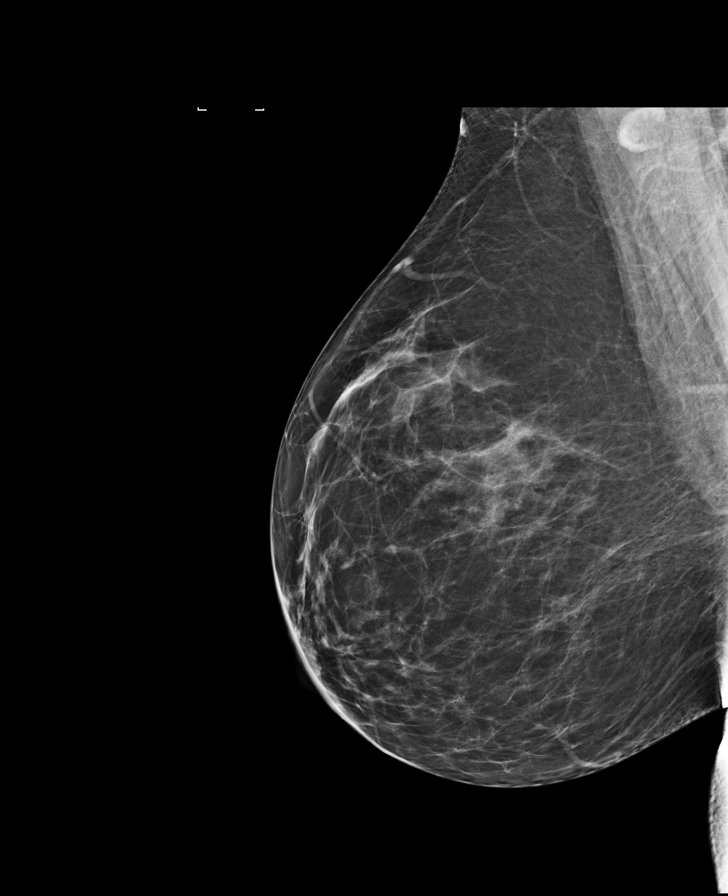

[R CC]
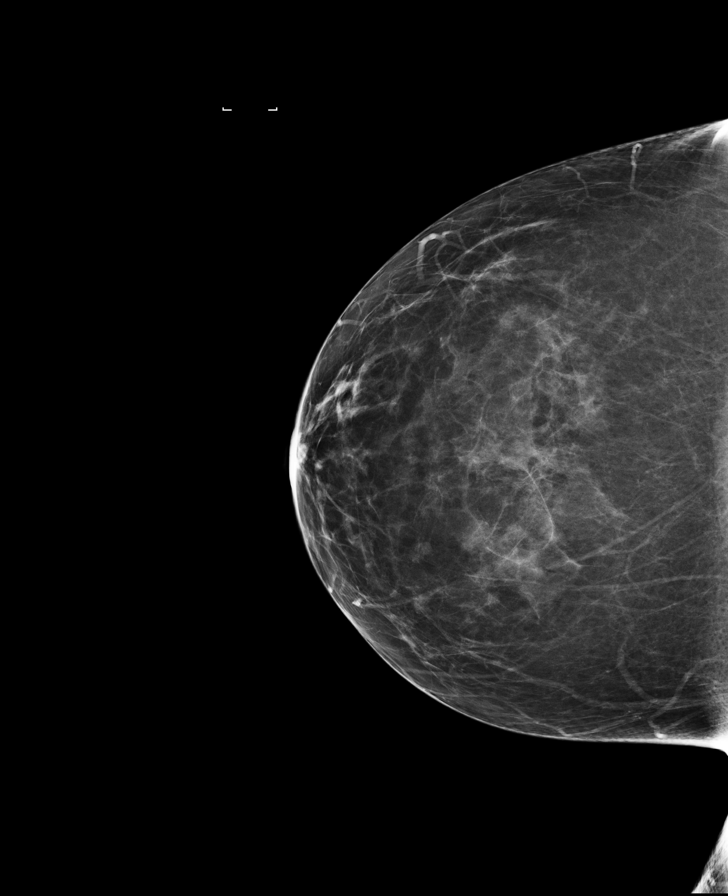

[L MLO]
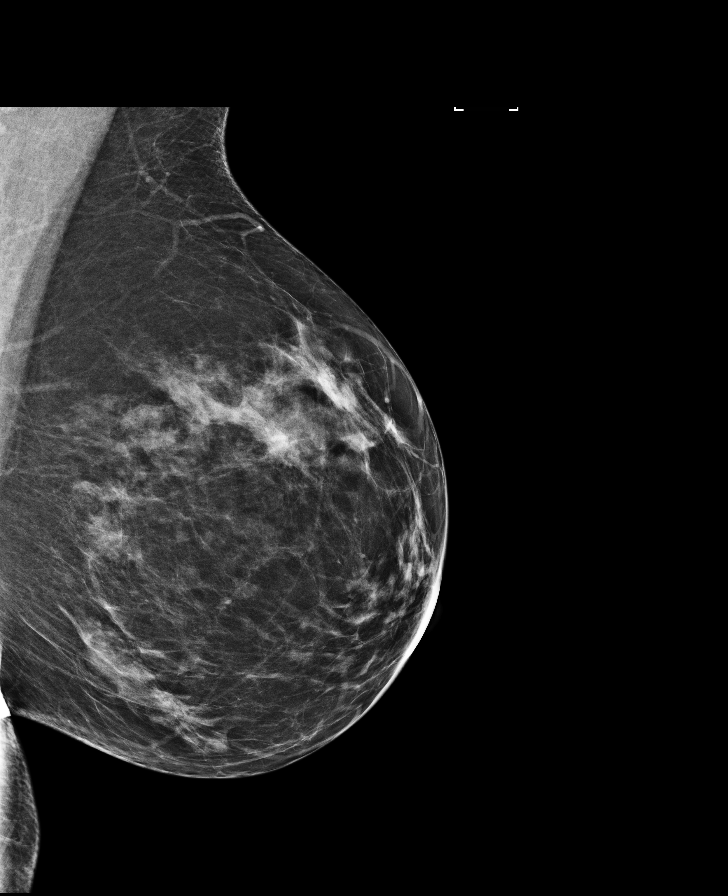

[L CC tomo · tomo slice 37/74.0]
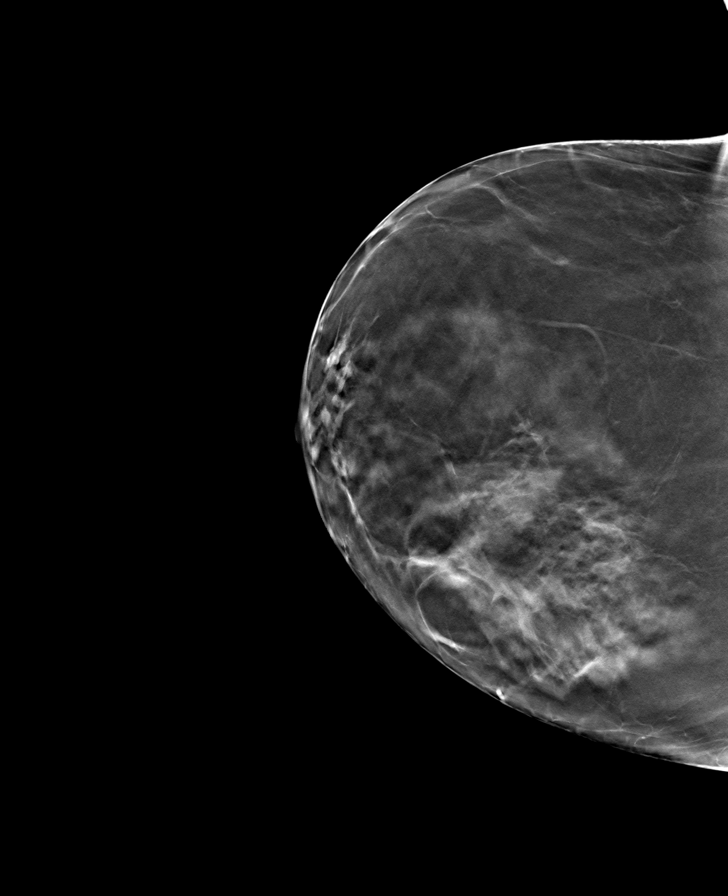

[L MLO tomo · tomo slice 40/79.0]
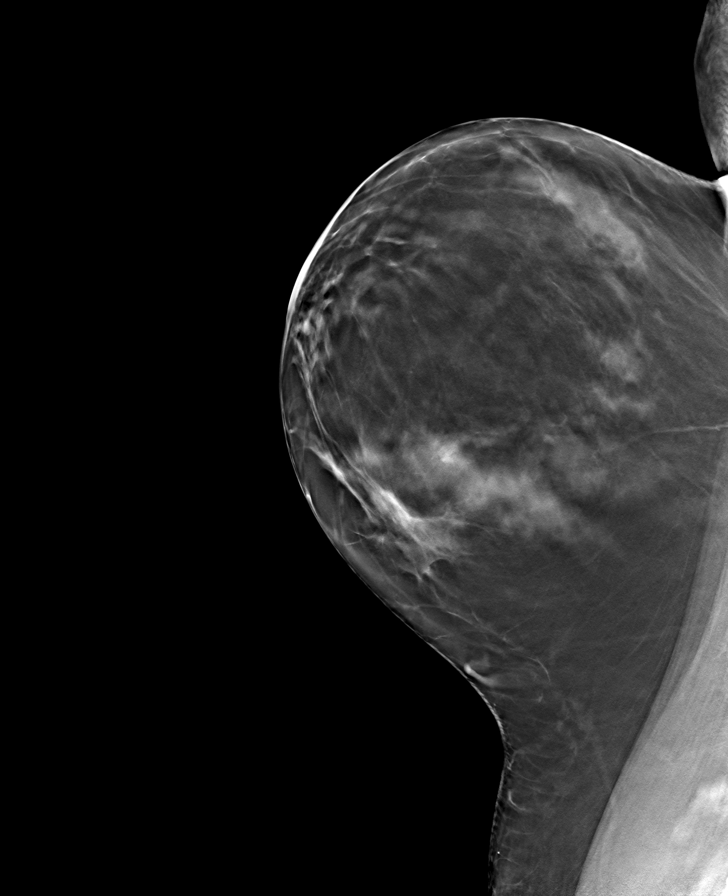

[R CC tomo · tomo slice 39/77.0]
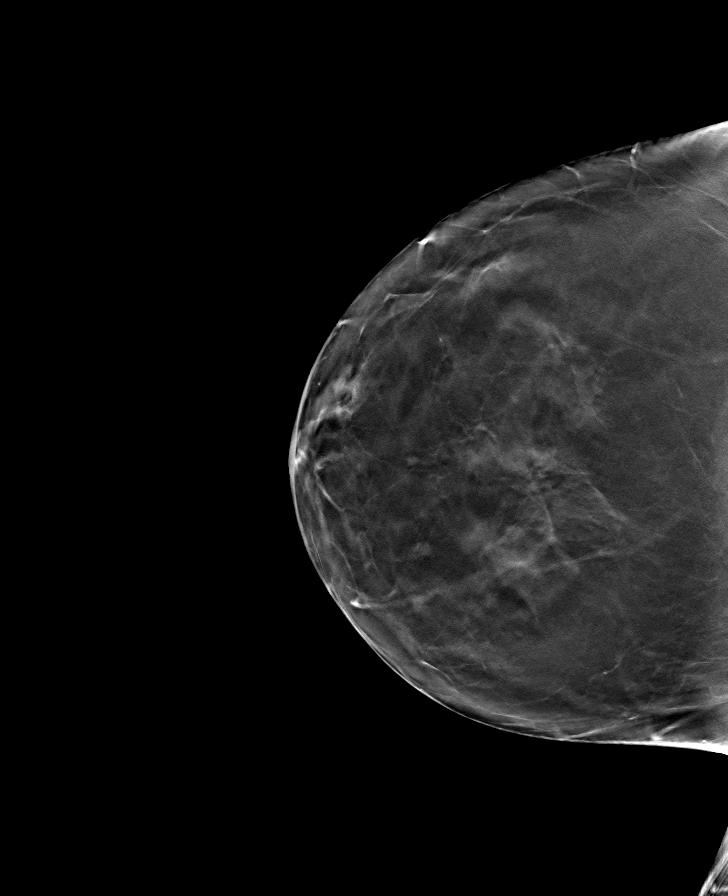

[R MLO tomo · tomo slice 41/81.0]
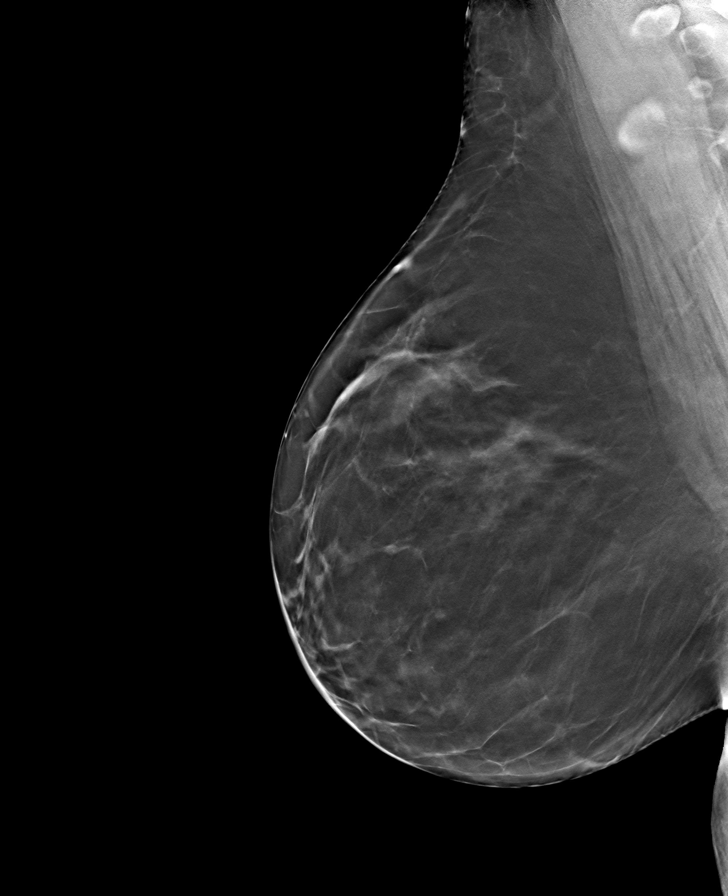

[8 of 24 positions shown; findings below may reference images not displayed]

ACR Breast Density Category c: The breast tissue is heterogeneously
dense, which may obscure small masses.
FINDINGS: There are no new masses, areas of distortion or suspicious
calcifications seen in the bilateral breast. Specifically, the small
mass in the inferior left breast seen on the 9414 exam is no longer
definitely visualized.

Mammographic images were processed with CAD.

Upon physical exam of the lower inner quadrant of the left breast,
normal fibroglandular tissue is palpated. No discrete masses are
identified.

Ultrasound of the lower inner quadrant of the left breast
demonstrates normal fibroglandular tissue. No suspicious masses or
areas of shadowing are seen at the 6-8 o'clock location.
IMPRESSION: 1. There is no mammographic evidence of malignancy in the bilateral
breasts.

2. The 4 mm mass in the left breast at 7 o'clock previously seen is
no longer visualized. This likely represented a benign cyst.

RECOMMENDATION:
Screening mammogram in one year.(Code:NM-D-44N)

I have discussed the findings and recommendations with the patient.
Results were also provided in writing at the conclusion of the
visit. If applicable, a reminder letter will be sent to the patient
regarding the next appointment.

BI-RADS CATEGORY  1: Negative.

## 2015-08-29 IMAGING — CR DG ABDOMEN 1V
1 series · 1 of 1 positions shown · non-contrast
Comparison: CT 07/04/2013

CLINICAL DATA: Intermittent left flank pain 5 months.

EXAM:
ABDOMEN - 1 VIEW

[view not recorded]
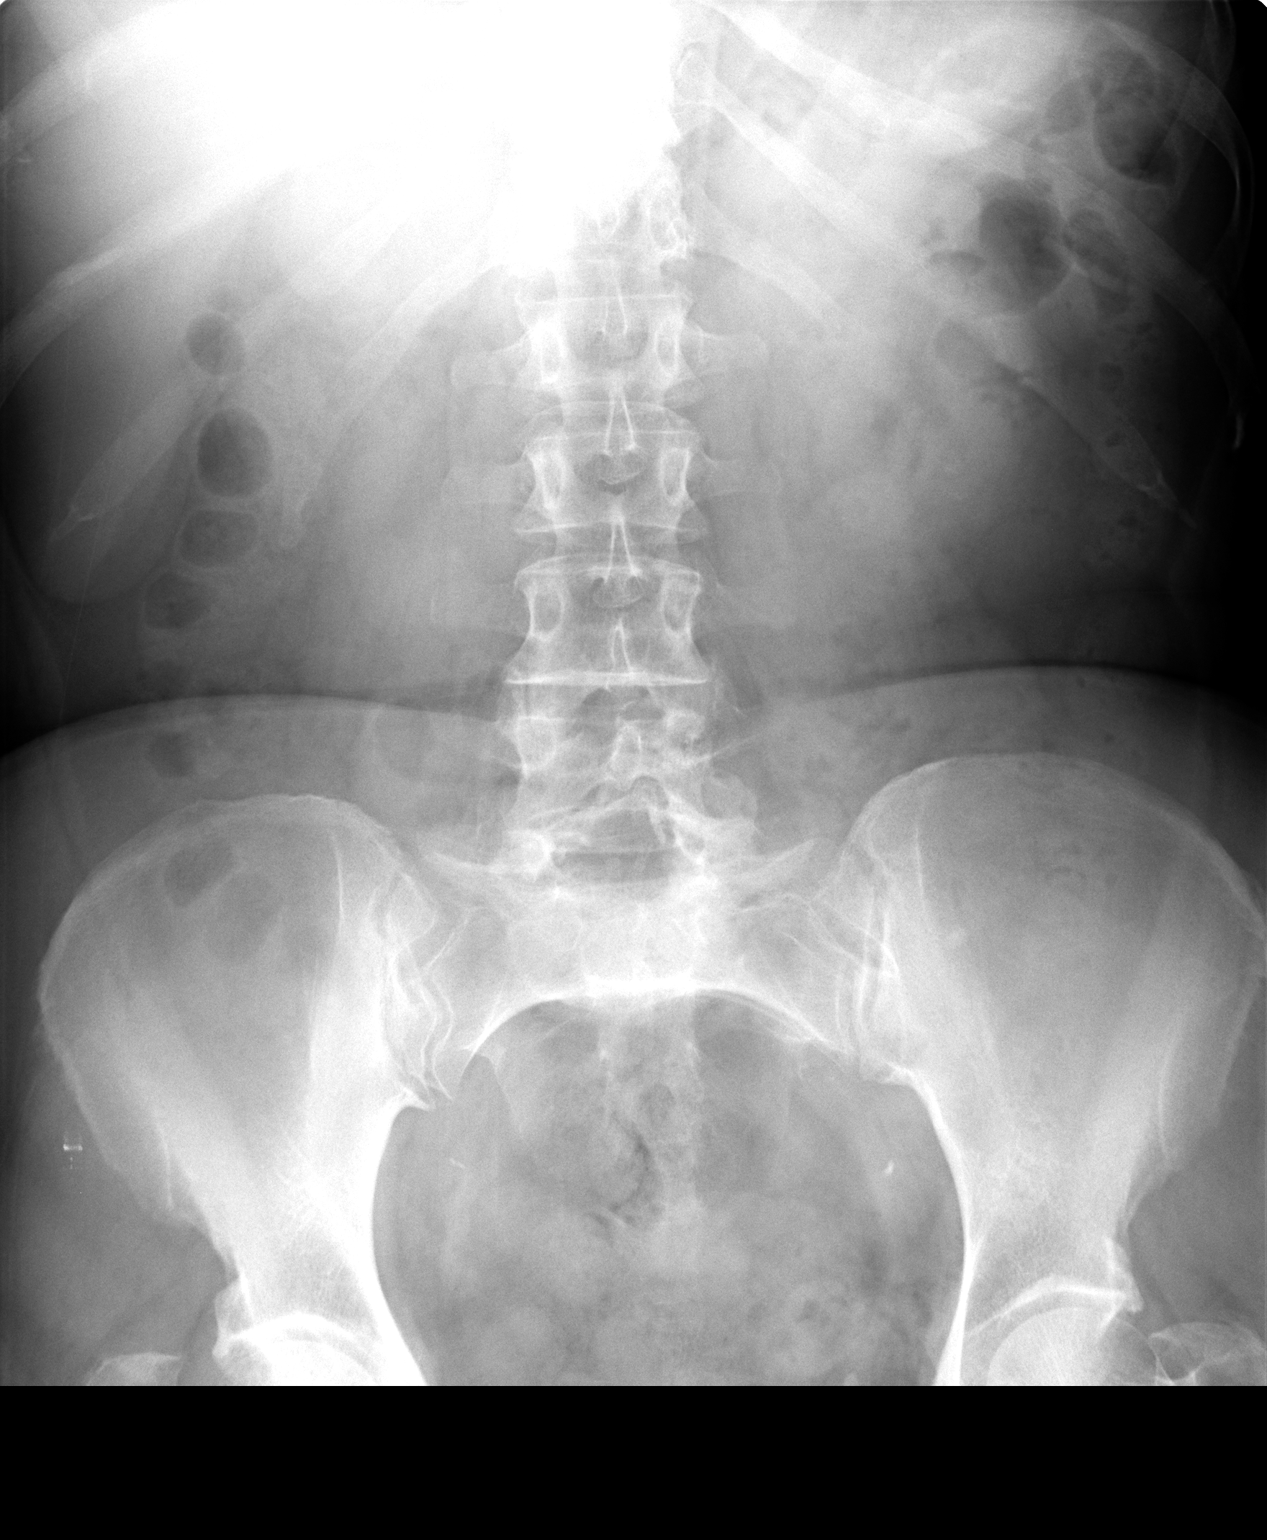

[1 of 1 positions shown; findings below may reference images not displayed]

FINDINGS: Bowel gas pattern is nonobstructive mild fecal retention over the
rectosigmoid colon and left colon. No focal mass or abnormal
calcifications. Minimal degenerative change of the spine and hips.
IMPRESSION: Nonobstructive bowel gas pattern.

## 2015-09-01 ENCOUNTER — Encounter: Payer: Self-pay | Admitting: *Deleted

## 2015-09-01 ENCOUNTER — Emergency Department
Admission: EM | Admit: 2015-09-01 | Discharge: 2015-09-01 | Disposition: A | Discharge status: Home / Self Care | Payer: No Typology Code available for payment source | Attending: Emergency Medicine | Admitting: Emergency Medicine

## 2015-09-01 DIAGNOSIS — I10 Essential (primary) hypertension: Secondary | ICD-10-CM | POA: Diagnosis not present

## 2015-09-01 DIAGNOSIS — F17210 Nicotine dependence, cigarettes, uncomplicated: Secondary | ICD-10-CM | POA: Diagnosis not present

## 2015-09-01 DIAGNOSIS — R040 Epistaxis: Secondary | ICD-10-CM | POA: Insufficient documentation

## 2015-09-01 DIAGNOSIS — Z7982 Long term (current) use of aspirin: Secondary | ICD-10-CM | POA: Diagnosis not present

## 2015-09-01 DIAGNOSIS — Z79899 Other long term (current) drug therapy: Secondary | ICD-10-CM | POA: Diagnosis not present

## 2015-09-01 MED ORDER — OXYMETAZOLINE HCL 0.05 % NA SOLN
1.0000 | Freq: Once | NASAL | Status: AC
  Administered 2015-09-01: 1 via NASAL
  Filled 2015-09-01: qty 15

## 2015-09-01 NOTE — ED Notes (Signed)
Discussed discharge instructions, prescriptions, and follow-up care with patient. No questions or concerns at this time. Pt stable at discharge.  

## 2015-09-01 NOTE — Discharge Instructions (Signed)
Nosebleed Nosebleeds are common. They are due to a crack in the inside lining of your nose (mucous membrane) or from a small blood vessel that starts to bleed. Nosebleeds can be caused by many conditions, such as injury, infections, dry mucous membranes or dry climate, medicines, nose picking, and home heating and cooling systems. Most nosebleeds come from blood vessels in the front of your nose. HOME CARE INSTRUCTIONS   Try controlling your nosebleed by pinching your nostrils gently and continuously for at least 10 minutes.  Avoid blowing or sniffing your nose for a number of hours after having a nosebleed.  Do not put gauze inside your nose yourself. If your nose was packed by your health care provider, try to maintain the pack inside of your nose until your health care provider removes it.  If a gauze pack was used and it starts to fall out, gently replace it or cut off the end of it.  If a balloon catheter was used to pack your nose, do not cut or remove it unless your health care provider has instructed you to do that.  Avoid lying down while you are having a nosebleed. Sit up and lean forward.  Use a nasal spray decongestant to help with a nosebleed as directed by your health care provider.  Do not use petroleum jelly or mineral oil in your nose. These can drip into your lungs.  Maintain humidity in your home by using less air conditioning or by using a humidifier.  Aspirinand blood thinners make bleeding more likely. If you are prescribed these medicines and you suffer from nosebleeds, ask your health care provider if you should stop taking the medicines or adjust the dose. Do not stop medicines unless directed by your health care provider  Resume your normal activities as you are able, but avoid straining, lifting, or bending at the waist for several days.  If your nosebleed was caused by dry mucous membranes, use over-the-counter saline nasal spray or gel. This will keep the  mucous membranes moist and allow them to heal. If you must use a lubricant, choose the water-soluble variety. Use it only sparingly, and do not use it within several hours of lying down.  Keep all follow-up visits as directed by your health care provider. This is important. SEEK MEDICAL CARE IF:  You have a fever.  You get frequent nosebleeds.  You are getting nosebleeds more often. SEEK IMMEDIATE MEDICAL CARE IF:  Your nosebleed lasts longer than 20 minutes.  Your nosebleed occurs after an injury to your face, and your nose looks crooked or broken.  You have unusual bleeding from other parts of your body.  You have unusual bruising on other parts of your body.  You feel light-headed or you faint.  You become sweaty.  You vomit blood.  Your nosebleed occurs after a head injury.   This information is not intended to replace advice given to you by your health care provider. Make sure you discuss any questions you have with your health care provider.   Document Released: 12/11/2004 Document Revised: 03/24/2014 Document Reviewed: 10/17/2013 Elsevier Interactive Patient Education 2016 [employer]   Use the Afrin as directed for acute nosebleed management. Use OTC water-soluble saline gel for moisturizing the nose. Avoid blowing the nose of sniffling for 12-hours. Follow-up with your provider or ENT as needed.

## 2015-09-01 NOTE — ED Provider Notes (Signed)
[facility] Emergency Department Provider Note ____________________________________________  Time seen: 1518  I have reviewed the triage vital signs and the nursing notes.  HISTORY  Chief Complaint  Epistaxis  HPI [name] is a 55 y.o. female presents to the ED for evaluation of a nosebleed that she had onset at about 11:30 AM. She denies any direct trauma to the face or nose. She also denies any previous history of nosebleeds.She describes sitting outside on the porch when she had a spontaneous bleed from the left nostril. She denies any nausea, vomiting, or dizziness. She attempted to apply pressure to the nose was able to stop the nosebleed after about 20 minutes. She went on with her day and again noticed been within about an hour the onset of a left-sided nosebleed. She again was able to control the bleeding by applying direct pressure to the anterior aspect of the nose. About an hour and a half following that she had another spontaneous nosebleed from the left nostril. She packed it with towels into the nostrils and reported to the ED for further evaluation. She presents here with no active bleeding of which she is currently aware. A nasal clip was applied in triage and has been in place for about 45-50 minutes.  Past Medical History  Diagnosis Date  . Hypercholesteremia   . Hypertension   . Tachycardia     likely brief SVT  . Allergic rhinitis due to pollen   . Recurrent UTI     Patient Active Problem List   Diagnosis Date Noted  . Gross hematuria 07/23/2015  . 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 Acute cystitis with hematuria 07/23/2015  . Recurrent UTI 11/09/2014  . Hypercholesteremia   . Hypertension   . Tachycardia   . Allergic rhinitis due to pollen     Past Surgical History  Procedure Laterality Date  . Laparoscopic appendectomy N/A 06/30/2013    Procedure: APPENDECTOMY LAPAROSCOPIC;  Surgeon: Shelly Rubenstein, MD;  Location: [facility] OR;  Service:  General;  Laterality: N/A;    Current Outpatient Rx  Name  Route  Sig  Dispense  Refill  . Ascorbic Acid (VITAMIN C PO)   Oral   Take 500 Units by mouth daily.          Marland Kitchen aspirin EC 81 MG tablet   Oral   Take 81 mg by mouth daily.         Marland Kitchen atorvastatin (LIPITOR) 10 MG tablet   Oral   Take 1 tablet (10 mg total) by mouth daily.   90 tablet   1   . B Complex Vitamins (VITAMIN B COMPLEX PO)   Oral   Take 1 tablet by mouth daily.         Marland Kitchen CALCIUM-VITAMIN D PO   Oral   Take 1 tablet by mouth daily.         . Chromium Picolinate (CHROMIUM PICOLATE) 1000 MCG TABS   Oral   Take by mouth daily.         Marland Kitchen CRANBERRY EXTRACT PO   Oral   Take 1-2 tablets by mouth daily.         Marland Kitchen MAGNESIUM PO   Oral   Take 400 mg by mouth daily.          . metoprolol succinate (TOPROL-XL) 50 MG 24 hr tablet   Oral   Take 1 tablet (50 mg total) by mouth daily. Take with or immediately following a meal.  90 tablet   1   . Multiple Vitamin (MULTIVITAMIN WITH MINERALS) TABS tablet   Oral   Take 1 tablet by mouth daily.         . Omega-3 Fatty Acids (FISH OIL PO)   Oral   Take 1,000 mg by mouth daily.          . phenazopyridine (PYRIDIUM) 100 MG tablet   Oral   Take 1 tablet (100 mg total) by mouth 3 (three) times daily as needed for pain. Patient not taking: Reported on 07/23/2015   10 tablet   0   . sulfamethoxazole-trimethoprim (BACTRIM DS,SEPTRA DS) 800-160 MG tablet   Oral   Take 1 tablet by mouth 2 (two) times daily. Patient not taking: Reported on 07/23/2015   28 tablet   0   . triamcinolone cream (KENALOG) 0.1 %               . VITAMIN E PO   Oral   Take 1 capsule by mouth daily.          Allergies Cephalexin  Family History  Problem Relation Age of Onset  . Heart disease Mother   . Diabetes Mother   . Arthritis Father   . Hyperlipidemia Sister   . Obesity Brother   . Cancer Maternal Grandmother     melanoma  . Heart disease Paternal  Grandfather   . Kidney disease Neg Hx     Social History Social History  Substance Use Topics  . Smoking status: Current Every Day Smoker -- 0.50 packs/day    Types: Cigarettes  . Smokeless tobacco: Never Used  . Alcohol Use: 0.0 oz/week    0 Standard drinks or equivalent per week     Comment: rare alcohol    Review of Systems  Constitutional: Negative for fever. Eyes: Negative for visual changes. ENT: Negative for sore throat.  Reports nosebleed as above. Respiratory: Negative for shortness of breath. Gastrointestinal: Negative for abdominal pain, vomiting and diarrhea. Neurological: Negative for headaches, focal weakness or numbness. ____________________________________________  PHYSICAL EXAM:  VITAL SIGNS: ED Triage Vitals  Enc Vitals Group     BP 09/01/15 1446 157/71 mmHg     Pulse Rate 09/01/15 1446 88     Resp 09/01/15 1446 20     Temp 09/01/15 1446 99.2 F (37.3 C)     Temp Source 09/01/15 1446 Oral     SpO2 09/01/15 1446 97 %     Weight 09/01/15 1446 165 lb (74.844 kg)     Height 09/01/15 1446 5\' 3"  (1.6 m)     Head Cir --      Peak Flow --      Pain Score --      Pain Loc --      Pain Edu? --      Excl. in GC? --     Constitutional: Alert and oriented. Well appearing and in no distress. Head: Normocephalic and atraumatic.      Eyes: Conjunctivae are normal. PERRL. Normal extraocular movements      Ears: Canals clear. TMs intact bilaterally.   Nose: No congestion/rhinorrhea. Left nare with evidence of a posterior bleed, which is currently controlled. Oxymetazoline is instilled into the left nostril. Dampened sterile swabs are used to further visualize the mucous membranes. Scant clots are removed using swabs. The exact posterior source is not identified.    Mouth/Throat: Mucous membranes are moist.   Neck: Supple. No thyromegaly. Respiratory: Normal respiratory effort.  Neurologic:  No gross focal neurologic deficits are appreciated. Skin:   Skin is warm, dry and intact. No rash noted. ____________________________________________  INITIAL IMPRESSION / ASSESSMENT AND PLAN / ED COURSE  Patient with an acute left sided nose bleed with bleeding currently controlled. She is discharged with instructions on management of acute nosebleeds including packing with gauze soaked in Afrin. She should return to the ED immediately for anyshe is not able to control within 30-45 minutes. She is further referred to Dr. Willeen Cass for ongoing management of any recurrent nosebleeds. She is advised to utilize over-the-counter saline to moisturize her sinuses and avoid nosebleed blowing for the next 12-24 hours. ____________________________________________  FINAL CLINICAL IMPRESSION(S) / ED DIAGNOSES  Final diagnoses:  Posterior epistaxis     Lissa Hoard, PA-C 09/01/15 1618  Sharman Cheek, MD 09/02/15 0003

## 2015-09-01 NOTE — ED Notes (Signed)
Pt nose started bleeding at 1130, pt takes 81mg  of aspirin daily, nose clamp applied in triage , bleeding stopped

## 2015-09-02 ENCOUNTER — Emergency Department
Admission: EM | Admit: 2015-09-02 | Discharge: 2015-09-02 | Disposition: A | Discharge status: Home / Self Care | Payer: No Typology Code available for payment source | Attending: Emergency Medicine | Admitting: Emergency Medicine

## 2015-09-02 ENCOUNTER — Encounter: Payer: Self-pay | Admitting: *Deleted

## 2015-09-02 ENCOUNTER — Emergency Department
Admission: EM | Admit: 2015-09-02 | Discharge: 2015-09-02 | Disposition: A | Discharge status: Home / Self Care | Payer: No Typology Code available for payment source | Source: Home / Self Care | Attending: Emergency Medicine | Admitting: Emergency Medicine

## 2015-09-02 DIAGNOSIS — Z79899 Other long term (current) drug therapy: Secondary | ICD-10-CM | POA: Insufficient documentation

## 2015-09-02 DIAGNOSIS — Z792 Long term (current) use of antibiotics: Secondary | ICD-10-CM

## 2015-09-02 DIAGNOSIS — F17210 Nicotine dependence, cigarettes, uncomplicated: Secondary | ICD-10-CM

## 2015-09-02 DIAGNOSIS — I10 Essential (primary) hypertension: Secondary | ICD-10-CM

## 2015-09-02 DIAGNOSIS — R040 Epistaxis: Secondary | ICD-10-CM | POA: Insufficient documentation

## 2015-09-02 DIAGNOSIS — Z7982 Long term (current) use of aspirin: Secondary | ICD-10-CM

## 2015-09-02 HISTORY — DX: Epistaxis: R04.0

## 2015-09-02 MED ORDER — OXYMETAZOLINE HCL 0.05 % NA SOLN
NASAL | Status: AC
  Filled 2015-09-02: qty 15

## 2015-09-02 MED ORDER — BACITRACIN ZINC 500 UNIT/GM EX OINT
TOPICAL_OINTMENT | Freq: Two times a day (BID) | CUTANEOUS | Status: DC
  Administered 2015-09-02: 1 via TOPICAL

## 2015-09-02 MED ORDER — OXYMETAZOLINE HCL 0.05 % NA SOLN
NASAL | Status: AC
  Administered 2015-09-02: 1 via NASAL
  Filled 2015-09-02: qty 15

## 2015-09-02 MED ORDER — TRANEXAMIC ACID 1000 MG/10ML IV SOLN
1000.0000 mg | Freq: Once | INTRAVENOUS | Status: AC
  Administered 2015-09-02: 1000 mg via TOPICAL
  Filled 2015-09-02: qty 10

## 2015-09-02 MED ORDER — TRANEXAMIC ACID 1000 MG/10ML IV SOLN
500.0000 mg | Freq: Once | INTRAVENOUS | Status: AC
  Administered 2015-09-02: 500 mg via TOPICAL
  Filled 2015-09-02 (×2): qty 10

## 2015-09-02 MED ORDER — BACITRACIN ZINC 500 UNIT/GM EX OINT
TOPICAL_OINTMENT | CUTANEOUS | Status: AC
  Filled 2015-09-02: qty 2.7

## 2015-09-02 MED ORDER — SULFAMETHOXAZOLE-TRIMETHOPRIM 800-160 MG PO TABS: 1.0000 | ORAL_TABLET | Freq: Two times a day (BID) | ORAL | Status: DC

## 2015-09-02 MED ORDER — OXYMETAZOLINE HCL 0.05 % NA SOLN
1.0000 | Freq: Once | NASAL | Status: AC
  Administered 2015-09-02: 1 via NASAL

## 2015-09-02 MED ORDER — TRANEXAMIC ACID 1000 MG/10ML IV SOLN
500.0000 mg | Freq: Once | INTRAVENOUS | Status: DC
  Filled 2015-09-02: qty 10

## 2015-09-02 MED ORDER — SULFAMETHOXAZOLE-TRIMETHOPRIM 800-160 MG PO TABS
1.0000 | ORAL_TABLET | Freq: Once | ORAL | Status: AC
  Administered 2015-09-02: 1 via ORAL
  Filled 2015-09-02: qty 1

## 2015-09-02 NOTE — ED Provider Notes (Signed)
[facility] Emergency Department Provider Note   ____________________________________________  Time seen: Approximately 5:20 AM  I have reviewed the triage vital signs and the nursing notes.   HISTORY  Chief Complaint Epistaxis    HPI [name] is a 55 y.o. female who returns to the ED from home for nosebleed. Patient has a history of hypertension, on baby aspirin, who was seen [facility] yesterday afternoon for nosebleed. Her nasal cavities were cleared and she was not packed. Awoke this morning with bleeding from both nostrils, heavier from the left nostril. Denies associated trauma/injury, headache, fever, chest pain, shortness of breath, nausea, vomiting, lightheadedness or dizziness.Applied Afrin to both nostrils prior to arrival without relief of symptoms.   Past Medical History  Diagnosis Date  . Hypercholesteremia   . Hypertension   . Tachycardia     likely brief SVT  . Allergic rhinitis due to pollen   . Recurrent UTI   . Epistaxis, recurrent     Patient Active Problem List   Diagnosis Date Noted  . Gross hematuria 07/23/2015  . 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 Acute cystitis with hematuria 07/23/2015  . Recurrent UTI 11/09/2014  . Hypercholesteremia   . Hypertension   . Tachycardia   . Allergic rhinitis due to pollen     Past Surgical History  Procedure Laterality Date  . Laparoscopic appendectomy N/A 06/30/2013    Procedure: APPENDECTOMY LAPAROSCOPIC;  Surgeon: Shelly Rubensteinouglas A Blackman, MD;  Location: [facility] OR;  Service: General;  Laterality: N/A;    Current Outpatient Rx  Name  Route  Sig  Dispense  Refill  . Ascorbic Acid (VITAMIN C PO)   Oral   Take 500 Units by mouth daily.          Marland Kitchen. aspirin EC 81 MG tablet   Oral   Take 81 mg by mouth daily.         Marland Kitchen. atorvastatin (LIPITOR) 10 MG tablet   Oral   Take 1 tablet (10 mg total) by mouth daily.   90 tablet   1   . B Complex Vitamins (VITAMIN B COMPLEX PO)   Oral  Take 1 tablet by mouth daily.         Marland Kitchen. CALCIUM-VITAMIN D PO   Oral   Take 1 tablet by mouth daily.         . Chromium Picolinate (CHROMIUM PICOLATE) 1000 MCG TABS   Oral   Take by mouth daily.         Marland Kitchen. CRANBERRY EXTRACT PO   Oral   Take 1-2 tablets by mouth daily.         Marland Kitchen. MAGNESIUM PO   Oral   Take 400 mg by mouth daily.          . metoprolol succinate (TOPROL-XL) 50 MG 24 hr tablet   Oral   Take 1 tablet (50 mg total) by mouth daily. Take with or immediately following a meal.   90 tablet   1   . Multiple Vitamin (MULTIVITAMIN WITH MINERALS) TABS tablet   Oral   Take 1 tablet by mouth daily.         . Omega-3 Fatty Acids (FISH OIL PO)   Oral   Take 1,000 mg by mouth daily.          . phenazopyridine (PYRIDIUM) 100 MG tablet   Oral   Take 1 tablet (100 mg total) by mouth 3 (three) times daily as needed for pain.  Patient not taking: Reported on 07/23/2015   10 tablet   0   . sulfamethoxazole-trimethoprim (BACTRIM DS,SEPTRA DS) 800-160 MG tablet   Oral   Take 1 tablet by mouth 2 (two) times daily. Patient not taking: Reported on 07/23/2015   28 tablet   0   . triamcinolone cream (KENALOG) 0.1 %               . VITAMIN E PO   Oral   Take 1 capsule by mouth daily.           Allergies Cephalexin  Family History  Problem Relation Age of Onset  . Heart disease Mother   . Diabetes Mother   . Arthritis Father   . Hyperlipidemia Sister   . Obesity Brother   . Cancer Maternal Grandmother     melanoma  . Heart disease Paternal Grandfather   . Kidney disease Neg Hx     Social History Social History  Substance Use Topics  . Smoking status: Current Every Day Smoker -- 0.50 packs/day    Types: Cigarettes  . Smokeless tobacco: Never Used  . Alcohol Use: 0.0 oz/week    0 Standard drinks or equivalent per week     Comment: rare alcohol    Review of Systems  Constitutional: No fever/chills. Eyes: No visual changes. ENT: Positive for  nosebleed. No sore throat. Cardiovascular: Denies chest pain. Respiratory: Denies shortness of breath. Gastrointestinal: No abdominal pain.  No nausea, no vomiting.  No diarrhea.  No constipation. Genitourinary: Negative for dysuria. Musculoskeletal: Negative for back pain. Skin: Negative for rash. Neurological: Negative for headaches, focal weakness or numbness.  10-point ROS otherwise negative.  ____________________________________________   PHYSICAL EXAM:  VITAL SIGNS: ED Triage Vitals  Enc Vitals Group     BP 09/02/15 0506 144/86 mmHg     Pulse Rate 09/02/15 0506 91     Resp 09/02/15 0506 20     Temp 09/02/15 0506 98 F (36.7 C)     Temp Source 09/02/15 0506 Oral     SpO2 09/02/15 0506 97 %     Weight 09/02/15 0506 166 lb (75.297 kg)     Height 09/02/15 0506 5\' 3"  (1.6 m)     Head Cir --      Peak Flow --      Pain Score --      Pain Loc --      Pain Edu? --      Excl. in GC? --     Constitutional: Alert and oriented. Well appearing and in no acute distress. Eyes: Conjunctivae are normal. PERRL. EOMI. Head: Atraumatic. Nose: No congestion/rhinnorhea. Mouth/Throat: Mucous membranes are moist.  Oropharynx non-erythematous. Neck: No stridor.   Cardiovascular: Normal rate, regular rhythm. Grossly normal heart sounds.  Good peripheral circulation. Respiratory: Normal respiratory effort.  No retractions. Lungs CTAB. Gastrointestinal: Soft and nontender. No distention. No abdominal bruits. No CVA tenderness. Musculoskeletal: No lower extremity tenderness nor edema.  No joint effusions. Neurologic:  Normal speech and language. No gross focal neurologic deficits are appreciated. No gait instability. Skin:  Skin is warm, dry and intact. No rash noted. Psychiatric: Mood and affect are normal. Speech and behavior are normal.  ____________________________________________   LABS (all labs ordered are listed, but only abnormal results are displayed)  Labs Reviewed - No  data to display ____________________________________________  EKG  None ____________________________________________  RADIOLOGY  None ____________________________________________   PROCEDURES  Procedure(s) performed: None    Critical Care performed: No  ____________________________________________   INITIAL IMPRESSION / ASSESSMENT AND PLAN / ED COURSE  Pertinent labs & imaging results that were available during my care of the patient were reviewed by me and considered in my medical decision making (see chart for details).  55 year old female who returns to the ED for recurrent nosebleed, left greater than right. Cotton ball soaked in lidocaine and Afrin solution and placed in both nares. Nasal clamp reapplied an ice pack applied to the bridge of nose. Will recheck in 20 minutes.  ----------------------------------------- 6:01 AM on 09/02/2015 -----------------------------------------  Cotton balls removed. Right nares within normal limits. Left nares is currently nonbleeding. There is a area of irritation posteriorly which is likely the source of patient's bleeding. Merocel applied. Will continue to monitor.  ----------------------------------------- 6:24 AM on 09/02/2015 -----------------------------------------  Patient sneezed and now having clots from the right nares which she has cleared. Will now pack the right naris with Merocel.  ----------------------------------------- 6:44 AM on 09/02/2015 -----------------------------------------  Patient began to bleed again, mainly from left nare. Merocel removed; Rhino Rocket saturated with TXA applied to left nares.   ----------------------------------------- 7:31 AM on 09/02/2015 -----------------------------------------  No active bleeding from left naris. Rhino Rocket remains in left naris. Merocel remains in right naris. Room air saturation 97%. Will maintain patient on Septra and she will see ENT in 5 days for  packing removal. Strict return precautions given. Patient and daughter verbalized understanding and agree with plan of care. ____________________________________________   FINAL CLINICAL IMPRESSION(S) / ED DIAGNOSES  Final diagnoses:  Epistaxis, recurrent      NEW MEDICATIONS STARTED DURING THIS VISIT:  New Prescriptions   No medications on file     Note:  This document was prepared using Dragon voice recognition software and may include unintentional dictation errors.    Irean Hong, MD 09/02/15 [phone]

## 2015-09-02 NOTE — ED Notes (Signed)
Pt presents w/ epistaxis, worse from L nare than R nare. Pt seen and treated for epistaxis yesterday. Pt used afrin this morning w/ some relief, but bleeding began again.

## 2015-09-02 NOTE — Discharge Instructions (Signed)
1. Take antibiotic as prescribed (Septra DS twice daily 5 days). 2. Return to the ER for recurrent nosebleed, persistent vomiting, feeling faint or other concerns.  Nosebleed Nosebleeds are common. They are due to a crack in the inside lining of your nose (mucous membrane) or from a small blood vessel that starts to bleed. Nosebleeds can be caused by many conditions, such as injury, infections, dry mucous membranes or dry climate, medicines, nose picking, and home heating and cooling systems. Most nosebleeds come from blood vessels in the front of your nose. HOME CARE INSTRUCTIONS   Try controlling your nosebleed by pinching your nostrils gently and continuously for at least 10 minutes.  Avoid blowing or sniffing your nose for a number of hours after having a nosebleed.  Do not put gauze inside your nose yourself. If your nose was packed by your health care provider, try to maintain the pack inside of your nose until your health care provider removes it.  If a gauze pack was used and it starts to fall out, gently replace it or cut off the end of it.  If a balloon catheter was used to pack your nose, do not cut or remove it unless your health care provider has instructed you to do that.  Avoid lying down while you are having a nosebleed. Sit up and lean forward.  Use a nasal spray decongestant to help with a nosebleed as directed by your health care provider.  Do not use petroleum jelly or mineral oil in your nose. These can drip into your lungs.  Maintain humidity in your home by using less air conditioning or by using a humidifier.  Aspirinand blood thinners make bleeding more likely. If you are prescribed these medicines and you suffer from nosebleeds, ask your health care provider if you should stop taking the medicines or adjust the dose. Do not stop medicines unless directed by your health care provider  Resume your normal activities as you are able, but avoid straining, lifting, or  bending at the waist for several days.  If your nosebleed was caused by dry mucous membranes, use over-the-counter saline nasal spray or gel. This will keep the mucous membranes moist and allow them to heal. If you must use a lubricant, choose the water-soluble variety. Use it only sparingly, and do not use it within several hours of lying down.  Keep all follow-up visits as directed by your health care provider. This is important. SEEK MEDICAL CARE IF:  You have a fever.  You get frequent nosebleeds.  You are getting nosebleeds more often. SEEK IMMEDIATE MEDICAL CARE IF:  Your nosebleed lasts longer than 20 minutes.  Your nosebleed occurs after an injury to your face, and your nose looks crooked or broken.  You have unusual bleeding from other parts of your body.  You have unusual bruising on other parts of your body.  You feel light-headed or you faint.  You become sweaty.  You vomit blood.  Your nosebleed occurs after a head injury.   This information is not intended to replace advice given to you by your health care provider. Make sure you discuss any questions you have with your health care provider.   Document Released: 12/11/2004 Document Revised: 03/24/2014 Document Reviewed: 10/17/2013 Elsevier Interactive Patient Education [employer]2016 Elsevier Inc.

## 2015-09-02 NOTE — Discharge Instructions (Signed)
Nosebleed Nosebleeds are common. They are due to a crack in the inside lining of your nose (mucous membrane) or from a small blood vessel that starts to bleed. Nosebleeds can be caused by many conditions, such as injury, infections, dry mucous membranes or dry climate, medicines, nose picking, and home heating and cooling systems. Most nosebleeds come from blood vessels in the front of your nose. HOME CARE INSTRUCTIONS   Try controlling your nosebleed by pinching your nostrils gently and continuously for at least 10 minutes.  Avoid blowing or sniffing your nose for a number of hours after having a nosebleed.  Do not put gauze inside your nose yourself. If your nose was packed by your health care provider, try to maintain the pack inside of your nose until your health care provider removes it.  If a gauze pack was used and it starts to fall out, gently replace it or cut off the end of it.  If a balloon catheter was used to pack your nose, do not cut or remove it unless your health care provider has instructed you to do that.  Avoid lying down while you are having a nosebleed. Sit up and lean forward.  Use a nasal spray decongestant to help with a nosebleed as directed by your health care provider.  Do not use petroleum jelly or mineral oil in your nose. These can drip into your lungs.  Maintain humidity in your home by using less air conditioning or by using a humidifier.  Aspirinand blood thinners make bleeding more likely. If you are prescribed these medicines and you suffer from nosebleeds, ask your health care provider if you should stop taking the medicines or adjust the dose. Do not stop medicines unless directed by your health care provider  Resume your normal activities as you are able, but avoid straining, lifting, or bending at the waist for several days.  If your nosebleed was caused by dry mucous membranes, use over-the-counter saline nasal spray or gel. This will keep the  mucous membranes moist and allow them to heal. If you must use a lubricant, choose the water-soluble variety. Use it only sparingly, and do not use it within several hours of lying down.  Keep all follow-up visits as directed by your health care provider. This is important. SEEK MEDICAL CARE IF:  You have a fever.  You get frequent nosebleeds.  You are getting nosebleeds more often. SEEK IMMEDIATE MEDICAL CARE IF:  Your nosebleed lasts longer than 20 minutes.  Your nosebleed occurs after an injury to your face, and your nose looks crooked or broken.  You have unusual bleeding from other parts of your body.  You have unusual bruising on other parts of your body.  You feel light-headed or you faint.  You become sweaty.  You vomit blood.  Your nosebleed occurs after a head injury.   This information is not intended to replace advice given to you by your health care provider. Make sure you discuss any questions you have with your health care provider.   Document Released: 12/11/2004 Document Revised: 03/24/2014 Document Reviewed: 10/17/2013 Elsevier Interactive Patient Education [employer] Elsevier Inc.   Please return immediately if condition worsens. Please contact her primary physician or the physician you were given for referral. If you have any specialist physicians involved in her treatment and plan please also contact them. Thank you for using [facility].

## 2015-09-02 NOTE — ED Notes (Signed)
Report to shannin.  

## 2015-09-02 NOTE — ED Notes (Signed)
Pt states that she was seen yesterday and again through the night, pt states that the bleeding is running down her throat and states that her nasal tampon came out when she was coughing, bleeding from bilat nares

## 2015-09-02 NOTE — ED Notes (Signed)
Pt sitting in bed with ice pack on nose and nasal clamps in place. Bleeding continues but has slowed.

## 2015-09-02 NOTE — ED Notes (Signed)
Pt given gingerale per request at this time.

## 2015-09-02 NOTE — ED Notes (Signed)
MD at bedside at this time to administer Afrin at this time. This RN at bedside as well.

## 2015-09-02 NOTE — ED Notes (Signed)
NAD noted at time of D/C. Pt denies questions or concerns. Pt ambulatory to the lobby at this time.  

## 2015-09-02 NOTE — ED Provider Notes (Signed)
Time Seen: Approximately 0 9:30  I have reviewed the triage notes  Chief Complaint: Epistaxis   History of Present Illness: [name] is a 55 y.o. female *who presents with repeat visit for epistaxis. Patient was here initially and had stoppage of her bleeding. States he was primarily in the left nasal cavity. She is currently on any prescription blood thinners and takes a baby aspirin per day. Patient returned here earlier this morning after bleeding restarted. She had both nasal cavities packed during her previous visit. She states she sneezed after the bleeding had already restarted and she had a Rhino Rocket in the left side that came out. The patient states active bleeding in the left nasal cavity. She states the right side seems to be stable at this point. Denies any feelings of lightheadedness, shortness of breath or near syncope. Past Medical History  Diagnosis Date  . Hypercholesteremia   . Hypertension   . Tachycardia     likely brief SVT  . Allergic rhinitis due to pollen   . Recurrent UTI   . Epistaxis, recurrent     Patient Active Problem List   Diagnosis Date Noted  . Gross hematuria 07/23/2015  . 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 Acute cystitis with hematuria 07/23/2015  . Recurrent UTI 11/09/2014  . Hypercholesteremia   . Hypertension   . Tachycardia   . Allergic rhinitis due to pollen     Past Surgical History  Procedure Laterality Date  . Laparoscopic appendectomy N/A 06/30/2013    Procedure: APPENDECTOMY LAPAROSCOPIC;  Surgeon: Shelly Rubenstein, MD;  Location: [facility] OR;  Service: General;  Laterality: N/A;    Past Surgical History  Procedure Laterality Date  . Laparoscopic appendectomy N/A 06/30/2013    Procedure: APPENDECTOMY LAPAROSCOPIC;  Surgeon: Shelly Rubenstein, MD;  Location: [facility] OR;  Service: General;  Laterality: N/A;    Current Outpatient Rx  Name  Route  Sig  Dispense  Refill  . Ascorbic Acid (VITAMIN C PO)   Oral   Take 500 Units by mouth  daily.          Marland Kitchen aspirin EC 81 MG tablet   Oral   Take 81 mg by mouth daily.         Marland Kitchen atorvastatin (LIPITOR) 10 MG tablet   Oral   Take 1 tablet (10 mg total) by mouth daily.   90 tablet   1   . B Complex Vitamins (VITAMIN B COMPLEX PO)   Oral   Take 1 tablet by mouth daily.         Marland Kitchen CALCIUM-VITAMIN D PO   Oral   Take 1 tablet by mouth daily.         . Chromium Picolinate (CHROMIUM PICOLATE) 1000 MCG TABS   Oral   Take by mouth daily.         Marland Kitchen CRANBERRY EXTRACT PO   Oral   Take 1-2 tablets by mouth daily.         Marland Kitchen MAGNESIUM PO   Oral   Take 400 mg by mouth daily.          . metoprolol succinate (TOPROL-XL) 50 MG 24 hr tablet   Oral   Take 1 tablet (50 mg total) by mouth daily. Take with or immediately following a meal.   90 tablet   1   . Multiple Vitamin (MULTIVITAMIN WITH MINERALS) TABS tablet   Oral   Take 1 tablet by mouth daily.         Marland Kitchen  Omega-3 Fatty Acids (FISH OIL PO)   Oral   Take 1,000 mg by mouth daily.          . phenazopyridine (PYRIDIUM) 100 MG tablet   Oral   Take 1 tablet (100 mg total) by mouth 3 (three) times daily as needed for pain. Patient not taking: Reported on 07/23/2015   10 tablet   0   . sulfamethoxazole-trimethoprim (BACTRIM DS,SEPTRA DS) 800-160 MG tablet   Oral   Take 1 tablet by mouth 2 (two) times daily.   10 tablet   0   . triamcinolone cream (KENALOG) 0.1 %               . VITAMIN E PO   Oral   Take 1 capsule by mouth daily.           Allergies:  Cephalexin  Family History: Family History  Problem Relation Age of Onset  . Heart disease Mother   . Diabetes Mother   . Arthritis Father   . Hyperlipidemia Sister   . Obesity Brother   . Cancer Maternal Grandmother     melanoma  . Heart disease Paternal Grandfather   . Kidney disease Neg Hx     Social History: Social History  Substance Use Topics  . Smoking status: Current Every Day Smoker -- 0.50 packs/day    Types:  Cigarettes  . Smokeless tobacco: Never Used  . Alcohol Use: 0.0 oz/week    0 Standard drinks or equivalent per week     Comment: rare alcohol     Review of Systems:   10 point review of systems was performed and was otherwise negative:  Constitutional: No fever Eyes: No visual disturbances ENT: No sore throat, ear pain Cardiac: No chest pain Respiratory: No shortness of breath, wheezing, or stridor Abdomen: No abdominal pain, no vomiting, No diarrhea Endocrine: No weight loss, No night sweats Extremities: No peripheral edema, cyanosis Skin: No rashes, easy bruising Neurologic: No focal weakness, trouble with speech or swollowing Urologic: No dysuria, Hematuria, or urinary frequency   Physical Exam:  ED Triage Vitals  Enc Vitals Group     BP 09/02/15 0922 148/78 mmHg     Pulse Rate 09/02/15 0922 84     Resp --      Temp 09/02/15 0922 98.3 F (36.8 C)     Temp Source 09/02/15 0922 Oral     SpO2 09/02/15 0922 96 %     Weight --      Height --      Head Cir --      Peak Flow --      Pain Score --      Pain Loc --      Pain Edu? --      Excl. in GC? --     General: Awake , Alert , and Oriented times 3; GCS 15 Head: Normal cephalic , atraumatic Eyes: Pupils equal , round, reactive to light Nose/Throat: Close examination of the left nasal cavity after the patient clearing her nose and after Afrin nasal spray shows some bleeding appears to be over the anterior septal region without any obvious polyps. No obvious posterior bleeding. Right nares does not show any active bleeding Neck: Supple, Full range of motion, No anterior adenopathy or palpable thyroid masses Lungs: Clear to ascultation without wheezes , rhonchi, or rales Heart: Regular rate, regular rhythm without murmurs , gallops , or rubs Abdomen: Soft, non tender without rebound, guarding , or rigidity; bowel  sounds positive and symmetric in all 4 quadrants. No organomegaly .        Extremities: 2 plus symmetric  pulses. No edema, clubbing or cyanosis Neurologic: normal ambulation, Motor symmetric without deficits, sensory intact Skin: warm, dry, no rashes   Procedures: ** Left-sided nasal packing. The left nasal canal shows anterior bleeding and I felt we could attempt another anterior packing. The patient had a Murocel gauze inserted with Surgicel wrapped around it and was moisturized and inflated with tranexamic acid and afrin nasal spray. The Murocel gauze was coated with bacitracin ointment. Patient appeared to tolerate the procedure well and the packing was taped in place. She was up and ambulatory here without any rebleeding. She does not appear to have any active bleeding from the right nares and I left the right nose unpacked at this time.    ED Course:  Patient's stay here was uneventful and she was advised to hold her aspirin for the next couple of days and was referred to the ENT physician for packing removal and close examination in the office. She was advised that she can always return here if she has recurrent bleeding and she was given the Afrin nasal spray to take with her to keep the packing moisturized. She was advised drink plenty of fluids   Assessment:  Left-sided epistaxis Nasal packing  Final Clinical Impression:  Final diagnoses:  Left-sided epistaxis     Plan:  Outpatient management Patient was advised to return immediately if condition worsens. Patient was advised to follow up with their primary care physician or other specialized physicians involved in their outpatient care. The patient and/or family member/power of attorney had laboratory results reviewed at the bedside. All questions and concerns were addressed and appropriate discharge instructions were distributed by the nursing staff.            Jennye MoccasinBrian S Markise Haymer, MD 09/02/15 [phone]

## 2015-09-11 ENCOUNTER — Encounter: Payer: Self-pay | Admitting: Internal Medicine

## 2015-09-11 ENCOUNTER — Ambulatory Visit (INDEPENDENT_AMBULATORY_CARE_PROVIDER_SITE_OTHER): Payer: PRIVATE HEALTH INSURANCE | Admitting: Internal Medicine

## 2015-09-11 VITALS — BP 130/82 | HR 74 | Temp 97.4°F | Ht 62.5 in | Wt 167.0 lb

## 2015-09-11 DIAGNOSIS — E7800 Pure hypercholesterolemia, unspecified: Secondary | ICD-10-CM

## 2015-09-11 DIAGNOSIS — Z0000 Encounter for general adult medical examination without abnormal findings: Secondary | ICD-10-CM | POA: Diagnosis not present

## 2015-09-11 DIAGNOSIS — I10 Essential (primary) hypertension: Secondary | ICD-10-CM | POA: Diagnosis not present

## 2015-09-11 LAB — LIPID PANEL
Cholesterol: 209 mg/dL — ABNORMAL HIGH (ref 0–200)
HDL: 45.4 mg/dL (ref >39.00)
NONHDL: 163.97
Total CHOL/HDL Ratio: 5
Triglycerides: 324 mg/dL — ABNORMAL HIGH (ref 0.0–149.0)
VLDL: 64.8 mg/dL — ABNORMAL HIGH (ref 0.0–40.0)

## 2015-09-11 LAB — CBC WITH DIFFERENTIAL/PLATELET
BASOS ABS: 0.1 10*3/uL (ref 0.0–0.1)
BASOS PCT: 1.5 % (ref 0.0–3.0)
EOS ABS: 0.2 10*3/uL (ref 0.0–0.7)
Eosinophils Relative: 2.2 % (ref 0.0–5.0)
HEMATOCRIT: 39.4 % (ref 36.0–46.0)
HEMOGLOBIN: 13.3 g/dL (ref 12.0–15.0)
LYMPHS PCT: 31.9 % (ref 12.0–46.0)
Lymphs Abs: 2.8 10*3/uL (ref 0.7–4.0)
MCHC: 33.8 g/dL (ref 30.0–36.0)
MCV: 91.7 fl (ref 78.0–100.0)
Monocytes Absolute: 0.5 10*3/uL (ref 0.1–1.0)
Monocytes Relative: 6.1 % (ref 3.0–12.0)
Neutro Abs: 5.2 10*3/uL (ref 1.4–7.7)
Neutrophils Relative %: 58.3 % (ref 43.0–77.0)
PLATELETS: 327 10*3/uL (ref 150.0–400.0)
RBC: 4.29 Mil/uL (ref 3.87–5.11)
RDW: 14.1 % (ref 11.5–15.5)
WBC: 8.9 10*3/uL (ref 4.0–10.5)

## 2015-09-11 LAB — COMPREHENSIVE METABOLIC PANEL
ALBUMIN: 4.5 g/dL (ref 3.5–5.2)
ALK PHOS: 90 U/L (ref 39–117)
ALT: 19 U/L (ref 0–35)
AST: 16 U/L (ref 0–37)
BILIRUBIN TOTAL: 0.4 mg/dL (ref 0.2–1.2)
BUN: 13 mg/dL (ref 6–23)
CALCIUM: 10.1 mg/dL (ref 8.4–10.5)
CHLORIDE: 104 meq/L (ref 96–112)
CO2: 29 mEq/L (ref 19–32)
CREATININE: 0.65 mg/dL (ref 0.40–1.20)
GFR: 99.96 mL/min (ref >60.00)
Glucose, Bld: 89 mg/dL (ref 70–99)
Potassium: 4.1 mEq/L (ref 3.5–5.1)
Sodium: 140 mEq/L (ref 135–145)
TOTAL PROTEIN: 7.3 g/dL (ref 6.0–8.3)

## 2015-09-11 LAB — T4, FREE: Free T4: 1.06 ng/dL (ref 0.60–1.60)

## 2015-09-11 LAB — LDL CHOLESTEROL, DIRECT: LDL DIRECT: 97 mg/dL

## 2015-09-11 NOTE — Progress Notes (Signed)
Subjective:    Patient ID: [name], female    DOB: 1960-09-11, 55 y.o.   MRN: [id number]  HPI Here for physical  No further UTIs Did have CT scan and cystoscopy All looked okay  Then had epistaxis--ER visit Did have ENT follow up Everything looked like it was healing up Still not back on aspirin---discussed using it only every other day Did get a humidifier--but still can awaken with sinus pressure  Sees gyn  Yearly mammogram Every 3 years for pap  Has cut down on smoking Afraid of chantix Discussed patch and lozenges  Current Outpatient Prescriptions on File Prior to Visit  Medication Sig Dispense Refill  . Ascorbic Acid (VITAMIN C PO) Take 500 Units by mouth daily.     Marland Kitchen. aspirin EC 81 MG tablet Take 81 mg by mouth daily.    Marland Kitchen. atorvastatin (LIPITOR) 10 MG tablet Take 1 tablet (10 mg total) by mouth daily. 90 tablet 1  . B Complex Vitamins (VITAMIN B COMPLEX PO) Take 1 tablet by mouth daily.    Marland Kitchen. CALCIUM-VITAMIN D PO Take 1 tablet by mouth daily.    . Chromium Picolinate (CHROMIUM PICOLATE) 1000 MCG TABS Take by mouth daily.    Marland Kitchen. CRANBERRY EXTRACT PO Take 1-2 tablets by mouth daily.    Marland Kitchen. MAGNESIUM PO Take 400 mg by mouth daily.     . metoprolol succinate (TOPROL-XL) 50 MG 24 hr tablet Take 1 tablet (50 mg total) by mouth daily. Take with or immediately following a meal. 90 tablet 1  . Multiple Vitamin (MULTIVITAMIN WITH MINERALS) TABS tablet Take 1 tablet by mouth daily.    . Omega-3 Fatty Acids (FISH OIL PO) Take 1,000 mg by mouth daily.     Marland Kitchen. triamcinolone cream (KENALOG) 0.1 %     . VITAMIN E PO Take 1 capsule by mouth daily.     No current facility-administered medications on file prior to visit.    Allergies  Allergen Reactions  . Cephalexin Hives and Rash    Past Medical History  Diagnosis Date  . Hypercholesteremia   . Hypertension   . Tachycardia     likely brief SVT  . Allergic rhinitis due to pollen   . Recurrent UTI   . Epistaxis,  recurrent     Past Surgical History  Procedure Laterality Date  . Laparoscopic appendectomy N/A 06/30/2013    Procedure: APPENDECTOMY LAPAROSCOPIC;  Surgeon: Shelly Rubensteinouglas A Blackman, MD;  Location: [facility] OR;  Service: General;  Laterality: N/A;    Family History  Problem Relation Age of Onset  . Heart disease Mother   . Diabetes Mother   . Arthritis Father   . Hyperlipidemia Sister   . Obesity Brother   . Cancer Maternal Grandmother     melanoma  . Heart disease Paternal Grandfather   . Kidney disease Neg Hx     Social History   Social History  . Marital Status: Widowed    Spouse Name: N/A  . Number of Children: 2  . Years of Education: N/A   Occupational History  . housewife    Social History Main Topics  . Smoking status: Current Every Day Smoker -- 0.50 packs/day    Types: Cigarettes  . Smokeless tobacco: Never Used  . Alcohol Use: 0.0 oz/week    0 Standard drinks or equivalent per week     Comment: rare alcohol  . Drug Use: No  . Sexual Activity: Not on file   Other Topics Concern  .  Not on file   Social History Narrative   2 daughters   Review of Systems  Constitutional: Positive for unexpected weight change. Negative for fatigue.       Weight up 10# since last year Tries to walk and does garden Wears seat belt  HENT: Negative for dental problem, hearing loss and tinnitus.        Keeps up with dentist  Eyes: Negative for visual disturbance.       No diplopia or unilateral vision loss  Respiratory: Negative for cough, chest tightness and shortness of breath.   Cardiovascular: Negative for chest pain, palpitations and leg swelling.  Gastrointestinal: Negative for nausea, vomiting, abdominal pain, constipation and blood in stool.       No heartburn  Endocrine: Negative for polydipsia and polyuria.  Genitourinary: Negative for dysuria, hematuria and dyspareunia.       Discussed safe sex  Musculoskeletal: Negative for back pain, joint swelling and arthralgias.         Left thumb triggers at times  Skin: Negative for rash.       No suspicious lesions   Allergic/Immunologic: Positive for environmental allergies. Negative for immunocompromised state.  Neurological: Negative for dizziness, syncope, light-headedness and headaches.  Hematological: Negative for adenopathy. Bruises/bleeds easily.  Psychiatric/Behavioral: Negative for sleep disturbance and dysphoric mood. The patient is not nervous/anxious.        Objective:   Physical Exam  Constitutional: She is oriented to person, place, and time. She appears well-developed and well-nourished. No distress.  HENT:  Head: Normocephalic and atraumatic.  Right Ear: External ear normal.  Left Ear: External ear normal.  Mouth/Throat: Oropharynx is clear and moist. No oropharyngeal exudate.  Eyes: Conjunctivae are normal. Pupils are equal, round, and reactive to light.  Neck: Normal range of motion. Neck supple. No thyromegaly present.  Cardiovascular: Normal rate, regular rhythm, normal heart sounds and intact distal pulses.  Exam reveals no gallop.   No murmur heard. Pulmonary/Chest: Effort normal and breath sounds normal. No respiratory distress. She has no wheezes. She has no rales.  Abdominal: Soft. There is no tenderness.  Musculoskeletal: She exhibits no edema or tenderness.  Lymphadenopathy:    She has no cervical adenopathy.  Neurological: She is alert and oriented to person, place, and time.  Skin: No rash noted. No erythema.  Psychiatric: She has a normal mood and affect. Her behavior is normal.          Assessment & Plan:

## 2015-09-11 NOTE — Assessment & Plan Note (Signed)
UTD on mammo and pap Colon due 2021 Discussed fitness Working on tobacco cessation

## 2015-09-11 NOTE — Progress Notes (Signed)
Pre visit review using our clinic review tool, if applicable. No additional management support is needed unless otherwise documented below in the visit note. 

## 2015-09-11 NOTE — Assessment & Plan Note (Signed)
No problems with statin 

## 2015-09-11 NOTE — Assessment & Plan Note (Signed)
BP Readings from Last 3 Encounters:  09/11/15 130/82  09/02/15 125/56  09/02/15 155/78   Good control

## 2015-09-11 NOTE — Patient Instructions (Signed)
Please start a daily nicotine patch--I recommend the highest dose (21mg  or 22mg ) If you have any urges, please try a nicotine lozenge (so that you don't ever smoke a cigarette)

## 2015-09-20 ENCOUNTER — Other Ambulatory Visit: Payer: Self-pay | Admitting: Internal Medicine

## 2015-10-23 ENCOUNTER — Other Ambulatory Visit: Payer: Self-pay | Admitting: Internal Medicine

## 2015-11-05 ENCOUNTER — Encounter: Payer: Self-pay | Admitting: Urology

## 2015-11-05 ENCOUNTER — Ambulatory Visit: Payer: 59 | Admitting: Urology

## 2015-11-29 ENCOUNTER — Encounter: Payer: Self-pay | Admitting: Urology

## 2015-11-29 ENCOUNTER — Ambulatory Visit (INDEPENDENT_AMBULATORY_CARE_PROVIDER_SITE_OTHER): Payer: 59 | Admitting: Urology

## 2015-11-29 VITALS — BP 169/72 | HR 91 | Ht 63.0 in | Wt 170.0 lb

## 2015-11-29 DIAGNOSIS — N2889 Other specified disorders of kidney and ureter: Secondary | ICD-10-CM

## 2015-11-29 DIAGNOSIS — R310 Gross hematuria: Secondary | ICD-10-CM

## 2015-11-29 DIAGNOSIS — N3001 Acute cystitis with hematuria: Secondary | ICD-10-CM

## 2015-11-29 NOTE — Progress Notes (Signed)
11/29/2015 10:11 AM   [name] 1960/08/10 [id number]  Referring provider: Karie Schwalbe, MD [address] [place], [place] [address]  Chief Complaint  Patient presents with  . Follow-up     month Renal Mass    HPI: Patient is a 55 -year-old Caucasian who presents today for a follow up after undergoing a hematuria workup three months ago.  Background history Patient was referral from their PCP, Dr. Sharen Hones, for cystitis.  Patient had an episode of gross hematuria a few months ago.  She has been having recurring UTI's over the last year.  I do not have a documented infection at this time.  She had an asymptomatic UTI last year diagnosed by her gynecologist.  She does not have a prior history of nephrolithiasis, trauma to the genitourinary tract or malignancies of the genitourinary tract.  She does not have a family medical history of nephrolithiasis, malignancies of the genitourinary tract or hematuria.  She underwent a CT of the abdomen and pelvis with contrast on 07/04/2013.  She was found to have a ruptured appendix at that time.  She did have small bilateral extrarenal pelves. A benign 1 cm Bosniak type I cyst in the left kidney. And a 4 mm nonenhancing peripheral nodule in the left kidney.  She is a smoker with a 30 ppd history.    Patient completed a hematuria work up with CT Urogram and cystoscopy three months ago.  CT Urogram performed on 08/07/2015 noted no findings identified to explain patient's hematuria.  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  Aortic atherosclerosis.  Cystocele.  Lumbar spondylosis.  Cystoscopy with Dr. Ronne Binning performed on 08/14/2015 was normal.    Today, she is not having symptoms of frequent urination and nocturia.   She does experience postvoid dribbling, but she is on the toilet when it occurs.   This is not bothersome to the patient.  She is not experiencing urgency, dysuria, incontinence, hesitancy, intermittency, straining to  urinate or a weak urinary stream.     She is not experiencing any suprapubic pain, flank pain or abdominal pain, but she has been having intermittent left flank pain.  She has not had any recent gross hematuria or UTI's.    PMH: Past Medical History:  Diagnosis Date  . Allergic rhinitis due to pollen   . Epistaxis, recurrent   . Hypercholesteremia   . Hypertension   . Recurrent UTI   . Tachycardia    likely brief SVT    Surgical History: Past Surgical History:  Procedure Laterality Date  . LAPAROSCOPIC APPENDECTOMY N/A 06/30/2013   Procedure: APPENDECTOMY LAPAROSCOPIC;  Surgeon: Shelly Rubenstein, MD;  Location: [facility] OR;  Service: General;  Laterality: N/A;    Home Medications:    Medication List       Accurate as of 11/29/15 10:11 AM. Always use your most recent med list.          aspirin EC 81 MG tablet Take 81 mg by mouth daily.   atorvastatin 10 MG tablet Commonly known as:  LIPITOR TAKE 1 TABLET (10 MG TOTAL) BY MOUTH DAILY.   CALCIUM-VITAMIN D PO Take 1 tablet by mouth daily.   Chromium Picolate 1000 MCG Tabs Take by mouth daily.   CRANBERRY EXTRACT PO Take 1-2 tablets by mouth daily.   FISH OIL PO Take 1,000 mg by mouth daily.   MAGNESIUM PO Take 400 mg by mouth daily.   metoprolol succinate 50 MG 24 hr  tablet Commonly known as:  TOPROL-XL TAKE 1 TABLET (50 MG TOTAL) BY MOUTH DAILY. TAKE WITH OR IMMEDIATELY FOLLOWING A MEAL.   multivitamin with minerals Tabs tablet Take 1 tablet by mouth daily.   triamcinolone cream 0.1 % Commonly known as:  KENALOG   VITAMIN B COMPLEX PO Take 1 tablet by mouth daily.   VITAMIN C PO Take 500 Units by mouth daily.   VITAMIN E PO Take 1 capsule by mouth daily.       Allergies:  Allergies  Allergen Reactions  . Cephalexin Hives and Rash    Family History: Family History  Problem Relation Age of Onset  . Heart disease Mother   . Diabetes Mother   . Arthritis Father   . Hyperlipidemia Sister   .  Obesity Brother   . Cancer Maternal Grandmother     melanoma  . Heart disease Paternal Grandfather   . Kidney disease Neg Hx     Social History:  reports that she has been smoking Cigarettes.  She has been smoking about 0.50 packs per day. She has never used smokeless tobacco. She reports that she drinks alcohol. She reports that she does not use drugs.  ROS: UROLOGY Frequent Urination?: No Hard to postpone urination?: No Burning/pain with urination?: No Get up at night to urinate?: No Leakage of urine?: No Urine stream starts and stops?: No Trouble starting stream?: No Do you have to strain to urinate?: No Blood in urine?: No Urinary tract infection?: No Sexually transmitted disease?: No Injury to kidneys or bladder?: No Painful intercourse?: No Weak stream?: No Currently pregnant?: No Vaginal bleeding?: No Last menstrual period?: n  Gastrointestinal Nausea?: No Vomiting?: No Indigestion/heartburn?: No Diarrhea?: No Constipation?: No  Constitutional Fever: No Night sweats?: No Weight loss?: No Fatigue?: No  Skin Skin rash/lesions?: No Itching?: No  Eyes Blurred vision?: No Double vision?: No  Ears/Nose/Throat Sore throat?: No Sinus problems?: No  Hematologic/Lymphatic Swollen glands?: No Easy bruising?: No  Cardiovascular Leg swelling?: No Chest pain?: No  Respiratory Cough?: No Shortness of breath?: No  Endocrine Excessive thirst?: No  Musculoskeletal Back pain?: No Joint pain?: No  Neurological Headaches?: No Dizziness?: No  Psychologic Depression?: No Anxiety?: No  Physical Exam: BP (!) 169/72   Pulse 91   Ht 5\' 3"  (1.6 m)   Wt 170 lb (77.1 kg)   BMI 30.11 kg/m   Constitutional: Well nourished. Alert and oriented, No acute distress. HEENT: [place] AT, moist mucus membranes. Trachea midline, no masses. Cardiovascular: No clubbing, cyanosis, or edema. Respiratory: Normal respiratory effort, no increased work of  breathing. Skin: No rashes, bruises or suspicious lesions. Lymph: No cervical or inguinal adenopathy. Neurologic: Grossly intact, no focal deficits, moving all 4 extremities. Psychiatric: Normal mood and affect.  Laboratory Data: Lab Results  Component Value Date   WBC 8.9 09/11/2015   HGB 13.3 09/11/2015   HCT 39.4 09/11/2015   MCV 91.7 09/11/2015   PLT 327.0 09/11/2015    Lab Results  Component Value Date   CREATININE 0.65 09/11/2015        Component Value Date/Time   CHOL 209 (H) 09/11/2015 1037   HDL 45.40 09/11/2015 1037   CHOLHDL 5 09/11/2015 1037   VLDL 64.8 (H) 09/11/2015 1037   LDLCALC 84 09/05/2014 1304    Lab Results  Component Value Date   AST 16 09/11/2015   Lab Results  Component Value Date   ALT 19 09/11/2015     Urinalysis Results for orders placed or  performed in visit on 09/11/15  Comprehensive metabolic panel  Result Value Ref Range   Sodium 140 135 - 145 mEq/L   Potassium 4.1 3.5 - 5.1 mEq/L   Chloride 104 96 - 112 mEq/L   CO2 29 19 - 32 mEq/L   Glucose, Bld 89 70 - 99 mg/dL   BUN 13 6 - 23 mg/dL   Creatinine, Ser 8.11 0.40 - 1.20 mg/dL   Total Bilirubin 0.4 0.2 - 1.2 mg/dL   Alkaline Phosphatase 90 39 - 117 U/L   AST 16 0 - 37 U/L   ALT 19 0 - 35 U/L   Total Protein 7.3 6.0 - 8.3 g/dL   Albumin 4.5 3.5 - 5.2 g/dL   Calcium 91.4 8.4 - 78.2 mg/dL   GFR 95.62 >13.08 mL/min  CBC with Differential/Platelet  Result Value Ref Range   WBC 8.9 4.0 - 10.5 K/uL   RBC 4.29 3.87 - 5.11 Mil/uL   Hemoglobin 13.3 12.0 - 15.0 g/dL   HCT 65.7 84.6 - 96.2 %   MCV 91.7 78.0 - 100.0 fl   MCHC 33.8 30.0 - 36.0 g/dL   RDW 95.2 84.1 - 32.4 %   Platelets 327.0 150.0 - 400.0 K/uL   Neutrophils Relative % 58.3 43.0 - 77.0 %   Lymphocytes Relative 31.9 12.0 - 46.0 %   Monocytes Relative 6.1 3.0 - 12.0 %   Eosinophils Relative 2.2 0.0 - 5.0 %   Basophils Relative 1.5 0.0 - 3.0 %   Neutro Abs 5.2 1.4 - 7.7 K/uL   Lymphs Abs 2.8 0.7 - 4.0 K/uL    Monocytes Absolute 0.5 0.1 - 1.0 K/uL   Eosinophils Absolute 0.2 0.0 - 0.7 K/uL   Basophils Absolute 0.1 0.0 - 0.1 K/uL  T4, free  Result Value Ref Range   Free T4 1.06 0.60 - 1.60 ng/dL  Lipid panel  Result Value Ref Range   Cholesterol 209 (H) 0 - 200 mg/dL   Triglycerides 401.0 (H) 0.0 - 149.0 mg/dL   HDL 27.25 >36.64 mg/dL   VLDL 40.3 (H) 0.0 - 47.4 mg/dL   Total CHOL/HDL Ratio 5    NonHDL 163.97   LDL cholesterol, direct  Result Value Ref Range   Direct LDL 97.0 mg/dL   Pertinent Imaging CLINICAL DATA:  Cystitis.  Gross hematuria.  EXAM: CT ABDOMEN AND PELVIS WITHOUT AND WITH CONTRAST  TECHNIQUE: Multidetector CT imaging of the abdomen and pelvis was performed following the standard protocol before and following the bolus administration of intravenous contrast.  CONTRAST:  ISOVUE-300 IOPAMIDOL (ISOVUE-300) INJECTION 61%  COMPARISON:  07/05/13  FINDINGS: Lower chest: Scar like densities noted within the lingula and right middle lobe. No pleural or pericardial effusion.  Hepatobiliary: There is no suspicious liver abnormality identified. The gallbladder is normal. No biliary dilatation.  Pancreas: Unremarkable appearance of the pancreas  Spleen: The spleen appears normal.  Adrenals/Urinary Tract: The adrenal glands are normal. No kidney stones identified. No calculi identified within the urinary bladder. Small cyst is noted within the upper pole of the left kidney. Scarring within the upper pole cortex of the left kidney is identified. On delayed imaging there is symmetric excretion of contrast material by both kidneys. No focal filling defects within the collecting systems, ureters or urinary bladder. Small cystocele is present.  Stomach/Bowel: The stomach is within normal limits. The small bowel loops have a normal course and caliber. No obstruction. Normal appearance of the colon.  Vascular/Lymphatic: Calcified atherosclerotic disease  involves the  abdominal aorta. No aneurysm. No enlarged retroperitoneal or mesenteric adenopathy. No enlarged pelvic or inguinal lymph nodes.  Reproductive: The uterus and adnexal structures are unremarkable.  Other: There is no ascites or focal fluid collections within the abdomen or pelvis.  Musculoskeletal: Degenerative disc disease is identified within the lumbar spine. Most advanced at L5-S1 were there are bilateral pars defects. Anterolisthesis of L5 on S1 noted.  IMPRESSION: 1. No findings identified to explain patient's hematuria. 2. 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 3. Aortic atherosclerosis 4. Cystocele 5. Lumbar spondylosis.   Electronically Signed   By: Signa Kell M.D.   On: 08/07/2015 09:50   Assessment & Plan:    1. Gross hematuria:     - completed hematuria work up with CT Urogram and cystoscopy- No GU malignancies were found  - urine cytology sent today  - patient to report any gross hematuria  - RTC in one year for UA   2. Left renal mass  - upper pole scarring involving the left kidney- no mass  3. Acute cystitis with hematuria  - per patient's report she has recurrent UTI's  - asked the patient to let our office manage her UTI's  - no symptoms at this time  - RTC in one year  Return in about 1 year (around 11/28/2016) for UA and symptom recheck.  These notes generated with voice recognition software. I apologize for typographical errors.  Michiel Cowboy, PA-C  [facility] [address], [address] [place], [place] [address] [phone]

## 2015-12-04 ENCOUNTER — Other Ambulatory Visit: Payer: Self-pay | Admitting: Urology

## 2015-12-05 ENCOUNTER — Telehealth: Payer: Self-pay

## 2015-12-05 NOTE — Telephone Encounter (Signed)
-----   Message from Harle BattiestShannon A McGowan, PA-C sent at 12/04/2015  8:56 PM EDT ----- Urine cytology is negative for cancer.  We will see her in one year.

## 2015-12-05 NOTE — Telephone Encounter (Signed)
LMOM

## 2015-12-06 NOTE — Telephone Encounter (Signed)
Spoke with pt in reference to cystology results. Pt was pleased. Pt voiced understanding.

## 2015-12-07 ENCOUNTER — Ambulatory Visit (INDEPENDENT_AMBULATORY_CARE_PROVIDER_SITE_OTHER): Payer: No Typology Code available for payment source

## 2015-12-07 DIAGNOSIS — Z23 Encounter for immunization: Secondary | ICD-10-CM

## 2016-08-15 ENCOUNTER — Other Ambulatory Visit: Payer: Self-pay | Admitting: Internal Medicine

## 2016-09-08 ENCOUNTER — Other Ambulatory Visit: Payer: Self-pay | Admitting: Internal Medicine

## 2016-09-12 ENCOUNTER — Encounter: Payer: PRIVATE HEALTH INSURANCE | Admitting: Internal Medicine

## 2016-09-23 ENCOUNTER — Ambulatory Visit (INDEPENDENT_AMBULATORY_CARE_PROVIDER_SITE_OTHER): Payer: PRIVATE HEALTH INSURANCE | Admitting: Internal Medicine

## 2016-09-23 ENCOUNTER — Encounter: Payer: Self-pay | Admitting: Internal Medicine

## 2016-09-23 VITALS — BP 138/84 | HR 78 | Temp 98.5°F | Ht 62.75 in | Wt 169.0 lb

## 2016-09-23 DIAGNOSIS — Z0000 Encounter for general adult medical examination without abnormal findings: Secondary | ICD-10-CM | POA: Diagnosis not present

## 2016-09-23 DIAGNOSIS — E7800 Pure hypercholesterolemia, unspecified: Secondary | ICD-10-CM | POA: Diagnosis not present

## 2016-09-23 DIAGNOSIS — I10 Essential (primary) hypertension: Secondary | ICD-10-CM

## 2016-09-23 LAB — CBC WITH DIFFERENTIAL/PLATELET
BASOS PCT: 2.5 % (ref 0.0–3.0)
Basophils Absolute: 0.2 10*3/uL — ABNORMAL HIGH (ref 0.0–0.1)
EOS PCT: 2.5 % (ref 0.0–5.0)
Eosinophils Absolute: 0.2 10*3/uL (ref 0.0–0.7)
HCT: 41.3 % (ref 36.0–46.0)
Hemoglobin: 14.3 g/dL (ref 12.0–15.0)
Lymphocytes Relative: 32.5 % (ref 12.0–46.0)
Lymphs Abs: 2.4 10*3/uL (ref 0.7–4.0)
MCHC: 34.7 g/dL (ref 30.0–36.0)
MCV: 92 fl (ref 78.0–100.0)
MONO ABS: 0.5 10*3/uL (ref 0.1–1.0)
Monocytes Relative: 7 % (ref 3.0–12.0)
Neutro Abs: 4 10*3/uL (ref 1.4–7.7)
Neutrophils Relative %: 55.5 % (ref 43.0–77.0)
Platelets: 259 10*3/uL (ref 150.0–400.0)
RBC: 4.49 Mil/uL (ref 3.87–5.11)
RDW: 13.1 % (ref 11.5–15.5)
WBC: 7.3 10*3/uL (ref 4.0–10.5)

## 2016-09-23 LAB — COMPREHENSIVE METABOLIC PANEL
ALBUMIN: 4.5 g/dL (ref 3.5–5.2)
ALK PHOS: 98 U/L (ref 39–117)
ALT: 18 U/L (ref 0–35)
AST: 17 U/L (ref 0–37)
BUN: 12 mg/dL (ref 6–23)
CHLORIDE: 105 meq/L (ref 96–112)
CO2: 26 mEq/L (ref 19–32)
Calcium: 9.9 mg/dL (ref 8.4–10.5)
Creatinine, Ser: 0.72 mg/dL (ref 0.40–1.20)
GFR: 88.51 mL/min (ref >60.00)
Glucose, Bld: 99 mg/dL (ref 70–99)
POTASSIUM: 4.1 meq/L (ref 3.5–5.1)
Sodium: 140 mEq/L (ref 135–145)
TOTAL PROTEIN: 7.2 g/dL (ref 6.0–8.3)
Total Bilirubin: 0.5 mg/dL (ref 0.2–1.2)

## 2016-09-23 LAB — LIPID PANEL
CHOL/HDL RATIO: 4
CHOLESTEROL: 213 mg/dL — AB (ref 0–200)
HDL: 48 mg/dL (ref >39.00)
NonHDL: 165.31
Triglycerides: 352 mg/dL — ABNORMAL HIGH (ref 0.0–149.0)
VLDL: 70.4 mg/dL — AB (ref 0.0–40.0)

## 2016-09-23 LAB — LDL CHOLESTEROL, DIRECT: Direct LDL: 102 mg/dL

## 2016-09-23 NOTE — Patient Instructions (Signed)
DASH Eating Plan DASH stands for "Dietary Approaches to Stop Hypertension." The DASH eating plan is a healthy eating plan that has been shown to reduce high blood pressure (hypertension). It may also reduce your risk for type 2 diabetes, heart disease, and stroke. The DASH eating plan may also help with weight loss. What are tips for following this plan? General guidelines  Avoid eating more than 2,300 mg (milligrams) of salt (sodium) a day. If you have hypertension, you may need to reduce your sodium intake to 1,500 mg a day.  Limit alcohol intake to no more than 1 drink a day for nonpregnant women and 2 drinks a day for men. One drink equals 12 oz of beer, 5 oz of wine, or 1 oz of hard liquor.  Work with your health care provider to maintain a healthy body weight or to lose weight. Ask what an ideal weight is for you.  Get at least 30 minutes of exercise that causes your heart to beat faster (aerobic exercise) most days of the week. Activities may include walking, swimming, or biking.  Work with your health care provider or diet and nutrition specialist (dietitian) to adjust your eating plan to your individual calorie needs. Reading food labels  Check food labels for the amount of sodium per serving. Choose foods with less than 5 percent of the Daily Value of sodium. Generally, foods with less than 300 mg of sodium per serving fit into this eating plan.  To find whole grains, look for the word "whole" as the first word in the ingredient list. Shopping  Buy products labeled as "low-sodium" or "no salt added."  Buy fresh foods. Avoid canned foods and premade or frozen meals. Cooking  Avoid adding salt when cooking. Use salt-free seasonings or herbs instead of table salt or sea salt. Check with your health care provider or pharmacist before using salt substitutes.  Do not fry foods. Cook foods using healthy methods such as baking, boiling, grilling, and broiling instead.  Cook with  heart-healthy oils, such as olive, canola, soybean, or sunflower oil. Meal planning   Eat a balanced diet that includes: ? 5 or more servings of fruits and vegetables each day. At each meal, try to fill half of your plate with fruits and vegetables. ? Up to 6-8 servings of whole grains each day. ? Less than 6 oz of lean meat, poultry, or fish each day. A 3-oz serving of meat is about the same size as a deck of cards. One egg equals 1 oz. ? 2 servings of low-fat dairy each day. ? A serving of nuts, seeds, or beans 5 times each week. ? Heart-healthy fats. Healthy fats called Omega-3 fatty acids are found in foods such as flaxseeds and coldwater fish, like sardines, salmon, and mackerel.  Limit how much you eat of the following: ? Canned or prepackaged foods. ? Food that is high in trans fat, such as fried foods. ? Food that is high in saturated fat, such as fatty meat. ? Sweets, desserts, sugary drinks, and other foods with added sugar. ? Full-fat dairy products.  Do not salt foods before eating.  Try to eat at least 2 vegetarian meals each week.  Eat more home-cooked food and less restaurant, buffet, and fast food.  When eating at a restaurant, ask that your food be prepared with less salt or no salt, if possible. What foods are recommended? The items listed may not be a complete list. Talk with your dietitian about what   dietary choices are best for you. Grains Whole-grain or whole-wheat bread. Whole-grain or whole-wheat pasta. Brown rice. Oatmeal. Quinoa. Bulgur. Whole-grain and low-sodium cereals. Pita bread. Low-fat, low-sodium crackers. Whole-wheat flour tortillas. Vegetables Fresh or frozen vegetables (raw, steamed, roasted, or grilled). Low-sodium or reduced-sodium tomato and vegetable juice. Low-sodium or reduced-sodium tomato sauce and tomato paste. Low-sodium or reduced-sodium canned vegetables. Fruits All fresh, dried, or frozen fruit. Canned fruit in natural juice (without  added sugar). Meat and other protein foods Skinless chicken or turkey. Ground chicken or turkey. Pork with fat trimmed off. Fish and seafood. Egg whites. Dried beans, peas, or lentils. Unsalted nuts, nut butters, and seeds. Unsalted canned beans. Lean cuts of beef with fat trimmed off. Low-sodium, lean deli meat. Dairy Low-fat (1%) or fat-free (skim) milk. Fat-free, low-fat, or reduced-fat cheeses. Nonfat, low-sodium ricotta or cottage cheese. Low-fat or nonfat yogurt. Low-fat, low-sodium cheese. Fats and oils Soft margarine without trans fats. Vegetable oil. Low-fat, reduced-fat, or light mayonnaise and salad dressings (reduced-sodium). Canola, safflower, olive, soybean, and sunflower oils. Avocado. Seasoning and other foods Herbs. Spices. Seasoning mixes without salt. Unsalted popcorn and pretzels. Fat-free sweets. What foods are not recommended? The items listed may not be a complete list. Talk with your dietitian about what dietary choices are best for you. Grains Baked goods made with fat, such as croissants, muffins, or some breads. Dry pasta or rice meal packs. Vegetables Creamed or fried vegetables. Vegetables in a cheese sauce. Regular canned vegetables (not low-sodium or reduced-sodium). Regular canned tomato sauce and paste (not low-sodium or reduced-sodium). Regular tomato and vegetable juice (not low-sodium or reduced-sodium). Pickles. Olives. Fruits Canned fruit in a light or heavy syrup. Fried fruit. Fruit in cream or butter sauce. Meat and other protein foods Fatty cuts of meat. Ribs. Fried meat. Bacon. Sausage. Bologna and other processed lunch meats. Salami. Fatback. Hotdogs. Bratwurst. Salted nuts and seeds. Canned beans with added salt. Canned or smoked fish. Whole eggs or egg yolks. Chicken or turkey with skin. Dairy Whole or 2% milk, cream, and half-and-half. Whole or full-fat cream cheese. Whole-fat or sweetened yogurt. Full-fat cheese. Nondairy creamers. Whipped toppings.  Processed cheese and cheese spreads. Fats and oils Butter. Stick margarine. Lard. Shortening. Ghee. Bacon fat. Tropical oils, such as coconut, palm kernel, or palm oil. Seasoning and other foods Salted popcorn and pretzels. Onion salt, garlic salt, seasoned salt, table salt, and sea salt. Worcestershire sauce. Tartar sauce. Barbecue sauce. Teriyaki sauce. Soy sauce, including reduced-sodium. Steak sauce. Canned and packaged gravies. Fish sauce. Oyster sauce. Cocktail sauce. Horseradish that you find on the shelf. Ketchup. Mustard. Meat flavorings and tenderizers. Bouillon cubes. Hot sauce and Tabasco sauce. Premade or packaged marinades. Premade or packaged taco seasonings. Relishes. Regular salad dressings. Where to find more information:  National Heart, Lung, and Blood Institute: www.nhlbi.nih.gov  American Heart Association: www.heart.org Summary  The DASH eating plan is a healthy eating plan that has been shown to reduce high blood pressure (hypertension). It may also reduce your risk for type 2 diabetes, heart disease, and stroke.  With the DASH eating plan, you should limit salt (sodium) intake to 2,300 mg a day. If you have hypertension, you may need to reduce your sodium intake to 1,500 mg a day.  When on the DASH eating plan, aim to eat more fresh fruits and vegetables, whole grains, lean proteins, low-fat dairy, and heart-healthy fats.  Work with your health care provider or diet and nutrition specialist (dietitian) to adjust your eating plan to your individual   calorie needs. This information is not intended to replace advice given to you by your health care provider. Make sure you discuss any questions you have with your health care provider. Document Released: 02/20/2011 Document Revised: 02/25/2016 Document Reviewed: 02/25/2016 Elsevier Interactive Patient Education  2017 Elsevier Inc.  

## 2016-09-23 NOTE — Assessment & Plan Note (Signed)
BP Readings from Last 3 Encounters:  09/23/16 138/84  11/29/15 (!) 169/72  09/11/15 130/82   Good control

## 2016-09-23 NOTE — Assessment & Plan Note (Signed)
No problems with statin for primary prevention 

## 2016-09-23 NOTE — Assessment & Plan Note (Signed)
Healthy Discussed DASH eating and exercise Discussed weaning off the nicotine and then the e-cigarettes completely Colon due 2021 UTD on mammo and pap per gyn

## 2016-09-23 NOTE — Progress Notes (Signed)
Subjective:    Patient ID: [name], female    DOB: [birth year], 58 y.o.   MRN: [id number]  HPI Here for physical Doing okay Likes to cook and eat [place] to walk Still smokes but has changed to e-cigarette  No recent urinary problems Keeps up with gyn and urologist UTD on pap--has mammogram yearly (following a lesion with ultrasound also)  Continues on cholesterol and BP meds  Current Outpatient Prescriptions on File Prior to Visit  Medication Sig Dispense Refill  . Ascorbic Acid (VITAMIN C PO) Take 500 Units by mouth daily.     Marland Kitchen. aspirin EC 81 MG tablet Take 81 mg by mouth daily.    Marland Kitchen. atorvastatin (LIPITOR) 10 MG tablet TAKE 1 TABLET (10 MG TOTAL) BY MOUTH DAILY. 90 tablet 0  . B Complex Vitamins (VITAMIN B COMPLEX PO) Take 1 tablet by mouth daily.    Marland Kitchen. CALCIUM-VITAMIN D PO Take 1 tablet by mouth daily.    . Chromium Picolinate (CHROMIUM PICOLATE) 1000 MCG TABS Take by mouth daily.    Marland Kitchen. CRANBERRY EXTRACT PO Take 1-2 tablets by mouth daily.    Marland Kitchen. MAGNESIUM PO Take 400 mg by mouth daily.     . metoprolol succinate (TOPROL-XL) 50 MG 24 hr tablet TAKE 1 TABLET (50 MG TOTAL) BY MOUTH DAILY. TAKE WITH OR IMMEDIATELY FOLLOWING A MEAL. 90 tablet 0  . Multiple Vitamin (MULTIVITAMIN WITH MINERALS) TABS tablet Take 1 tablet by mouth daily.    . Omega-3 Fatty Acids (FISH OIL PO) Take 1,000 mg by mouth daily.     Marland Kitchen. triamcinolone cream (KENALOG) 0.1 %     . VITAMIN E PO Take 1 capsule by mouth daily.     No current facility-administered medications on file prior to visit.     Allergies  Allergen Reactions  . Cephalexin Hives and Rash    Past Medical History:  Diagnosis Date  . Allergic rhinitis due to pollen   . Epistaxis, recurrent   . Hypercholesteremia   . Hypertension   . Recurrent UTI   . Tachycardia    likely brief SVT    Past Surgical History:  Procedure Laterality Date  . LAPAROSCOPIC APPENDECTOMY N/A 06/30/2013   Procedure:  APPENDECTOMY LAPAROSCOPIC;  Surgeon: Shelly Rubensteinouglas A Blackman, MD;  Location: [facility] OR;  Service: General;  Laterality: N/A;    Family History  Problem Relation Age of Onset  . Heart disease Mother   . Diabetes Mother   . Arthritis Father   . Hyperlipidemia Sister   . Obesity Brother   . Cancer Maternal Grandmother        melanoma  . Heart disease Paternal Grandfather   . Kidney disease Neg Hx     Social History   Social History  . Marital status: Widowed    Spouse name: N/A  . Number of children: 2  . Years of education: N/A   Occupational History  . housewife    Social History Main Topics  . Smoking status: Current Every Day Smoker    Packs/day: 0.50    Types: Cigarettes  . Smokeless tobacco: Never Used  . Alcohol use 0.0 oz/week     Comment: rare alcohol  . Drug use: No  . Sexual activity: Not on file   Other Topics Concern  . Not on file   Social History Narrative   2 daughters   Review of Systems  Constitutional: Negative for fatigue and unexpected weight change.  Wears seat belt  HENT: Negative for dental problem, hearing loss and tinnitus.        Uses cream on dry spots in ear Keeps up with dentist  Eyes: Negative for visual disturbance.       No diplopia or unilateral vision loss  Respiratory: Negative for cough, chest tightness and shortness of breath.   Cardiovascular: Negative for chest pain, palpitations and leg swelling.  Gastrointestinal: Negative for abdominal pain, blood in stool, constipation and nausea.  Endocrine: Negative for polydipsia and polyuria.  Genitourinary: Negative for difficulty urinating, dysuria and hematuria.       No sex--no problem  Musculoskeletal: Negative for arthralgias and joint swelling.       Back pain if she gets tired  Skin: Negative for rash.       No suspicious lesions  Allergic/Immunologic: Positive for environmental allergies. Negative for immunocompromised state.  Neurological: Negative for dizziness, syncope,  light-headedness and headaches.  Hematological: Negative for adenopathy. Does not bruise/bleed easily.  Psychiatric/Behavioral: Negative for dysphoric mood and sleep disturbance. The patient is not nervous/anxious.        Objective:   Physical Exam  Constitutional: She is oriented to person, place, and time. She appears well-developed and well-nourished. No distress.  HENT:  Head: Normocephalic and atraumatic.  Right Ear: External ear normal.  Left Ear: External ear normal.  Mouth/Throat: Oropharynx is clear and moist. No oropharyngeal exudate.  Eyes: Conjunctivae are normal. Pupils are equal, round, and reactive to light.  Neck: Normal range of motion. No thyromegaly present.  Cardiovascular: Normal rate, regular rhythm, normal heart sounds and intact distal pulses.  Exam reveals no gallop.   No murmur heard. Pulmonary/Chest: Effort normal and breath sounds normal. No respiratory distress. She has no wheezes. She has no rales.  Abdominal: Soft. There is no tenderness.  Musculoskeletal: She exhibits no edema or tenderness.  Lymphadenopathy:    She has no cervical adenopathy.  Neurological: She is alert and oriented to person, place, and time.  Skin: No rash noted. No erythema.  Psychiatric: She has a normal mood and affect. Her behavior is normal.          Assessment & Plan:

## 2016-10-24 ENCOUNTER — Encounter: Payer: Self-pay | Admitting: Internal Medicine

## 2016-11-09 ENCOUNTER — Other Ambulatory Visit: Payer: Self-pay | Admitting: Internal Medicine

## 2016-11-26 NOTE — Progress Notes (Signed)
11/27/2016 11:36 AM   [name] [birth year] [id number]  Referring provider: Karie Schwalbe, MD [address] [place], [place] [address]  Chief Complaint  Patient presents with  . Hematuria    1 year follow up  . Cystitis    HPI: Patient is a 56 -year-old Caucasian with a history of hematuria who presents today for a 1 year follow-up.  Background history Patient was referral from their PCP, Dr. Sharen Hones, for cystitis.  Patient had an episode of gross hematuria a few months ago.  She has been having recurring UTI's over the last year.  I do not have a documented infection at this time.  She had an asymptomatic UTI last year diagnosed by her gynecologist.  She does not have a prior history of nephrolithiasis, trauma to the genitourinary tract or malignancies of the genitourinary tract.  She does not have a family medical history of nephrolithiasis, malignancies of the genitourinary tract or hematuria.  She underwent a CT of the abdomen and pelvis with contrast on 07/04/2013.  She was found to have a ruptured appendix at that time.  She did have small bilateral extrarenal pelves. A benign 1 cm Bosniak type I cyst in the left kidney. And a 4 mm nonenhancing peripheral nodule in the left kidney.  She is a smoker with a 30 ppd history.  Patient completed a hematuria work up with CT Urogram and cystoscopy.  CT Urogram performed on 08/07/2015 noted no findings identified to explain patient's hematuria.  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  Aortic atherosclerosis.  Cystocele.  Lumbar spondylosis.  Cystoscopy with Dr. Ronne Binning performed on 08/14/2015 was normal.    Today, she is having symptoms of nocturia and intermittency.  She is still having to lean forward to completely empty her bladder at times. This is not bothersome to the patient.   She is not experiencing any suprapubic pain, flank pain or abdominal pain, but she has been having intermittent left flank pain.  She  has not had any recent gross hematuria or UTI's.  Her UA today is unremarkable.  PMH: Past Medical History:  Diagnosis Date  . Allergic rhinitis due to pollen   . Epistaxis, recurrent   . Hypercholesteremia   . Hypertension   . Recurrent UTI   . Tachycardia    likely brief SVT    Surgical History: Past Surgical History:  Procedure Laterality Date  . LAPAROSCOPIC APPENDECTOMY N/A 06/30/2013   Procedure: APPENDECTOMY LAPAROSCOPIC;  Surgeon: Shelly Rubenstein, MD;  Location: [facility] OR;  Service: General;  Laterality: N/A;    Home Medications:  Allergies as of 11/27/2016      Reactions   Cephalexin Hives, Rash      Medication List       Accurate as of 11/27/16 11:36 AM. Always use your most recent med list.          aspirin EC 81 MG tablet Take 81 mg by mouth daily.   atorvastatin 10 MG tablet Commonly known as:  LIPITOR TAKE 1 TABLET (10 MG TOTAL) BY MOUTH DAILY.   CALCIUM-VITAMIN D PO Take 1 tablet by mouth daily.   Chromium Picolate 1000 MCG Tabs Take by mouth daily.   CRANBERRY EXTRACT PO Take 1-2 tablets by mouth daily.   FISH OIL PO Take 1,000 mg by mouth daily.   MAGNESIUM PO Take 400 mg by mouth daily.   metoprolol succinate 50 MG 24 hr tablet Commonly known as:  TOPROL-XL TAKE  1 TABLET (50 MG TOTAL) BY MOUTH DAILY. TAKE WITH OR IMMEDIATELY FOLLOWING A MEAL.   multivitamin with minerals Tabs tablet Take 1 tablet by mouth daily.   triamcinolone cream 0.1 % Commonly known as:  KENALOG   VITAMIN B COMPLEX PO Take 1 tablet by mouth daily.   VITAMIN C PO Take 500 Units by mouth daily.   VITAMIN E PO Take 1 capsule by mouth daily.            Discharge Care Instructions        Start     Ordered   11/27/16 0000  Urinalysis, Complete     11/27/16 1100      Allergies:  Allergies  Allergen Reactions  . Cephalexin Hives and Rash    Family History: Family History  Problem Relation Age of Onset  . Heart disease Mother   . Diabetes  Mother   . Arthritis Father   . Hyperlipidemia Sister   . Obesity Brother   . Cancer Maternal Grandmother        melanoma  . Heart disease Paternal Grandfather   . Kidney disease Neg Hx   . Kidney cancer Neg Hx   . Bladder Cancer Neg Hx     Social History:  reports that she has been smoking E-cigarettes and Cigarettes.  She has never used smokeless tobacco. She reports that she drinks alcohol. She reports that she does not use drugs.  ROS: UROLOGY Frequent Urination?: No Hard to postpone urination?: No Burning/pain with urination?: No Get up at night to urinate?: Yes Leakage of urine?: No Urine stream starts and stops?: Yes Trouble starting stream?: No Do you have to strain to urinate?: No Blood in urine?: No Urinary tract infection?: No Sexually transmitted disease?: No Injury to kidneys or bladder?: No Painful intercourse?: No Weak stream?: No Currently pregnant?: No Vaginal bleeding?: No Last menstrual period?: n  Gastrointestinal Nausea?: No Vomiting?: No Indigestion/heartburn?: No Diarrhea?: No Constipation?: No  Constitutional Fever: No Night sweats?: No Weight loss?: No Fatigue?: No  Skin Skin rash/lesions?: No Itching?: No  Eyes Blurred vision?: No Double vision?: No  Ears/Nose/Throat Sore throat?: No Sinus problems?: No  Hematologic/Lymphatic Swollen glands?: No Easy bruising?: No  Cardiovascular Leg swelling?: No Chest pain?: No  Respiratory Cough?: No Shortness of breath?: No  Endocrine Excessive thirst?: No  Musculoskeletal Back pain?: No Joint pain?: No  Neurological Headaches?: No Dizziness?: No  Psychologic Depression?: No Anxiety?: No  Physical Exam: BP 137/81   Pulse 85   Ht  (1.6 m)   Wt 168 lb 14.4 oz (76.6 kg)   BMI 29.92 kg/m   Constitutional: Well nourished. Alert and oriented, No acute distress. HEENT: [place] AT, moist mucus membranes. Trachea midline, no masses. Cardiovascular: No clubbing,  cyanosis, or edema. Respiratory: Normal respiratory effort, no increased work of breathing. Skin: No rashes, bruises or suspicious lesions. Lymph: No cervical or inguinal adenopathy. Neurologic: Grossly intact, no focal deficits, moving all 4 extremities. Psychiatric: Normal mood and affect.  Laboratory Data: Lab Results  Component Value Date   WBC 7.3 09/23/2016   HGB 14.3 09/23/2016   HCT 41.3 09/23/2016   MCV 92.0 09/23/2016   PLT 259.0 09/23/2016    Lab Results  Component Value Date   CREATININE 0.72 09/23/2016        Component Value Date/Time   CHOL 213 (H) 09/23/2016 0957   HDL 48.00 09/23/2016 0957   CHOLHDL 4 09/23/2016 0957   VLDL 70.4 (H) 09/23/2016 0957  LDLCALC 84 09/05/2014 1304    Lab Results  Component Value Date   AST 17 09/23/2016   Lab Results  Component Value Date   ALT 18 09/23/2016    Results for orders placed or performed in visit on 09/23/16  Comprehensive metabolic panel  Result Value Ref Range   Sodium 140 135 - 145 mEq/L   Potassium 4.1 3.5 - 5.1 mEq/L   Chloride 105 96 - 112 mEq/L   CO2 26 19 - 32 mEq/L   Glucose, Bld 99 70 - 99 mg/dL   BUN 12 6 - 23 mg/dL   Creatinine, Ser 7.820.72 0.40 - 1.20 mg/dL   Total Bilirubin 0.5 0.2 - 1.2 mg/dL   Alkaline Phosphatase 98 39 - 117 U/L   AST 17 0 - 37 U/L   ALT 18 0 - 35 U/L   Total Protein 7.2 6.0 - 8.3 g/dL   Albumin 4.5 3.5 - 5.2 g/dL   Calcium 9.9 8.4 - 95.610.5 mg/dL   GFR 21.3088.51 >86.57>60.00 mL/min  CBC with Differential/Platelet  Result Value Ref Range   WBC 7.3 4.0 - 10.5 K/uL   RBC 4.49 3.87 - 5.11 Mil/uL   Hemoglobin 14.3 12.0 - 15.0 g/dL   HCT 84.641.3 96.236.0 - 95.246.0 %   MCV 92.0 78.0 - 100.0 fl   MCHC 34.7 30.0 - 36.0 g/dL   RDW 84.113.1 32.411.5 - 40.115.5 %   Platelets 259.0 150.0 - 400.0 K/uL   Neutrophils Relative % 55.5 43.0 - 77.0 %   Lymphocytes Relative 32.5 12.0 - 46.0 %   Monocytes Relative 7.0 3.0 - 12.0 %   Eosinophils Relative 2.5 0.0 - 5.0 %   Basophils Relative 2.5 0.0 - 3.0 %    Neutro Abs 4.0 1.4 - 7.7 K/uL   Lymphs Abs 2.4 0.7 - 4.0 K/uL   Monocytes Absolute 0.5 0.1 - 1.0 K/uL   Eosinophils Absolute 0.2 0.0 - 0.7 K/uL   Basophils Absolute 0.2 (H) 0.0 - 0.1 K/uL  Lipid panel  Result Value Ref Range   Cholesterol 213 (H) 0 - 200 mg/dL   Triglycerides 027.2352.0 (H) 0.0 - 149.0 mg/dL   HDL 53.6648.00 >44.03>39.00 mg/dL   VLDL 47.470.4 (H) 0.0 - 25.940.0 mg/dL   Total CHOL/HDL Ratio 4    NonHDL 165.31   LDL cholesterol, direct  Result Value Ref Range   Direct LDL 102.0 mg/dL    I have reviewed the labs   Assessment & Plan:    1. History of hematuria:     - completed hematuria work up with CT Urogram and cystoscopy in 2017- no GU malignancies were found  - patient to report any gross hematuria  - RTC in one year for UA   2. Cystocele  - Patient given a handout on Kegel exercises   Return in about 1 year (around 11/27/2017) for recheck for hematuria.  These notes generated with voice recognition software. I apologize for typographical errors.  Michiel CowboySHANNON Katonya Blecher, PA-C  [facility] [address], [address] [place], [place] [address] [phone]

## 2016-11-27 ENCOUNTER — Ambulatory Visit (INDEPENDENT_AMBULATORY_CARE_PROVIDER_SITE_OTHER): Payer: PRIVATE HEALTH INSURANCE | Admitting: Urology

## 2016-11-27 ENCOUNTER — Encounter: Payer: Self-pay | Admitting: Urology

## 2016-11-27 VITALS — BP 137/81 | HR 85 | Ht 63.0 in | Wt 168.9 lb

## 2016-11-27 DIAGNOSIS — Z87448 Personal history of other diseases of urinary system: Secondary | ICD-10-CM

## 2016-11-27 DIAGNOSIS — N8111 Cystocele, midline: Secondary | ICD-10-CM

## 2016-11-27 LAB — MICROSCOPIC EXAMINATION

## 2016-11-27 LAB — URINALYSIS, COMPLETE
BILIRUBIN UA: NEGATIVE
Glucose, UA: NEGATIVE
KETONES UA: NEGATIVE
NITRITE UA: POSITIVE — AB
Protein, UA: NEGATIVE
RBC UA: NEGATIVE
SPEC GRAV UA: 1.025 (ref 1.005–1.030)
UUROB: 0.2 mg/dL (ref 0.2–1.0)
pH, UA: 7 (ref 5.0–7.5)

## 2016-12-04 ENCOUNTER — Other Ambulatory Visit: Payer: Self-pay | Admitting: Internal Medicine

## 2017-09-25 ENCOUNTER — Encounter: Payer: Self-pay | Admitting: Internal Medicine

## 2017-09-25 ENCOUNTER — Ambulatory Visit (INDEPENDENT_AMBULATORY_CARE_PROVIDER_SITE_OTHER): Payer: PRIVATE HEALTH INSURANCE | Admitting: Internal Medicine

## 2017-09-25 VITALS — BP 128/82 | HR 84 | Temp 98.7°F | Ht 62.5 in | Wt 166.0 lb

## 2017-09-25 DIAGNOSIS — Z0000 Encounter for general adult medical examination without abnormal findings: Secondary | ICD-10-CM | POA: Diagnosis not present

## 2017-09-25 DIAGNOSIS — I10 Essential (primary) hypertension: Secondary | ICD-10-CM

## 2017-09-25 DIAGNOSIS — E7800 Pure hypercholesterolemia, unspecified: Secondary | ICD-10-CM | POA: Diagnosis not present

## 2017-09-25 LAB — CBC
HEMATOCRIT: 42.2 % (ref 36.0–46.0)
HEMOGLOBIN: 14.7 g/dL (ref 12.0–15.0)
MCHC: 34.7 g/dL (ref 30.0–36.0)
MCV: 93.1 fl (ref 78.0–100.0)
Platelets: 285 10*3/uL (ref 150.0–400.0)
RBC: 4.54 Mil/uL (ref 3.87–5.11)
RDW: 13.1 % (ref 11.5–15.5)
WBC: 7.8 10*3/uL (ref 4.0–10.5)

## 2017-09-25 LAB — LIPID PANEL
Cholesterol: 204 mg/dL — ABNORMAL HIGH (ref 0–200)
HDL: 46.6 mg/dL (ref >39.00)
NonHDL: 157.57
Total CHOL/HDL Ratio: 4
Triglycerides: 316 mg/dL — ABNORMAL HIGH (ref 0.0–149.0)
VLDL: 63.2 mg/dL — AB (ref 0.0–40.0)

## 2017-09-25 LAB — LDL CHOLESTEROL, DIRECT: LDL DIRECT: 102 mg/dL

## 2017-09-25 LAB — COMPREHENSIVE METABOLIC PANEL
ALT: 21 U/L (ref 0–35)
AST: 20 U/L (ref 0–37)
Albumin: 4.7 g/dL (ref 3.5–5.2)
Alkaline Phosphatase: 95 U/L (ref 39–117)
BUN: 14 mg/dL (ref 6–23)
CHLORIDE: 103 meq/L (ref 96–112)
CO2: 27 meq/L (ref 19–32)
CREATININE: 0.71 mg/dL (ref 0.40–1.20)
Calcium: 10.3 mg/dL (ref 8.4–10.5)
GFR: 89.63 mL/min (ref >60.00)
Glucose, Bld: 101 mg/dL — ABNORMAL HIGH (ref 70–99)
Potassium: 4.4 mEq/L (ref 3.5–5.1)
SODIUM: 139 meq/L (ref 135–145)
Total Bilirubin: 0.5 mg/dL (ref 0.2–1.2)
Total Protein: 7.4 g/dL (ref 6.0–8.3)

## 2017-09-25 MED ORDER — TRIAMCINOLONE ACETONIDE 0.1 % EX CREA: TOPICAL_CREAM | Freq: Two times a day (BID) | CUTANEOUS | 2 refills | Status: DC | PRN

## 2017-09-25 NOTE — Assessment & Plan Note (Signed)
BP Readings from Last 3 Encounters:  09/25/17 128/82  11/27/16 137/81  09/23/16 138/84   Good control Due for labs

## 2017-09-25 NOTE — Assessment & Plan Note (Signed)
No problems with statin 

## 2017-09-25 NOTE — Assessment & Plan Note (Signed)
Healthy but needs to work on fitness Discussed exercise Yearly flu vaccine Every 2 year mammogram Pap due soon---gyn will call Colon 2021---or consider FIT starting next year

## 2017-09-25 NOTE — Progress Notes (Signed)
Subjective:    Patient ID: [name], female    DOB: [birth year], 57 y.o.   MRN: [id number]  HPI Here for physical Keeps up with gyn regularly Pap gets done every 3 years  Still smokes e-cigs Rare regular cigarette She is thinking about quitting--discussed (has to work on the mind set)  Patches/lozenges  Discussed concerns about the ASA Consider at least cutting down to every other day  No recent UTIs Over 1.5 years since the last Cranberry seems to help  Current Outpatient Medications on File Prior to Visit  Medication Sig Dispense Refill  . Ascorbic Acid (VITAMIN C PO) Take 500 Units by mouth daily.     Marland Kitchen. aspirin EC 81 MG tablet Take 81 mg by mouth daily.    Marland Kitchen. atorvastatin (LIPITOR) 10 MG tablet TAKE 1 TABLET BY MOUTH EVERY DAY 90 tablet 3  . B Complex Vitamins (VITAMIN B COMPLEX PO) Take 1 tablet by mouth daily.    Marland Kitchen. CALCIUM-VITAMIN D PO Take 1 tablet by mouth daily.    Marland Kitchen. CRANBERRY EXTRACT PO Take 1-2 tablets by mouth daily.    Marland Kitchen. MAGNESIUM PO Take 400 mg by mouth daily.     . metoprolol succinate (TOPROL-XL) 50 MG 24 hr tablet TAKE 1 TABLET (50 MG TOTAL) BY MOUTH DAILY. TAKE WITH OR IMMEDIATELY FOLLOWING A MEAL. 90 tablet 3  . Multiple Vitamin (MULTIVITAMIN WITH MINERALS) TABS tablet Take 1 tablet by mouth daily.    . Omega-3 Fatty Acids (FISH OIL PO) Take 1,000 mg by mouth daily.     Marland Kitchen. triamcinolone cream (KENALOG) 0.1 %     . VITAMIN E PO Take 1 capsule by mouth daily.     No current facility-administered medications on file prior to visit.     Allergies  Allergen Reactions  . Cephalexin Hives and Rash    Past Medical History:  Diagnosis Date  . Allergic rhinitis due to pollen   . Epistaxis, recurrent   . Hypercholesteremia   . Hypertension   . Recurrent UTI   . Tachycardia    likely brief SVT    Past Surgical History:  Procedure Laterality Date  . LAPAROSCOPIC APPENDECTOMY N/A 06/30/2013   Procedure: APPENDECTOMY LAPAROSCOPIC;  Surgeon: Shelly Rubensteinouglas A  Blackman, MD;  Location: [facility] OR;  Service: General;  Laterality: N/A;    Family History  Problem Relation Age of Onset  . Heart disease Mother   . Diabetes Mother   . Arthritis Father   . Hyperlipidemia Sister   . Obesity Brother   . Cancer Maternal Grandmother        melanoma  . Heart disease Paternal Grandfather   . Kidney disease Neg Hx   . Kidney cancer Neg Hx   . Bladder Cancer Neg Hx     Social History   Socioeconomic History  . Marital status: Widowed    Spouse name: Not on file  . Number of children: 2  . Years of education: Not on file  . Highest education level: Not on file  Occupational History  . Occupation: housewife  Social Needs  . Financial resource strain: Not on file  . Food insecurity:    Worry: Not on file    Inability: Not on file  . Transportation needs:    Medical: Not on file    Non-medical: Not on file  Tobacco Use  . Smoking status: Current Every Day Smoker    Types: E-cigarettes, Cigarettes  . Smokeless tobacco: Never Used  Substance  and Sexual Activity  . Alcohol use: Yes    Alcohol/week: 0.0 oz    Comment: rare alcohol  . Drug use: No  . Sexual activity: Not on file  Lifestyle  . Physical activity:    Days per week: Not on file    Minutes per session: Not on file  . Stress: Not on file  Relationships  . Social connections:    Talks on phone: Not on file    Gets together: Not on file    Attends religious service: Not on file    Active member of club or organization: Not on file    Attends meetings of clubs or organizations: Not on file    Relationship status: Not on file  . Intimate partner violence:    Fear of current or ex partner: Not on file    Emotionally abused: Not on file    Physically abused: Not on file    Forced sexual activity: Not on file  Other Topics Concern  . Not on file  Social History Narrative   2 daughters   Review of Systems  Constitutional: Negative for fatigue and unexpected weight change.        Tries to do some exercise Wears seat belt  HENT: Negative for dental problem, hearing loss, tinnitus and trouble swallowing.        Keeps up with dentist  Eyes: Negative for visual disturbance.       No diplopia or unilateral vision loss  Respiratory: Positive for cough. Negative for chest tightness and shortness of breath.        Regular cough---occasionally productive  Cardiovascular: Negative for chest pain, palpitations and leg swelling.  Gastrointestinal: Negative for abdominal pain, blood in stool and constipation.       No heartburn  Endocrine: Negative for polydipsia and polyuria.  Genitourinary: Negative for dysuria and hematuria.       Discussed safe sex (abstinent now)  Musculoskeletal: Negative for arthralgias and joint swelling.       Back pain at times---like after doing the yard  Skin:       Gets rash on legs out in sun ---despite sun block  Allergic/Immunologic: Positive for environmental allergies. Negative for immunocompromised state.       Uses OTC meds in season  Neurological: Negative for dizziness, syncope, light-headedness and headaches.  Hematological: Negative for adenopathy. Does not bruise/bleed easily.  Psychiatric/Behavioral: Negative for dysphoric mood and sleep disturbance. The patient is not nervous/anxious.        Objective:   Physical Exam  Constitutional: She is oriented to person, place, and time. She appears well-developed. No distress.  HENT:  Head: Normocephalic and atraumatic.  Right Ear: External ear normal.  Left Ear: External ear normal.  Mouth/Throat: Oropharynx is clear and moist. No oropharyngeal exudate.  Eyes: Pupils are equal, round, and reactive to light. Conjunctivae are normal.  Neck: No thyromegaly present.  Cardiovascular: Normal rate, regular rhythm, normal heart sounds and intact distal pulses. Exam reveals no gallop.  No murmur heard. Respiratory: Effort normal and breath sounds normal. No respiratory distress. She has no  wheezes. She has no rales.  GI: Soft. There is no tenderness.  Musculoskeletal: She exhibits no edema or tenderness.  Lymphadenopathy:    She has no cervical adenopathy.  Neurological: She is alert and oriented to person, place, and time.  Skin: No rash noted. No erythema.  Psychiatric: She has a normal mood and affect. Her behavior is normal.  Assessment & Plan:

## 2017-10-26 ENCOUNTER — Other Ambulatory Visit: Payer: Self-pay | Admitting: Internal Medicine

## 2017-11-11 LAB — HM PAP SMEAR

## 2017-11-13 ENCOUNTER — Encounter: Payer: Self-pay | Admitting: Internal Medicine

## 2017-11-25 ENCOUNTER — Encounter: Payer: Self-pay | Admitting: Urology

## 2017-11-25 ENCOUNTER — Ambulatory Visit (INDEPENDENT_AMBULATORY_CARE_PROVIDER_SITE_OTHER): Payer: PRIVATE HEALTH INSURANCE | Admitting: Urology

## 2017-11-25 VITALS — BP 148/78 | HR 93 | Ht 62.5 in | Wt 165.9 lb

## 2017-11-25 DIAGNOSIS — N3000 Acute cystitis without hematuria: Secondary | ICD-10-CM | POA: Diagnosis not present

## 2017-11-25 DIAGNOSIS — N8111 Cystocele, midline: Secondary | ICD-10-CM

## 2017-11-25 DIAGNOSIS — Z87448 Personal history of other diseases of urinary system: Secondary | ICD-10-CM

## 2017-11-25 LAB — URINALYSIS, COMPLETE
Bilirubin, UA: NEGATIVE
GLUCOSE, UA: NEGATIVE
Ketones, UA: NEGATIVE
Leukocytes, UA: NEGATIVE
NITRITE UA: POSITIVE — AB
PH UA: 7 (ref 5.0–7.5)
PROTEIN UA: NEGATIVE
RBC UA: NEGATIVE
Specific Gravity, UA: 1.02 (ref 1.005–1.030)
UUROB: 0.2 mg/dL (ref 0.2–1.0)

## 2017-11-25 LAB — MICROSCOPIC EXAMINATION: RBC, UA: NONE SEEN /hpf (ref 0–2)

## 2017-11-25 LAB — BLADDER SCAN AMB NON-IMAGING: SCAN RESULT: 15

## 2017-11-25 NOTE — Progress Notes (Signed)
11/25/2017 11:08 AM   [name] Aug 13, 1960 [id number]  Referring provider: Karie Schwalbe, MD [address] [place], [place] [address]  Chief Complaint  Patient presents with  . Hematuria    HPI: Patient is a 57 year old Caucasian with a history of hematuria who presents today for a 1 year follow-up.  She underwent a CT of the abdomen and pelvis with contrast on 07/04/2013.  She was found to have a ruptured appendix at that time.  She did have small bilateral extrarenal pelves. A benign 1 cm Bosniak type I cyst in the left kidney. And a 4 mm nonenhancing peripheral nodule in the left kidney.  She is a smoker with a 30 ppd history.  Patient completed a hematuria work up with CT Urogram and cystoscopy.  CT Urogram performed on 08/07/2015 noted no findings identified to explain patient's hematuria.  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  Aortic atherosclerosis.  Cystocele.  Lumbar spondylosis.  Cystoscopy with Dr. Ronne Binning performed on 08/14/2015 was normal.    Over the past few weeks she has noted of feeling of incomplete bladder emptying and lower back pain.   Patient denies any gross hematuria, dysuria or suprapubic/flank pain.  Patient denies any fevers, chills, nausea or vomiting.   She is still having baseline nocturia and intermittency.  Her UA is nitrite positive with many bacteria.  Her PVR is  15 mL    PMH: Past Medical History:  Diagnosis Date  . Allergic rhinitis due to pollen   . Epistaxis, recurrent   . Hypercholesteremia   . Hypertension   . Recurrent UTI   . Tachycardia    likely brief SVT    Surgical History: Past Surgical History:  Procedure Laterality Date  . LAPAROSCOPIC APPENDECTOMY N/A 06/30/2013   Procedure: APPENDECTOMY LAPAROSCOPIC;  Surgeon: Shelly Rubenstein, MD;  Location: [facility] OR;  Service: General;  Laterality: N/A;    Home Medications:  Allergies as of 11/25/2017      Reactions   Cephalexin Hives, Rash        Medication List        Accurate as of 11/25/17 11:08 AM. Always use your most recent med list.          aspirin EC 81 MG tablet Take 81 mg by mouth daily.   atorvastatin 10 MG tablet Commonly known as:  LIPITOR TAKE 1 TABLET BY MOUTH EVERY DAY   CALCIUM-VITAMIN D PO Take 1 tablet by mouth daily.   CRANBERRY EXTRACT PO Take 1-2 tablets by mouth daily.   MAGNESIUM PO Take 400 mg by mouth daily.   metoprolol succinate 50 MG 24 hr tablet Commonly known as:  TOPROL-XL TAKE 1 TABLET (50 MG TOTAL) BY MOUTH DAILY. TAKE WITH OR IMMEDIATELY FOLLOWING A MEAL.   multivitamin with minerals Tabs tablet Take 1 tablet by mouth daily.   triamcinolone cream 0.1 % Commonly known as:  KENALOG Apply topically 2 (two) times daily as needed.   VITAMIN B COMPLEX PO Take 1 tablet by mouth daily.   VITAMIN C PO Take 500 Units by mouth daily.       Allergies:  Allergies  Allergen Reactions  . Cephalexin Hives and Rash    Family History: Family History  Problem Relation Age of Onset  . Heart disease Mother   . Diabetes Mother   . Arthritis Father   . Hyperlipidemia Sister   . Obesity Brother   . Cancer Maternal Grandmother  melanoma  . Heart disease Paternal Grandfather   . Kidney disease Neg Hx   . Kidney cancer Neg Hx   . Bladder Cancer Neg Hx     Social History:  reports that she has been smoking e-cigarettes and cigarettes. She has never used smokeless tobacco. She reports that she drinks alcohol. She reports that she does not use drugs.  ROS: UROLOGY Frequent Urination?: No Hard to postpone urination?: No Burning/pain with urination?: No Get up at night to urinate?: Yes Leakage of urine?: No Urine stream starts and stops?: Yes Trouble starting stream?: No Do you have to strain to urinate?: No Blood in urine?: No Urinary tract infection?: No Sexually transmitted disease?: No Injury to kidneys or bladder?: No Painful intercourse?: No Weak stream?:  No Currently pregnant?: No Vaginal bleeding?: No Last menstrual period?: n  Gastrointestinal Nausea?: No Vomiting?: No Indigestion/heartburn?: No Diarrhea?: No Constipation?: Yes  Constitutional Fever: No Night sweats?: No Weight loss?: No Fatigue?: No  Skin Skin rash/lesions?: No Itching?: No  Eyes Blurred vision?: No Double vision?: No  Ears/Nose/Throat Sore throat?: No Sinus problems?: No  Hematologic/Lymphatic Swollen glands?: No Easy bruising?: No  Cardiovascular Leg swelling?: No Chest pain?: No  Respiratory Cough?: No Shortness of breath?: No  Endocrine Excessive thirst?: No  Musculoskeletal Back pain?: Yes Joint pain?: No  Neurological Headaches?: No Dizziness?: No  Psychologic Depression?: No Anxiety?: No  Physical Exam: BP (!) 148/78 (BP Location: Left Arm, Patient Position: Sitting, Cuff Size: Normal)   Pulse 93   Ht 5' 2.5" (1.588 m)   Wt 165 lb 14.4 oz (75.3 kg)   BMI 29.86 kg/m   Constitutional: Well nourished. Alert and oriented, No acute distress. HEENT: [place] AT, moist mucus membranes. Trachea midline, no masses. Cardiovascular: No clubbing, cyanosis, or edema. Respiratory: Normal respiratory effort, no increased work of breathing. Skin: No rashes, bruises or suspicious lesions. Lymph: No cervical or inguinal adenopathy. Neurologic: Grossly intact, no focal deficits, moving all 4 extremities. Psychiatric: Normal mood and affect.  Laboratory Data: Lab Results  Component Value Date   WBC 7.8 09/25/2017   HGB 14.7 09/25/2017   HCT 42.2 09/25/2017   MCV 93.1 09/25/2017   PLT 285.0 09/25/2017    Lab Results  Component Value Date   CREATININE 0.71 09/25/2017      Component Value Date/Time   CHOL 204 (H) 09/25/2017 1023   HDL 46.60 09/25/2017 1023   CHOLHDL 4 09/25/2017 1023   VLDL 63.2 (H) 09/25/2017 1023   LDLCALC 84 09/05/2014 1304    Lab Results  Component Value Date   AST 20 09/25/2017   Lab Results   Component Value Date   ALT 21 09/25/2017   Pertinent imaging Results for orders placed or performed in visit on 11/25/17  Bladder Scan (Post Void Residual) in office  Result Value Ref Range   Scan Result 15    Urinalysis Nitrite positive and many bacteria.  See Epic.    I have reviewed the labs   Assessment & Plan:    1. History of hematuria:    completed hematuria work up with CT Urogram and cystoscopy in 2017- no GU malignancies were found patient to report any gross hematuria UA is negative for hematuria RTC in one year for UA   2. UTI UA is nitrite positive with many bacteria Will send for culture to check for indolent infection Will not prescribe an antibiotic until urine culture has finalized  3. Cystocele Not bothersome to the patient at this  time  Return in about 1 year (around 11/26/2018) for recheck UA and office visit .  These notes generated with voice recognition software. I apologize for typographical errors.  Michiel Cowboy, PA-C  [facility] [address] [place], [place] [address] [phone]

## 2017-11-28 LAB — CULTURE, URINE COMPREHENSIVE

## 2017-11-30 ENCOUNTER — Telehealth: Payer: Self-pay

## 2017-11-30 MED ORDER — SULFAMETHOXAZOLE-TRIMETHOPRIM 800-160 MG PO TABS: 1.0000 | ORAL_TABLET | Freq: Two times a day (BID) | ORAL | 0 refills | Status: DC

## 2017-11-30 NOTE — Telephone Encounter (Signed)
-----   Message from Harle BattiestShannon A McGowan, PA-C sent at 11/29/2017  9:04 PM EDT ----- Please let Darl PikesSusan know that her urine culture was positive.  We will need to send in a script for Augmentin 875/125, twice daily for seven days.

## 2017-11-30 NOTE — Telephone Encounter (Signed)
Patient notified , allergy to Cephalexin which is a contraindication. A script sent for Bactrim DS to patient's pharm.

## 2017-12-24 ENCOUNTER — Ambulatory Visit (INDEPENDENT_AMBULATORY_CARE_PROVIDER_SITE_OTHER): Payer: PRIVATE HEALTH INSURANCE

## 2017-12-24 DIAGNOSIS — Z23 Encounter for immunization: Secondary | ICD-10-CM | POA: Diagnosis not present

## 2017-12-28 DIAGNOSIS — K2950 Unspecified chronic gastritis without bleeding: Secondary | ICD-10-CM | POA: Diagnosis not present

## 2017-12-28 DIAGNOSIS — K210 Gastro-esophageal reflux disease with esophagitis: Secondary | ICD-10-CM | POA: Diagnosis not present

## 2018-02-17 ENCOUNTER — Telehealth: Payer: Self-pay | Admitting: Urology

## 2018-02-17 ENCOUNTER — Other Ambulatory Visit: Payer: Self-pay

## 2018-02-17 DIAGNOSIS — Z87448 Personal history of other diseases of urinary system: Secondary | ICD-10-CM

## 2018-02-17 NOTE — Telephone Encounter (Signed)
Patient called the office today with symptoms of increased urgency, frequency, and foul smelling urine.  She is requesting that we check her urine for a UTI.   I added her to the lab schedule for 02/18/18 and advised her to come to the office, fill out a symptom checklist and provide a urine sample for the lab.  Patient can be reached at [phone].

## 2018-02-18 ENCOUNTER — Telehealth: Payer: Self-pay | Admitting: Urology

## 2018-02-18 ENCOUNTER — Other Ambulatory Visit: Payer: PRIVATE HEALTH INSURANCE

## 2018-02-18 DIAGNOSIS — Z87448 Personal history of other diseases of urinary system: Secondary | ICD-10-CM

## 2018-02-18 LAB — URINALYSIS, COMPLETE
Bilirubin, UA: NEGATIVE
GLUCOSE, UA: NEGATIVE
KETONES UA: NEGATIVE
Leukocytes, UA: NEGATIVE
NITRITE UA: POSITIVE — AB
RBC UA: NEGATIVE
SPEC GRAV UA: 1.025 (ref 1.005–1.030)
UUROB: 0.2 mg/dL (ref 0.2–1.0)
pH, UA: 7 (ref 5.0–7.5)

## 2018-02-18 LAB — MICROSCOPIC EXAMINATION
Epithelial Cells (non renal): NONE SEEN /hpf (ref 0–10)
RBC MICROSCOPIC, UA: NONE SEEN /HPF (ref 0–2)

## 2018-02-18 NOTE — Telephone Encounter (Signed)
Please let Mrs. [name] know that we are sending her urine for culture and I do not want to prescribe an antibiotic at this time as she is just finished an antibiotic recently and I would like to wait on the culture results and sensitivities if it is positive.  In the interim she can take AZO over-the-counter for her symptoms.

## 2018-02-19 NOTE — Telephone Encounter (Signed)
Patient notified and voiced understanding.

## 2018-02-22 LAB — CULTURE, URINE COMPREHENSIVE

## 2018-03-01 MED ORDER — SULFAMETHOXAZOLE-TRIMETHOPRIM 800-160 MG PO TABS: 1.0000 | ORAL_TABLET | Freq: Two times a day (BID) | ORAL | 0 refills | Status: DC

## 2018-03-01 NOTE — Telephone Encounter (Signed)
Patient called office asking about her urine culture results from 12/5.  Please call pt.

## 2018-03-01 NOTE — Telephone Encounter (Signed)
Patient informed of urine culture results.  Advised her to take bactrim.  Rx sent to [facility].

## 2018-10-05 ENCOUNTER — Ambulatory Visit (INDEPENDENT_AMBULATORY_CARE_PROVIDER_SITE_OTHER): Payer: PRIVATE HEALTH INSURANCE | Admitting: Internal Medicine

## 2018-10-05 ENCOUNTER — Other Ambulatory Visit: Payer: Self-pay

## 2018-10-05 ENCOUNTER — Encounter: Payer: Self-pay | Admitting: Internal Medicine

## 2018-10-05 VITALS — BP 122/86 | HR 80 | Temp 98.4°F | Ht 63.0 in | Wt 163.0 lb

## 2018-10-05 DIAGNOSIS — I10 Essential (primary) hypertension: Secondary | ICD-10-CM

## 2018-10-05 DIAGNOSIS — N390 Urinary tract infection, site not specified: Secondary | ICD-10-CM | POA: Diagnosis not present

## 2018-10-05 DIAGNOSIS — E7800 Pure hypercholesterolemia, unspecified: Secondary | ICD-10-CM

## 2018-10-05 DIAGNOSIS — Z0000 Encounter for general adult medical examination without abnormal findings: Secondary | ICD-10-CM

## 2018-10-05 LAB — COMPREHENSIVE METABOLIC PANEL
ALT: 19 U/L (ref 0–35)
AST: 18 U/L (ref 0–37)
Albumin: 4.8 g/dL (ref 3.5–5.2)
Alkaline Phosphatase: 105 U/L (ref 39–117)
BUN: 11 mg/dL (ref 6–23)
CO2: 26 mEq/L (ref 19–32)
Calcium: 9.8 mg/dL (ref 8.4–10.5)
Chloride: 103 mEq/L (ref 96–112)
Creatinine, Ser: 0.71 mg/dL (ref 0.40–1.20)
GFR: 84.04 mL/min (ref >60.00)
Glucose, Bld: 94 mg/dL (ref 70–99)
Potassium: 3.9 mEq/L (ref 3.5–5.1)
Sodium: 138 mEq/L (ref 135–145)
Total Bilirubin: 0.6 mg/dL (ref 0.2–1.2)
Total Protein: 7.5 g/dL (ref 6.0–8.3)

## 2018-10-05 LAB — LIPID PANEL
Cholesterol: 228 mg/dL — ABNORMAL HIGH (ref 0–200)
HDL: 45.1 mg/dL (ref >39.00)
Total CHOL/HDL Ratio: 5
Triglycerides: 474 mg/dL — ABNORMAL HIGH (ref 0.0–149.0)

## 2018-10-05 LAB — CBC
HCT: 43.3 % (ref 36.0–46.0)
Hemoglobin: 15.1 g/dL — ABNORMAL HIGH (ref 12.0–15.0)
MCHC: 34.8 g/dL (ref 30.0–36.0)
MCV: 93.8 fl (ref 78.0–100.0)
Platelets: 283 10*3/uL (ref 150.0–400.0)
RBC: 4.62 Mil/uL (ref 3.87–5.11)
RDW: 12.8 % (ref 11.5–15.5)
WBC: 9.4 10*3/uL (ref 4.0–10.5)

## 2018-10-05 LAB — LDL CHOLESTEROL, DIRECT: Direct LDL: 97 mg/dL

## 2018-10-05 NOTE — Progress Notes (Signed)
Subjective:    Patient ID: [name] GuardianSusan K Rish, female    DOB: 12/24/1960, 58 y.o.   MRN: [id number]  HPI Here for physical  Doing okay No new concerns No recent UTIs---not in last 6 months Does take 2 cranberry tabs daily Not related to coitus  Is at home Daughters around  Keeps up with gyn Gets the mammogram yearly there--and pap was done last year  Still with e-cigs Rare regular cigarette Discussed again  Current Outpatient Medications on File Prior to Visit  Medication Sig Dispense Refill  . Ascorbic Acid (VITAMIN C PO) Take 500 Units by mouth daily.     Marland Kitchen. aspirin EC 81 MG tablet Take 81 mg by mouth daily.    Marland Kitchen. atorvastatin (LIPITOR) 10 MG tablet TAKE 1 TABLET BY MOUTH EVERY DAY 90 tablet 3  . B Complex Vitamins (VITAMIN B COMPLEX PO) Take 1 tablet by mouth daily.    Marland Kitchen. CALCIUM-VITAMIN D PO Take 1 tablet by mouth daily.    Marland Kitchen. CRANBERRY EXTRACT PO Take 1-2 tablets by mouth daily.    Marland Kitchen. MAGNESIUM PO Take 400 mg by mouth daily.     . metoprolol succinate (TOPROL-XL) 50 MG 24 hr tablet TAKE 1 TABLET (50 MG TOTAL) BY MOUTH DAILY. TAKE WITH OR IMMEDIATELY FOLLOWING A MEAL. 90 tablet 3  . Multiple Vitamin (MULTIVITAMIN WITH MINERALS) TABS tablet Take 1 tablet by mouth daily.    Marland Kitchen. triamcinolone cream (KENALOG) 0.1 % Apply topically 2 (two) times daily as needed. 45 g 2   No current facility-administered medications on file prior to visit.     Allergies  Allergen Reactions  . Cephalexin Hives and Rash    Past Medical History:  Diagnosis Date  . Allergic rhinitis due to pollen   . Epistaxis, recurrent   . Hypercholesteremia   . Hypertension   . Recurrent UTI   . Tachycardia    likely brief SVT    Past Surgical History:  Procedure Laterality Date  . LAPAROSCOPIC APPENDECTOMY N/A 06/30/2013   Procedure: APPENDECTOMY LAPAROSCOPIC;  Surgeon: Shelly Rubensteinouglas A Blackman, MD;  Location: [facility] OR;  Service: General;  Laterality: N/A;    Family History  Problem Relation Age of Onset   . Heart disease Mother   . Diabetes Mother   . Arthritis Father   . Hyperlipidemia Sister   . Obesity Brother   . Cancer Maternal Grandmother        melanoma  . Heart disease Paternal Grandfather   . Kidney disease Neg Hx   . Kidney cancer Neg Hx   . Bladder Cancer Neg Hx     Social History   Socioeconomic History  . Marital status: Widowed    Spouse name: Not on file  . Number of children: 2  . Years of education: Not on file  . Highest education level: Not on file  Occupational History  . Occupation: housewife  Social Needs  . Financial resource strain: Not on file  . Food insecurity    Worry: Not on file    Inability: Not on file  . Transportation needs    Medical: Not on file    Non-medical: Not on file  Tobacco Use  . Smoking status: Current Every Day Smoker    Types: E-cigarettes, Cigarettes  . Smokeless tobacco: Never Used  Substance and Sexual Activity  . Alcohol use: Yes    Alcohol/week: 0.0 standard drinks    Comment: rare alcohol  . Drug use: No  . Sexual activity:  Not on file  Lifestyle  . Physical activity    Days per week: Not on file    Minutes per session: Not on file  . Stress: Not on file  Relationships  . Social Herbalist on phone: Not on file    Gets together: Not on file    Attends religious service: Not on file    Active member of club or organization: Not on file    Attends meetings of clubs or organizations: Not on file    Relationship status: Not on file  . Intimate partner violence    Fear of current or ex partner: Not on file    Emotionally abused: Not on file    Physically abused: Not on file    Forced sexual activity: Not on file  Other Topics Concern  . Not on file  Social History Narrative   2 daughters   Review of Systems  Constitutional: Negative for fatigue and unexpected weight change.       Wears seat beat Tries to walk some  HENT: Negative for hearing loss, tinnitus and trouble swallowing.         Keeps up with dentist  Eyes: Negative for visual disturbance.       No diplopia or unilateral vision loss  Respiratory: Negative for cough, chest tightness, shortness of breath and wheezing.   Cardiovascular: Negative for chest pain, palpitations and leg swelling.  Gastrointestinal: Negative for abdominal pain, blood in stool and constipation.       No hearttburn  Endocrine: Negative for polydipsia and polyuria.  Genitourinary: Negative for difficulty urinating, dysuria and hematuria.  Musculoskeletal: Negative for arthralgias, back pain and joint swelling.  Skin: Negative for rash.       Same spots on legs after sun exposure  Allergic/Immunologic: Positive for environmental allergies. Negative for immunocompromised state.       Uses OTC meds prn  Neurological: Negative for dizziness, syncope, light-headedness and headaches.  Hematological: Negative for adenopathy. Does not bruise/bleed easily.  Psychiatric/Behavioral: Negative for dysphoric mood and sleep disturbance. The patient is not nervous/anxious.        Objective:   Physical Exam  Constitutional: She is oriented to person, place, and time. She appears well-developed. No distress.  HENT:  Head: Normocephalic and atraumatic.  Right Ear: External ear normal.  Left Ear: External ear normal.  Mouth/Throat: Oropharynx is clear and moist. No oropharyngeal exudate.  Eyes: Pupils are equal, round, and reactive to light. Conjunctivae are normal.  Neck: No thyromegaly present.  Cardiovascular: Normal rate, regular rhythm, normal heart sounds and intact distal pulses. Exam reveals no gallop.  No murmur heard. Respiratory: Effort normal and breath sounds normal. No respiratory distress. She has no wheezes. She has no rales.  GI: Soft. There is no abdominal tenderness.  Musculoskeletal:        General: No tenderness or edema.  Lymphadenopathy:    She has no cervical adenopathy.  Neurological: She is alert and oriented to person,  place, and time.  Skin: No rash noted. No erythema.  Psychiatric: She has a normal mood and affect. Her behavior is normal.           Assessment & Plan:

## 2018-10-05 NOTE — Assessment & Plan Note (Signed)
No recent problems On cranberry

## 2018-10-05 NOTE — Assessment & Plan Note (Signed)
Healthy Work on fitness Keeps up with mammograms and paps with gyn Colon due next year Flu vaccine in the fall

## 2018-10-05 NOTE — Assessment & Plan Note (Signed)
No problems with primary prevention with statin 

## 2018-10-05 NOTE — Assessment & Plan Note (Signed)
BP Readings from Last 3 Encounters:  10/05/18 122/86  11/25/17 (!) 148/78  09/25/17 128/82   Good control No changes needed

## 2018-11-06 ENCOUNTER — Other Ambulatory Visit: Payer: Self-pay | Admitting: Internal Medicine

## 2018-11-29 NOTE — Progress Notes (Signed)
11/30/2018 10:39 AM   [name] 10-02-1960 [id number]  Referring provider: Venia Carbon, MD [address] [place],  [place] [address]  Chief Complaint  Patient presents with  . Hematuria    HPI: Patient is a 58 year old female with a history of hematuria who presents today for a 1 year follow-up.  History of hematuria (high risk > 30 pack year history of smoking) Smoker.  She underwent a CT of the abdomen and pelvis with contrast on 07/04/2013.  She was found to have a ruptured appendix at that time.  She did have small bilateral extrarenal pelves. A benign 1 cm Bosniak type I cyst in the left kidney. And a 4 mm nonenhancing peripheral nodule in the left kidney.  She is a smoker with a 30 ppd history.  Patient completed a hematuria work up with CT Urogram and cystoscopy.  CT Urogram performed on 08/07/2015 noted no findings identified to explain patient's hematuria.  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  Aortic atherosclerosis.  Cystocele.  Lumbar spondylosis.  Cystoscopy with Dr. Alyson Ingles performed on 08/14/2015 was normal.   She has not reported any gross hematuria.  Today's UA was negative for AMH.    She has a history of rUTI's.  She hasn't had an UTI since December.  When she does get an infection, she has malodorous urine and LBP with the UTI.  Patient denies any gross hematuria, dysuria or suprapubic/flank pain.  Patient denies any fevers, chills, nausea or vomiting.  She is asymptomatic at this visit.  Her UA was nitrite positive with many bacteria.    PMH: Past Medical History:  Diagnosis Date  . Allergic rhinitis due to pollen   . Epistaxis, recurrent   . Hypercholesteremia   . Hypertension   . Recurrent UTI   . Tachycardia    likely brief SVT    Surgical History: Past Surgical History:  Procedure Laterality Date  . LAPAROSCOPIC APPENDECTOMY N/A 06/30/2013   Procedure: APPENDECTOMY LAPAROSCOPIC;  Surgeon: Harl Bowie, MD;   Location: [place];  Service: General;  Laterality: N/A;    Home Medications:  Allergies as of 11/30/2018      Reactions   Cephalexin Hives, Rash      Medication List       Accurate as of November 30, 2018 10:39 AM. If you have any questions, ask your nurse or doctor.        aspirin EC 81 MG tablet Take 81 mg by mouth daily.   atorvastatin 10 MG tablet Commonly known as: LIPITOR TAKE 1 TABLET BY MOUTH EVERY DAY   CALCIUM-VITAMIN D PO Take 1 tablet by mouth daily.   CRANBERRY EXTRACT PO Take 1-2 tablets by mouth daily.   MAGNESIUM PO Take 400 mg by mouth daily.   metoprolol succinate 50 MG 24 hr tablet Commonly known as: TOPROL-XL TAKE 1 TABLET (50 MG TOTAL) BY MOUTH DAILY. TAKE WITH OR IMMEDIATELY FOLLOWING A MEAL.   multivitamin with minerals Tabs tablet Take 1 tablet by mouth daily.   triamcinolone cream 0.1 % Commonly known as: KENALOG Apply topically 2 (two) times daily as needed.   VITAMIN B COMPLEX PO Take 1 tablet by mouth daily.   VITAMIN C PO Take 500 Units by mouth daily.       Allergies:  Allergies  Allergen Reactions  . Cephalexin Hives and Rash    Family History: Family History  Problem Relation Age of Onset  . Heart disease Mother   .  Diabetes Mother   . Arthritis Father   . Hyperlipidemia Sister   . Obesity Brother   . Cancer Maternal Grandmother        melanoma  . Heart disease Paternal Grandfather   . Kidney disease Neg Hx   . Kidney cancer Neg Hx   . Bladder Cancer Neg Hx     Social History:  reports that she has been smoking e-cigarettes and cigarettes. She has never used smokeless tobacco. She reports current alcohol use. She reports that she does not use drugs.  ROS: UROLOGY Frequent Urination?: No Hard to postpone urination?: No Burning/pain with urination?: No Get up at night to urinate?: No Leakage of urine?: No Urine stream starts and stops?: Yes Trouble starting stream?: No Do you have to strain to urinate?:  No Blood in urine?: No Urinary tract infection?: No Sexually transmitted disease?: No Injury to kidneys or bladder?: No Painful intercourse?: No Weak stream?: No Currently pregnant?: No Vaginal bleeding?: No Last menstrual period?: n  Gastrointestinal Nausea?: No Vomiting?: No Indigestion/heartburn?: No Diarrhea?: No Constipation?: No  Constitutional Fever: No Night sweats?: No Weight loss?: No Fatigue?: No  Skin Skin rash/lesions?: No Itching?: No  Eyes Blurred vision?: No Double vision?: No  Ears/Nose/Throat Sore throat?: No Sinus problems?: No  Hematologic/Lymphatic Swollen glands?: No Easy bruising?: No  Cardiovascular Leg swelling?: No Chest pain?: No  Respiratory Cough?: No Shortness of breath?: No  Endocrine Excessive thirst?: No  Musculoskeletal Back pain?: No Joint pain?: No  Neurological Headaches?: No Dizziness?: No  Psychologic Depression?: No Anxiety?: No  Physical Exam: BP (!) 155/80 (BP Location: Left Arm, Patient Position: Sitting, Cuff Size: Normal)   Pulse 87   Ht 5\' 3"  (1.6 m)   Wt 162 lb 14.4 oz (73.9 kg)   BMI 28.86 kg/m   Constitutional:  Well nourished. Alert and oriented, No acute distress. HEENT: [place] AT, moist mucus membranes.  Trachea midline, no masses. Cardiovascular: No clubbing, cyanosis, or edema. Respiratory: Normal respiratory effort, no increased work of breathing. Neurologic: Grossly intact, no focal deficits, moving all 4 extremities. Psychiatric: Normal mood and affect.   Laboratory Data: Lab Results  Component Value Date   WBC 9.4 10/05/2018   HGB 15.1 (H) 10/05/2018   HCT 43.3 10/05/2018   MCV 93.8 10/05/2018   PLT 283.0 10/05/2018    Lab Results  Component Value Date   CREATININE 0.71 10/05/2018      Component Value Date/Time   CHOL 228 (H) 10/05/2018 0922   HDL 45.10 10/05/2018 0922   CHOLHDL 5 10/05/2018 0922   VLDL 63.2 (H) 09/25/2017 1023   LDLCALC 84 09/05/2014 1304    Lab  Results  Component Value Date   AST 18 10/05/2018   Lab Results  Component Value Date   ALT 19 10/05/2018   Results for orders placed or performed in visit on 10/05/18  Comprehensive metabolic panel  Result Value Ref Range   Sodium 138 135 - 145 mEq/L   Potassium 3.9 3.5 - 5.1 mEq/L   Chloride 103 96 - 112 mEq/L   CO2 26 19 - 32 mEq/L   Glucose, Bld 94 70 - 99 mg/dL   BUN 11 6 - 23 mg/dL   Creatinine, Ser 7.67 0.40 - 1.20 mg/dL   Total Bilirubin 0.6 0.2 - 1.2 mg/dL   Alkaline Phosphatase 105 39 - 117 U/L   AST 18 0 - 37 U/L   ALT 19 0 - 35 U/L   Total Protein 7.5 6.0 -  8.3 g/dL   Albumin 4.8 3.5 - 5.2 g/dL   Calcium 9.8 8.4 - 16.110.5 mg/dL   GFR 09.6084.04 >45.40>60.00 mL/min  Lipid panel  Result Value Ref Range   Cholesterol 228 (H) 0 - 200 mg/dL   Triglycerides (H) 0.0 - 149.0 mg/dL    981.1474.0 Triglyceride is over 400; calculations on Lipids are invalid.   HDL 45.10 >39.00 mg/dL   Total CHOL/HDL Ratio 5   CBC  Result Value Ref Range   WBC 9.4 4.0 - 10.5 K/uL   RBC 4.62 3.87 - 5.11 Mil/uL   Platelets 283.0 150.0 - 400.0 K/uL   Hemoglobin 15.1 (H) 12.0 - 15.0 g/dL   HCT 91.443.3 78.236.0 - 95.646.0 %   MCV 93.8 78.0 - 100.0 fl   MCHC 34.8 30.0 - 36.0 g/dL   RDW 21.312.8 08.611.5 - 57.815.5 %  LDL cholesterol, direct  Result Value Ref Range   Direct LDL 97.0 mg/dL   Urinalysis Component     Latest Ref Rng & Units 11/30/2018  Specific Gravity, UA     1.005 - 1.030 1.020  pH, UA     5.0 - 7.5 7.0  Color, UA     Yellow Yellow  Appearance Ur     Clear Cloudy (A)  Leukocytes,UA     Negative Negative  Protein,UA     Negative/Trace Negative  Glucose, UA     Negative Negative  Ketones, UA     Negative Negative  RBC, UA     Negative Negative  Bilirubin, UA     Negative Negative  Urobilinogen, Ur     0.2 - 1.0 mg/dL 0.2  Nitrite, UA     Negative Positive (A)  Microscopic Examination      See below:   Component     Latest Ref Rng & Units 11/30/2018  WBC, UA     0 - 5 /hpf 0-5  RBC     0 -  2 /hpf None seen  Epithelial Cells (non renal)     0 - 10 /hpf 0-10  Casts     None seen /lpf Present (A)  Cast Type     N/A Hyaline casts  Crystals     N/A Present (A)  Crystal Type     N/A Amorphous Sediment  Bacteria, UA     None seen/Few Many (A)   I have reviewed the labs   Assessment & Plan:    1. History of hematuria:    completed hematuria work up with CT Urogram and cystoscopy in 2017- no GU malignancies were found patient to report any gross hematuria UA is negative for hematuria RTC in one year for UA   2. History of rUTI's UA was nitrite positive with many bacteria, but she is asymptomatic at this visit Will not send for culture, but will give a script of Septra DS to take in the event she develops symptoms in the future She will contact us if she should experience any further UTI's    Return in about 1 year (around 11/30/2019) for UA and office visit .  These notes generated with voice recognition software. I apologize for typographical errors.  Michiel CowboySHANNON Brasen Bundren, PA-C  [facility] [address] [place], [place] [address] [phone]

## 2018-11-30 ENCOUNTER — Ambulatory Visit (INDEPENDENT_AMBULATORY_CARE_PROVIDER_SITE_OTHER): Payer: PRIVATE HEALTH INSURANCE | Admitting: Urology

## 2018-11-30 ENCOUNTER — Other Ambulatory Visit: Payer: Self-pay

## 2018-11-30 ENCOUNTER — Encounter: Payer: Self-pay | Admitting: Urology

## 2018-11-30 VITALS — BP 155/80 | HR 87 | Ht 63.0 in | Wt 162.9 lb

## 2018-11-30 DIAGNOSIS — Z87448 Personal history of other diseases of urinary system: Secondary | ICD-10-CM | POA: Diagnosis not present

## 2018-11-30 DIAGNOSIS — Z87440 Personal history of urinary (tract) infections: Secondary | ICD-10-CM

## 2018-11-30 LAB — MICROSCOPIC EXAMINATION: RBC, Urine: NONE SEEN /hpf (ref 0–2)

## 2018-11-30 LAB — URINALYSIS, COMPLETE
Bilirubin, UA: NEGATIVE
Glucose, UA: NEGATIVE
Ketones, UA: NEGATIVE
Leukocytes,UA: NEGATIVE
Nitrite, UA: POSITIVE — AB
Protein,UA: NEGATIVE
RBC, UA: NEGATIVE
Specific Gravity, UA: 1.02 (ref 1.005–1.030)
Urobilinogen, Ur: 0.2 mg/dL (ref 0.2–1.0)
pH, UA: 7 (ref 5.0–7.5)

## 2018-11-30 MED ORDER — SULFAMETHOXAZOLE-TRIMETHOPRIM 800-160 MG PO TABS: 1.0000 | ORAL_TABLET | Freq: Two times a day (BID) | ORAL | 0 refills | Status: DC

## 2019-04-20 ENCOUNTER — Telehealth: Payer: Self-pay | Admitting: Family Medicine

## 2019-04-20 ENCOUNTER — Other Ambulatory Visit: Payer: Self-pay | Admitting: Family Medicine

## 2019-04-20 DIAGNOSIS — Z87448 Personal history of other diseases of urinary system: Secondary | ICD-10-CM

## 2019-04-20 NOTE — Telephone Encounter (Signed)
She should have a prescription for Septra DS on hand for UTI.  If she has taken that prescription and still having issues, she needs to drop off an urine for UA and culture.

## 2019-04-20 NOTE — Telephone Encounter (Signed)
LMOM for patient to call and schedule a lab visit for a urine and urine culture if she is out of the ABX.

## 2019-04-20 NOTE — Telephone Encounter (Signed)
Patient called and states she is having symptoms of a UTI. She states her symptoms are bloating and frequency. I informed her that she will need to make an appointment. She states "you told her that she can call and get an ABX for the UTI and then if it didn't get better I will need to schedule and appointment." Please advise.

## 2019-04-21 ENCOUNTER — Other Ambulatory Visit: Payer: PRIVATE HEALTH INSURANCE

## 2019-04-21 ENCOUNTER — Other Ambulatory Visit: Payer: Self-pay

## 2019-04-21 DIAGNOSIS — Z87448 Personal history of other diseases of urinary system: Secondary | ICD-10-CM

## 2019-04-22 ENCOUNTER — Telehealth: Payer: Self-pay | Admitting: Urology

## 2019-04-22 LAB — URINALYSIS, COMPLETE
Bilirubin, UA: NEGATIVE
Glucose, UA: NEGATIVE
Ketones, UA: NEGATIVE
Nitrite, UA: POSITIVE — AB
Protein,UA: NEGATIVE
RBC, UA: NEGATIVE
Specific Gravity, UA: 1.02 (ref 1.005–1.030)
Urobilinogen, Ur: 0.2 mg/dL (ref 0.2–1.0)
pH, UA: 7.5 (ref 5.0–7.5)

## 2019-04-22 LAB — MICROSCOPIC EXAMINATION: RBC, Urine: NONE SEEN /hpf (ref 0–2)

## 2019-04-22 MED ORDER — NITROFURANTOIN MONOHYD MACRO 100 MG PO CAPS: 100.0000 mg | ORAL_CAPSULE | Freq: Two times a day (BID) | ORAL | 0 refills | Status: DC

## 2019-04-22 NOTE — Telephone Encounter (Signed)
Patient notified ABX sent to pharmacy. °

## 2019-04-22 NOTE — Telephone Encounter (Signed)
Please let [name] know that her urine looks infected and would like her to start nitrofurantoin 100 mg, BID x 7 days.  Culture is still pending.

## 2019-04-27 LAB — CULTURE, URINE COMPREHENSIVE

## 2019-04-28 ENCOUNTER — Telehealth: Payer: Self-pay | Admitting: Family Medicine

## 2019-04-28 NOTE — Telephone Encounter (Signed)
Patient notified and voiced understanding.

## 2019-04-28 NOTE — Telephone Encounter (Signed)
-----   Message from Harle Battiest, PA-C sent at 04/28/2019  9:20 AM EST ----- Please let Mrs. Dewilde know that her urine culture was positive for infection and that the bacteria is sensitive to the nitrofurantoin.

## 2019-05-26 ENCOUNTER — Telehealth: Payer: Self-pay

## 2019-05-26 NOTE — Telephone Encounter (Signed)
Patient called stating that she is having pressure and urinary urgency, she would like to have an appointment to rule out UTI. Patient was added to PA schedule tomorrow

## 2019-05-27 ENCOUNTER — Ambulatory Visit (INDEPENDENT_AMBULATORY_CARE_PROVIDER_SITE_OTHER): Payer: No Typology Code available for payment source | Admitting: Physician Assistant

## 2019-05-27 ENCOUNTER — Ambulatory Visit: Payer: Self-pay | Admitting: Urology

## 2019-05-27 ENCOUNTER — Other Ambulatory Visit: Payer: Self-pay

## 2019-05-27 VITALS — BP 162/82 | HR 105 | Ht 62.0 in | Wt 158.0 lb

## 2019-05-27 DIAGNOSIS — Z87440 Personal history of urinary (tract) infections: Secondary | ICD-10-CM

## 2019-05-27 DIAGNOSIS — R102 Pelvic and perineal pain: Secondary | ICD-10-CM | POA: Diagnosis not present

## 2019-05-27 LAB — URINALYSIS, COMPLETE
Bilirubin, UA: NEGATIVE
Glucose, UA: NEGATIVE
Ketones, UA: NEGATIVE
Nitrite, UA: POSITIVE — AB
RBC, UA: NEGATIVE
Specific Gravity, UA: 1.025 (ref 1.005–1.030)
Urobilinogen, Ur: 0.2 mg/dL (ref 0.2–1.0)
pH, UA: 6 (ref 5.0–7.5)

## 2019-05-27 LAB — MICROSCOPIC EXAMINATION: RBC, Urine: NONE SEEN /hpf (ref 0–2)

## 2019-05-27 LAB — BLADDER SCAN AMB NON-IMAGING: Scan Result: 0

## 2019-05-27 MED ORDER — NITROFURANTOIN MONOHYD MACRO 100 MG PO CAPS: 100.0000 mg | ORAL_CAPSULE | Freq: Two times a day (BID) | ORAL | 0 refills | Status: AC

## 2019-05-27 NOTE — Progress Notes (Signed)
05/27/2019 2:39 PM   [name] Feb 04, 1961 [id number]  CC: Lower abdominal pressure  HPI: [name] is a 59 y.o. female with PMH hematuria and recurrent UTI who presents today for evaluation of possible UTI.  Most recent UTI approximately 1 month ago.  Urine culture grew Klebsiella pneumoniae with intermediate susceptibility to ampicillin and cefepime.  She was treated with Macrobid 100 mg twice daily x7 days.  Today, she reports a 1 day history of lower abdominal pressure and spasm-like discomfort without urgency.  She states the symptoms are similar to what she experienced with her last UTI.  She states her symptoms resolved entirely on Macrobid last month.  She has taken Tylenol at home with moderate symptom palliation.  She is taking cranberry supplements once daily.  She is not on a daily probiotic.  She reports only occasional constipation.  In-office UA today positive for 1+ protein, nitrites, and 1+ leukocyte esterase; urine microscopy with 11-30 WBCs/HPF and many bacteria. PVR 58mL.  PMH: Past Medical History:  Diagnosis Date  . Allergic rhinitis due to pollen   . Epistaxis, recurrent   . Hypercholesteremia   . Hypertension   . Recurrent UTI   . Tachycardia    likely brief SVT    Surgical History: Past Surgical History:  Procedure Laterality Date  . LAPAROSCOPIC APPENDECTOMY N/A 06/30/2013   Procedure: APPENDECTOMY LAPAROSCOPIC;  Surgeon: Shelly Rubenstein, MD;  Location: [facility];  Service: General;  Laterality: N/A;    Home Medications:  Allergies as of 05/27/2019      Reactions   Cephalexin Hives, Rash      Medication List       Accurate as of May 27, 2019  2:39 PM. If you have any questions, ask your nurse or doctor.        STOP taking these medications   nitrofurantoin (macrocrystal-monohydrate) 100 MG capsule Commonly known as: MACROBID Stopped by: Carman Ching, PA-C   sulfamethoxazole-trimethoprim 800-160 MG  tablet Commonly known as: BACTRIM DS Stopped by: Carman Ching, PA-C     TAKE these medications   aspirin EC 81 MG tablet Take 81 mg by mouth daily.   atorvastatin 10 MG tablet Commonly known as: LIPITOR TAKE 1 TABLET BY MOUTH EVERY DAY   CALCIUM-VITAMIN D PO Take 1 tablet by mouth daily.   CRANBERRY EXTRACT PO Take 1-2 tablets by mouth daily.   MAGNESIUM PO Take 400 mg by mouth daily.   metoprolol succinate 50 MG 24 hr tablet Commonly known as: TOPROL-XL TAKE 1 TABLET (50 MG TOTAL) BY MOUTH DAILY. TAKE WITH OR IMMEDIATELY FOLLOWING A MEAL.   multivitamin with minerals Tabs tablet Take 1 tablet by mouth daily.   triamcinolone cream 0.1 % Commonly known as: KENALOG Apply topically 2 (two) times daily as needed.   VITAMIN B COMPLEX PO Take 1 tablet by mouth daily.   VITAMIN C PO Take 500 Units by mouth daily.       Allergies:  Allergies  Allergen Reactions  . Cephalexin Hives and Rash    Family History: Family History  Problem Relation Age of Onset  . Heart disease Mother   . Diabetes Mother   . Arthritis Father   . Hyperlipidemia Sister   . Obesity Brother   . Cancer Maternal Grandmother        melanoma  . Heart disease Paternal Grandfather   . Kidney disease Neg Hx   . Kidney cancer Neg Hx   . Bladder Cancer Neg Hx  Social History:   reports that she has been smoking e-cigarettes and cigarettes. She has never used smokeless tobacco. She reports current alcohol use. She reports that she does not use drugs.  Physical Exam: BP (!) 162/82   Pulse (!) 105   Ht 5\' 2"  (1.575 m)   Wt 158 lb (71.7 kg)   BMI 28.90 kg/m   Constitutional:  Alert and oriented, no acute distress, nontoxic appearing HEENT: [place], AT Cardiovascular: No clubbing, cyanosis, or edema Respiratory: Normal respiratory effort, no increased work of breathing Skin: No rashes, bruises or suspicious lesions Neurologic: Grossly intact, no focal deficits, moving all 4  extremities Psychiatric: Normal mood and affect  Laboratory Data: Results for orders placed or performed in visit on 05/27/19  Microscopic Examination   URINE  Result Value Ref Range   WBC, UA 11-30 (A) 0 - 5 /hpf   RBC None seen 0 - 2 /hpf   Epithelial Cells (non renal) 0-10 0 - 10 /hpf   Casts Present (A) None seen /lpf   Cast Type Hyaline casts N/A   Bacteria, UA Many (A) None seen/Few  Urinalysis, Complete  Result Value Ref Range   Specific Gravity, UA 1.025 1.005 - 1.030   pH, UA 6.0 5.0 - 7.5   Color, UA Yellow Yellow   Appearance Ur Cloudy (A) Clear   Leukocytes,UA 1+ (A) Negative   Protein,UA 1+ (A) Negative/Trace   Glucose, UA Negative Negative   Ketones, UA Negative Negative   RBC, UA Negative Negative   Bilirubin, UA Negative Negative   Urobilinogen, Ur 0.2 0.2 - 1.0 mg/dL   Nitrite, UA Positive (A) Negative   Microscopic Examination See below:   BLADDER SCAN AMB NON-IMAGING  Result Value Ref Range   Scan Result 0 ml    Assessment & Plan:   1. Suprapubic pressure 59 year old female with a history of recurrent UTI presents with a 1 day history of lower abdominal pressure and spasm-like discomfort consistent with prior UTIs.  UA grossly infected today, will send for culture.  PVR WNL.  I am starting her on empiric Macrobid 100mg  twice daily x7 days.  I counseled her that I would contact her early next week if her urine culture indicated a necessary change in therapy.  She expressed understanding. - Urinalysis, Complete - BLADDER SCAN AMB NON-IMAGING - CULTURE, URINE COMPREHENSIVE - nitrofurantoin, macrocrystal-monohydrate, (MACROBID) 100 MG capsule; Take 1 capsule (100 mg total) by mouth every 12 (twelve) hours for 7 days.  Dispense: 14 capsule; Refill: 0  2. History of recurrent UTIs Counseled patient to increase cranberry supplementation to twice daily, start a daily probiotic ideally containing lactobacillus, and use MiraLAX as needed for management of  occasional constipation.  She expressed understanding.  Return if symptoms worsen or fail to improve.  Debroah Loop, PA-C  [facility] [address] [place], [place] [address] [phone]

## 2019-05-27 NOTE — Patient Instructions (Signed)
Recurrent UTI Prevention Strategies  1. Continue taking an over-the-counter cranberry supplement for urinary tract health. Increase this to twice daily on an empty stomach, e.g. right before bed. 2. Start taking an over-the-counter probiotic, preferably containing lactobacillus. Take this daily.

## 2019-05-31 ENCOUNTER — Telehealth: Payer: Self-pay | Admitting: Physician Assistant

## 2019-05-31 LAB — CULTURE, URINE COMPREHENSIVE

## 2019-05-31 MED ORDER — SULFAMETHOXAZOLE-TRIMETHOPRIM 800-160 MG PO TABS: 1.0000 | ORAL_TABLET | Freq: Two times a day (BID) | ORAL | 0 refills | Status: AC

## 2019-05-31 NOTE — Telephone Encounter (Signed)
Notified patient as instructed, patient pleased. Discussed follow-up appointments, patient agrees  

## 2019-05-31 NOTE — Telephone Encounter (Signed)
Please contact the patient and inform her that her urine culture has come back and I need to change her antibiotics.  I sent a prescription for Bactrim DS twice daily x5 days to the [facility] [place].  She should stop her prescribed Macrobid and switch to this medication ASAP.  I hope she feels better soon.

## 2019-10-07 ENCOUNTER — Encounter: Payer: No Typology Code available for payment source | Admitting: Internal Medicine

## 2019-10-18 ENCOUNTER — Ambulatory Visit (INDEPENDENT_AMBULATORY_CARE_PROVIDER_SITE_OTHER): Payer: No Typology Code available for payment source | Admitting: Internal Medicine

## 2019-10-18 ENCOUNTER — Encounter: Payer: Self-pay | Admitting: Internal Medicine

## 2019-10-18 ENCOUNTER — Other Ambulatory Visit: Payer: Self-pay

## 2019-10-18 VITALS — BP 136/88 | HR 86 | Temp 97.8°F | Ht 62.5 in | Wt 155.0 lb

## 2019-10-18 DIAGNOSIS — I10 Essential (primary) hypertension: Secondary | ICD-10-CM

## 2019-10-18 DIAGNOSIS — Z1211 Encounter for screening for malignant neoplasm of colon: Secondary | ICD-10-CM

## 2019-10-18 DIAGNOSIS — Z0000 Encounter for general adult medical examination without abnormal findings: Secondary | ICD-10-CM | POA: Diagnosis not present

## 2019-10-18 DIAGNOSIS — N390 Urinary tract infection, site not specified: Secondary | ICD-10-CM | POA: Diagnosis not present

## 2019-10-18 DIAGNOSIS — E7800 Pure hypercholesterolemia, unspecified: Secondary | ICD-10-CM | POA: Diagnosis not present

## 2019-10-18 LAB — COMPREHENSIVE METABOLIC PANEL
ALT: 17 U/L (ref 0–35)
AST: 18 U/L (ref 0–37)
Albumin: 4.6 g/dL (ref 3.5–5.2)
Alkaline Phosphatase: 108 U/L (ref 39–117)
BUN: 10 mg/dL (ref 6–23)
CO2: 27 mEq/L (ref 19–32)
Calcium: 10.1 mg/dL (ref 8.4–10.5)
Chloride: 102 mEq/L (ref 96–112)
Creatinine, Ser: 0.7 mg/dL (ref 0.40–1.20)
GFR: 85.13 mL/min (ref >60.00)
Glucose, Bld: 95 mg/dL (ref 70–99)
Potassium: 3.8 mEq/L (ref 3.5–5.1)
Sodium: 137 mEq/L (ref 135–145)
Total Bilirubin: 0.5 mg/dL (ref 0.2–1.2)
Total Protein: 7.4 g/dL (ref 6.0–8.3)

## 2019-10-18 LAB — LIPID PANEL
Cholesterol: 203 mg/dL — ABNORMAL HIGH (ref 0–200)
HDL: 47.2 mg/dL (ref >39.00)
Total CHOL/HDL Ratio: 4
Triglycerides: 411 mg/dL — ABNORMAL HIGH (ref 0.0–149.0)

## 2019-10-18 LAB — CBC
HCT: 43.1 % (ref 36.0–46.0)
Hemoglobin: 14.9 g/dL (ref 12.0–15.0)
MCHC: 34.5 g/dL (ref 30.0–36.0)
MCV: 92.6 fl (ref 78.0–100.0)
Platelets: 268 10*3/uL (ref 150.0–400.0)
RBC: 4.66 Mil/uL (ref 3.87–5.11)
RDW: 13.5 % (ref 11.5–15.5)
WBC: 8.2 10*3/uL (ref 4.0–10.5)

## 2019-10-18 LAB — LDL CHOLESTEROL, DIRECT: Direct LDL: 89 mg/dL

## 2019-10-18 NOTE — Assessment & Plan Note (Addendum)
No recent recurrence Did very well with nitrofurantoin Consider topical estrogen if recurrences increase

## 2019-10-18 NOTE — Progress Notes (Signed)
Subjective:    Patient ID: [name], female    DOB: 1960-06-30, 59 y.o.   MRN: [id number]  HPI Here for physical This visit occurred during the SARS-CoV-2 public health emergency.  Safety protocols were in place, including screening questions prior to the visit, additional usage of staff PPE, and extensive cleaning of exam room while observing appropriate contact time as indicated for disinfecting solutions.   Has had a couple of UTIs Got a different type of antibiotic from urologist Has been fine since taking macrodantin On cranberry and probiotic  Continues on statin and BP meds---no problems  Keeps up with gyn Going next month---needs mammogram  Current Outpatient Medications on File Prior to Visit  Medication Sig Dispense Refill  . Ascorbic Acid (VITAMIN C PO) Take 500 Units by mouth daily.     Marland Kitchen aspirin EC 81 MG tablet Take 81 mg by mouth daily.    Marland Kitchen atorvastatin (LIPITOR) 10 MG tablet TAKE 1 TABLET BY MOUTH EVERY DAY 90 tablet 3  . B Complex Vitamins (VITAMIN B COMPLEX PO) Take 1 tablet by mouth daily.    Marland Kitchen CALCIUM-VITAMIN D PO Take 1 tablet by mouth daily.    Marland Kitchen CRANBERRY EXTRACT PO Take 1-2 tablets by mouth daily.    Marland Kitchen MAGNESIUM PO Take 400 mg by mouth daily.     . metoprolol succinate (TOPROL-XL) 50 MG 24 hr tablet TAKE 1 TABLET (50 MG TOTAL) BY MOUTH DAILY. TAKE WITH OR IMMEDIATELY FOLLOWING A MEAL. 90 tablet 3  . Multiple Vitamin (MULTIVITAMIN WITH MINERALS) TABS tablet Take 1 tablet by mouth daily.    . Probiotic Product (PROBIOTIC PO) Take by mouth.    . triamcinolone cream (KENALOG) 0.1 % Apply topically 2 (two) times daily as needed. 45 g 2   No current facility-administered medications on file prior to visit.    Allergies  Allergen Reactions  . Cephalexin Hives and Rash    Past Medical History:  Diagnosis Date  . Allergic rhinitis due to pollen   . Epistaxis, recurrent   . Hypercholesteremia   . Hypertension   . Recurrent UTI   . Tachycardia     likely brief SVT    Past Surgical History:  Procedure Laterality Date  . LAPAROSCOPIC APPENDECTOMY N/A 06/30/2013   Procedure: APPENDECTOMY LAPAROSCOPIC;  Surgeon: Shelly Rubenstein, MD;  Location: [facility];  Service: General;  Laterality: N/A;    Family History  Problem Relation Age of Onset  . Heart disease Mother   . Diabetes Mother   . Arthritis Father   . Hyperlipidemia Sister   . Obesity Brother   . Cancer Maternal Grandmother        melanoma  . Heart disease Paternal Grandfather   . Kidney disease Neg Hx   . Kidney cancer Neg Hx   . Bladder Cancer Neg Hx     Social History   Socioeconomic History  . Marital status: Widowed    Spouse name: Not on file  . Number of children: 2  . Years of education: Not on file  . Highest education level: Not on file  Occupational History  . Occupation: housewife  Tobacco Use  . Smoking status: Current Every Day Smoker    Types: E-cigarettes, Cigarettes  . Smokeless tobacco: Never Used  Vaping Use  . Vaping Use: Every day  Substance and Sexual Activity  . Alcohol use: Yes    Alcohol/week: 0.0 standard drinks    Comment: rare alcohol  . Drug use:  No  . Sexual activity: Not on file  Other Topics Concern  . Not on file  Social History Narrative   2 daughters   Social Determinants of Health   Financial Resource Strain:   . Difficulty of Paying Living Expenses:   Food Insecurity:   . Worried About Programme researcher, broadcasting/film/video in the Last Year:   . Barista in the Last Year:   Transportation Needs:   . Freight forwarder (Medical):   Marland Kitchen Lack of Transportation (Non-Medical):   Physical Activity:   . Days of Exercise per Week:   . Minutes of Exercise per Session:   Stress:   . Feeling of Stress :   Social Connections:   . Frequency of Communication with Friends and Family:   . Frequency of Social Gatherings with Friends and Family:   . Attends Religious Services:   . Active Member of Clubs or Organizations:   .  Attends Banker Meetings:   Marland Kitchen Marital Status:   Intimate Partner Violence:   . Fear of Current or Ex-Partner:   . Emotionally Abused:   Marland Kitchen Physically Abused:   . Sexually Abused:    Review of Systems  Constitutional: Negative for fatigue and unexpected weight change.       Wears seat belt Trying to walk regularly  HENT: Negative for dental problem, hearing loss and tinnitus.        Keeps up with dentist  Eyes: Negative for visual disturbance.       No diplopia or unilateral vision loss  Respiratory: Negative for cough, chest tightness and shortness of breath.   Cardiovascular: Negative for chest pain, palpitations and leg swelling.  Gastrointestinal: Negative for abdominal pain and constipation.       Rare blood from hemorrhoid  Endocrine: Negative for polydipsia and polyuria.  Genitourinary: Negative for difficulty urinating and dyspareunia.  Musculoskeletal: Negative for arthralgias and joint swelling.       Occ low back pain---if she does more (like in yard)  Skin: Negative for rash.       No suspicious lesions  Allergic/Immunologic: Positive for environmental allergies. Negative for immunocompromised state.       Mostly fall---uses loratadine  Neurological: Negative for dizziness, syncope, light-headedness and headaches.  Hematological: Negative for adenopathy. Does not bruise/bleed easily.  Psychiatric/Behavioral: Negative for dysphoric mood and sleep disturbance. The patient is not nervous/anxious.        Objective:   Physical Exam Constitutional:      Appearance: Normal appearance.  HENT:     Head: Normocephalic and atraumatic.     Right Ear: Tympanic membrane and ear canal normal.     Left Ear: Tympanic membrane and ear canal normal.     Mouth/Throat:     Comments: No lesions Eyes:     Conjunctiva/sclera: Conjunctivae normal.     Pupils: Pupils are equal, round, and reactive to light.  Cardiovascular:     Rate and Rhythm: Normal rate and regular  rhythm.     Pulses: Normal pulses.     Heart sounds: No murmur heard.  No gallop.   Pulmonary:     Effort: Pulmonary effort is normal.     Breath sounds: Normal breath sounds. No wheezing or rales.  Abdominal:     Palpations: Abdomen is soft.     Tenderness: There is no abdominal tenderness.  Musculoskeletal:     Cervical back: Neck supple.     Right lower leg: No edema.  Left lower leg: No edema.  Lymphadenopathy:     Cervical: No cervical adenopathy.  Skin:    Findings: No rash.  Neurological:     General: No focal deficit present.     Mental Status: She is alert and oriented to person, place, and time.  Psychiatric:        Mood and Affect: Mood normal.        Behavior: Behavior normal.            Assessment & Plan:

## 2019-10-18 NOTE — Assessment & Plan Note (Signed)
BP Readings from Last 3 Encounters:  10/18/19 136/88  05/27/19 (!) 162/82  11/30/18 (!) 155/80   Good control on the metoprolol

## 2019-10-18 NOTE — Assessment & Plan Note (Signed)
No problems with primary prevention with statin 

## 2019-10-18 NOTE — Assessment & Plan Note (Signed)
Still vaping---discussed quitting Discussed exercise Adamant about no COVID--and probably no flu vaccines Colon due --will make referral Mammogram due at gyn this fall

## 2019-10-20 ENCOUNTER — Encounter: Payer: Self-pay | Admitting: Gastroenterology

## 2019-11-04 ENCOUNTER — Other Ambulatory Visit: Payer: Self-pay | Admitting: Internal Medicine

## 2019-11-29 NOTE — Progress Notes (Signed)
11/30/2019 9:33 AM   [name] 02-08-1961 [id number]  Referring provider: Venia Carbon, MD [address] [place],  [place] [address]  Chief Complaint  Patient presents with  . Follow-up    HPI: [name] is a 59 y.o. female with high risk hematuria and rUTI's who presents today for a 1 year follow-up.  High risk hematuria  Smoker.  She underwent a CT of the abdomen and pelvis with contrast on 07/04/2013.  She was found to have a ruptured appendix at that time.  She did have small bilateral extrarenal pelves. A benign 1 cm Bosniak type I cyst in the left kidney. And a 4 mm nonenhancing peripheral nodule in the left kidney.  She is a smoker with a 30 ppd history.  Patient completed a hematuria work up with CT Urogram and cystoscopy.  CT Urogram performed on 08/07/2015 noted no findings identified to explain patient's hematuria.  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  Aortic atherosclerosis.  Cystocele.  Lumbar spondylosis.  Cystoscopy with Dr. Alyson Ingles performed on 08/14/2015 was normal.  She does not report any gross hematuria.  Her UA today is negative for micro heme.   rUTI's Risk factors: age, postmenopausal, sugary drinks and constipation  Date UTI  Antibiotics   04/21/2019 Klebsiella pneumoniae Macrobid 100 mg BID x 7 days  05/27/2019 Klebsiella pneumoniae Septra DS BID x 5 days   Asymptomatic at today's visit.    PMH: Past Medical History:  Diagnosis Date  . Allergic rhinitis due to pollen   . Epistaxis, recurrent   . Hypercholesteremia   . Hypertension   . Recurrent UTI   . Tachycardia    likely brief SVT    Surgical History: Past Surgical History:  Procedure Laterality Date  . LAPAROSCOPIC APPENDECTOMY N/A 06/30/2013   Procedure: APPENDECTOMY LAPAROSCOPIC;  Surgeon: Harl Bowie, MD;  Location: [facility];  Service: General;  Laterality: N/A;    Home Medications:  Allergies as of 11/30/2019      Reactions   Cephalexin  Hives, Rash      Medication List       Accurate as of November 30, 2019  9:33 AM. If you have any questions, ask your nurse or doctor.        aspirin EC 81 MG tablet Take 81 mg by mouth daily.   atorvastatin 10 MG tablet Commonly known as: LIPITOR TAKE 1 TABLET BY MOUTH EVERY DAY   CALCIUM-VITAMIN D PO Take 1 tablet by mouth daily.   CRANBERRY EXTRACT PO Take 1-2 tablets by mouth daily.   MAGNESIUM PO Take 400 mg by mouth daily.   metoprolol succinate 50 MG 24 hr tablet Commonly known as: TOPROL-XL TAKE 1 TABLET (50 MG TOTAL) BY MOUTH DAILY. TAKE WITH OR IMMEDIATELY FOLLOWING A MEAL.   multivitamin with minerals Tabs tablet Take 1 tablet by mouth daily.   PROBIOTIC PO Take by mouth.   triamcinolone cream 0.1 % Commonly known as: KENALOG Apply topically 2 (two) times daily as needed.   VITAMIN B COMPLEX PO Take 1 tablet by mouth daily.   VITAMIN C PO Take 500 Units by mouth daily.       Allergies:  Allergies  Allergen Reactions  . Cephalexin Hives and Rash    Family History: Family History  Problem Relation Age of Onset  . Heart disease Mother   . Diabetes Mother   . Arthritis Father   . Hyperlipidemia Sister   . Obesity  Brother   . Cancer Maternal Grandmother        melanoma  . Heart disease Paternal Grandfather   . Kidney disease Neg Hx   . Kidney cancer Neg Hx   . Bladder Cancer Neg Hx     Social History:  reports that she has been smoking e-cigarettes and cigarettes. She has never used smokeless tobacco. She reports current alcohol use. She reports that she does not use drugs.  ROS: For pertinent review of systems please refer to history of present illness  Physical Exam: BP (!) 173/99 (BP Location: Left Arm, Patient Position: Sitting, Cuff Size: Normal)   Pulse 87   Ht $R'5\' 2"'Wv$  (1.575 m)   Wt 153 lb 6.4 oz (69.6 kg)   BMI 28.06 kg/m   Constitutional:  Well nourished. Alert and oriented, No acute distress. HEENT:  AT, moist mucus  membranes.  Trachea midline, no masses. Cardiovascular: No clubbing, cyanosis, or edema. Respiratory: Normal respiratory effort, no increased work of breathing. Neurologic: Grossly intact, no focal deficits, moving all 4 extremities. Psychiatric: Normal mood and affect.   Laboratory Data: Lab Results  Component Value Date   WBC 8.2 10/18/2019   HGB 14.9 10/18/2019   HCT 43.1 10/18/2019   MCV 92.6 10/18/2019   PLT 268.0 10/18/2019    Lab Results  Component Value Date   CREATININE 0.70 10/18/2019      Component Value Date/Time   CHOL 203 (H) 10/18/2019 1006   HDL 47.20 10/18/2019 1006   CHOLHDL 4 10/18/2019 1006   VLDL 63.2 (H) 09/25/2017 1023   LDLCALC 84 09/05/2014 1304    Lab Results  Component Value Date   AST 18 10/18/2019   Lab Results  Component Value Date   ALT 17 10/18/2019   Results for orders placed or performed in visit on 10/18/19  Comprehensive metabolic panel  Result Value Ref Range   Sodium 137 135 - 145 mEq/L   Potassium 3.8 3.5 - 5.1 mEq/L   Chloride 102 96 - 112 mEq/L   CO2 27 19 - 32 mEq/L   Glucose, Bld 95 70 - 99 mg/dL   BUN 10 6 - 23 mg/dL   Creatinine, Ser 0.70 0.40 - 1.20 mg/dL   Total Bilirubin 0.5 0.2 - 1.2 mg/dL   Alkaline Phosphatase 108 39 - 117 U/L   AST 18 0 - 37 U/L   ALT 17 0 - 35 U/L   Total Protein 7.4 6.0 - 8.3 g/dL   Albumin 4.6 3.5 - 5.2 g/dL   GFR 85.13 >60.00 mL/min   Calcium 10.1 8.4 - 10.5 mg/dL  Lipid panel  Result Value Ref Range   Cholesterol 203 (H) 0 - 200 mg/dL   Triglycerides (H) 0 - 149 mg/dL    411.0 Triglyceride is over 400; calculations on Lipids are invalid.   HDL 47.20 >39.00 mg/dL   Total CHOL/HDL Ratio 4   CBC  Result Value Ref Range   WBC 8.2 4.0 - 10.5 K/uL   RBC 4.66 3.87 - 5.11 Mil/uL   Platelets 268.0 150 - 400 K/uL   Hemoglobin 14.9 12.0 - 15.0 g/dL   HCT 43.1 36 - 46 %   MCV 92.6 78.0 - 100.0 fl   MCHC 34.5 30.0 - 36.0 g/dL   RDW 13.5 11.5 - 15.5 %  LDL cholesterol, direct  Result  Value Ref Range   Direct LDL 89.0 mg/dL   Urinalysis Component     Latest Ref Rng & Units 11/30/2019  Specific  Gravity, UA     1.005 - 1.030 1.020  pH, UA     5.0 - 7.5 7.5  Color, UA     Yellow Yellow  Appearance Ur     Clear Cloudy (A)  Leukocytes,UA     Negative Trace (A)  Protein,UA     Negative/Trace Negative  Glucose, UA     Negative Negative  Ketones, UA     Negative Negative  RBC, UA     Negative Negative  Bilirubin, UA     Negative Negative  Urobilinogen, Ur     0.2 - 1.0 mg/dL 0.2  Nitrite, UA     Negative Positive (A)  Microscopic Examination      See below:   Component     Latest Ref Rng & Units 11/30/2019  WBC, UA     0 - 5 /hpf 6-10 (A)  RBC     0 - 2 /hpf 0-2  Epithelial Cells (non renal)     0 - 10 /hpf 0-10  Renal Epithel, UA     None seen /hpf 0-10 (A)  Bacteria, UA     None seen/Few Many (A)   I have reviewed the labs   Assessment & Plan:    1. High risk hematuria:    Completed hematuria work up with CT Urogram and cystoscopy in 2017- no GU malignancies were found UA today negative for micro heme No reports of gross hematuria RTC in one year for UA   2. rUTI's Criteria for recurrent UTI has been met with 2 or more infections in 6 months or 3 or greater infections in one year  Patient has adhered to Debroah Loop, PA-C suggestion regarding probiotics and has noticed a reduce in stomach pain and also has not had issues with urinary tract infections She is also cut back on her sugary drink intake and also has been avoiding [facility] Patient to report any breakthrough infection Patient to return in 1 year for symptom recheck                                             Return in about 1 year (around 11/29/2020) for UA and symptom recheck .  These notes generated with voice recognition software. I apologize for typographical errors.  Zara Council, PA-C  [facility] [address] [place], [place] [address] [phone]

## 2019-11-30 ENCOUNTER — Ambulatory Visit (INDEPENDENT_AMBULATORY_CARE_PROVIDER_SITE_OTHER): Payer: No Typology Code available for payment source | Admitting: Urology

## 2019-11-30 ENCOUNTER — Encounter: Payer: Self-pay | Admitting: Urology

## 2019-11-30 ENCOUNTER — Other Ambulatory Visit: Payer: Self-pay

## 2019-11-30 VITALS — BP 173/99 | HR 87 | Ht 62.0 in | Wt 153.4 lb

## 2019-11-30 DIAGNOSIS — N390 Urinary tract infection, site not specified: Secondary | ICD-10-CM | POA: Diagnosis not present

## 2019-11-30 DIAGNOSIS — R319 Hematuria, unspecified: Secondary | ICD-10-CM

## 2019-12-01 LAB — URINALYSIS, COMPLETE
Bilirubin, UA: NEGATIVE
Glucose, UA: NEGATIVE
Ketones, UA: NEGATIVE
Nitrite, UA: POSITIVE — AB
Protein,UA: NEGATIVE
RBC, UA: NEGATIVE
Specific Gravity, UA: 1.02 (ref 1.005–1.030)
Urobilinogen, Ur: 0.2 mg/dL (ref 0.2–1.0)
pH, UA: 7.5 (ref 5.0–7.5)

## 2019-12-01 LAB — MICROSCOPIC EXAMINATION

## 2019-12-12 ENCOUNTER — Other Ambulatory Visit: Payer: Self-pay

## 2019-12-12 ENCOUNTER — Ambulatory Visit (AMBULATORY_SURGERY_CENTER): Payer: No Typology Code available for payment source | Admitting: *Deleted

## 2019-12-12 VITALS — Ht 62.0 in | Wt 153.0 lb

## 2019-12-12 DIAGNOSIS — Z1211 Encounter for screening for malignant neoplasm of colon: Secondary | ICD-10-CM

## 2019-12-12 DIAGNOSIS — Z01818 Encounter for other preprocedural examination: Secondary | ICD-10-CM

## 2019-12-12 MED ORDER — PLENVU 140 G PO SOLR: 1.0000 | Freq: Once | ORAL | 0 refills | Status: AC

## 2019-12-12 NOTE — Progress Notes (Signed)
No egg or soy allergy known to patient  No issues with past sedation with any surgeries or procedures no intubation problems in the past  No FH of Malignant Hyperthermia No diet pills per patient No home 02 use per patient  No blood thinners per patient  Pt denies issues with constipation  No A fib or A flutter  EMMI video to pt or via MyChart  COVID 19 guidelines implemented in PV today with Pt and RN   Plenvu Coupon given to pt in PV today , Code to Pharmacy   Due to the COVID-19 pandemic we are asking patients to follow these guidelines. Please only bring one care partner. Please be aware that your care partner may wait in the car in the parking lot or if they feel like they will be too hot to wait in the car, they may wait in the lobby on the 4th floor. All care partners are required to wear a mask the entire time (we do not have any that we can provide them), they need to practice social distancing, and we will do a Covid check for all patient's and care partners when you arrive. Also we will check their temperature and your temperature. If the care partner waits in their car they need to stay in the parking lot the entire time and we will call them on their cell phone when the patient is ready for discharge so they can bring the car to the front of the building. Also all patient's will need to wear a mask into building.  

## 2019-12-15 ENCOUNTER — Encounter: Payer: Self-pay | Admitting: Gastroenterology

## 2019-12-26 ENCOUNTER — Other Ambulatory Visit: Payer: Self-pay | Admitting: Gastroenterology

## 2019-12-26 ENCOUNTER — Encounter: Payer: No Typology Code available for payment source | Admitting: Gastroenterology

## 2019-12-26 LAB — SARS CORONAVIRUS 2 (TAT 6-24 HRS): SARS Coronavirus 2: NEGATIVE

## 2019-12-27 ENCOUNTER — Ambulatory Visit (AMBULATORY_SURGERY_CENTER): Payer: No Typology Code available for payment source | Admitting: Gastroenterology

## 2019-12-27 ENCOUNTER — Encounter: Payer: Self-pay | Admitting: Gastroenterology

## 2019-12-27 ENCOUNTER — Other Ambulatory Visit: Payer: Self-pay

## 2019-12-27 VITALS — BP 152/77 | HR 80 | Temp 98.7°F | Resp 18 | Ht 62.0 in | Wt 153.0 lb

## 2019-12-27 DIAGNOSIS — Z1211 Encounter for screening for malignant neoplasm of colon: Secondary | ICD-10-CM | POA: Diagnosis present

## 2019-12-27 HISTORY — PX: COLONOSCOPY WITH PROPOFOL: SHX5780

## 2019-12-27 MED ORDER — SODIUM CHLORIDE 0.9 % IV SOLN: 500.0000 mL | Freq: Once | INTRAVENOUS | Status: DC

## 2019-12-27 NOTE — Patient Instructions (Signed)
YOU HAD AN ENDOSCOPIC PROCEDURE TODAY AT THE [facility]:   Refer to the procedure report that was given to you for any specific questions about what was found during the examination.  If the procedure report does not answer your questions, please call your gastroenterologist to clarify.  If you requested that your care partner not be given the details of your procedure findings, then the procedure report has been included in a sealed envelope for you to review at your convenience later. ° °YOU SHOULD EXPECT: Some feelings of bloating in the abdomen. Passage of more gas than usual.  Walking can help get rid of the air that was put into your GI tract during the procedure and reduce the bloating. If you had a lower endoscopy (such as a colonoscopy or flexible sigmoidoscopy) you may notice spotting of blood in your stool or on the toilet paper. If you underwent a bowel prep for your procedure, you may not have a normal bowel movement for a few days. ° °Please Note:  You might notice some irritation and congestion in your nose or some drainage.  This is from the oxygen used during your procedure.  There is no need for concern and it should clear up in a day or so. ° °SYMPTOMS TO REPORT IMMEDIATELY: ° °· Following lower endoscopy (colonoscopy or flexible sigmoidoscopy): ° Excessive amounts of blood in the stool ° Significant tenderness or worsening of abdominal pains ° Swelling of the abdomen that is new, acute ° Fever of 100°F or higher ° ° °For urgent or emergent issues, a gastroenterologist can be reached at any hour by calling [phone]. °Do not use MyChart messaging for urgent concerns.  ° ° °DIET:  We do recommend a small meal at first, but then you may proceed to your regular diet.  Drink plenty of fluids but you should avoid alcoholic beverages for 24 hours. Follow a High Fiber Diet.  ° °MEDICATIONS: Continue present medications. ° °Please see handouts given to you by your recovery  nurse. ° °ACTIVITY:  You should plan to take it easy for the rest of today and you should NOT DRIVE or use heavy machinery until tomorrow (because of the sedation medicines used during the test).   ° °FOLLOW UP: °Our staff will call the number listed on your records 48-72 hours following your procedure to check on you and address any questions or concerns that you may have regarding the information given to you following your procedure. If we do not reach you, we will leave a message.  We will attempt to reach you two times.  During this call, we will ask if you have developed any symptoms of COVID 19. If you develop any symptoms (ie: fever, flu-like symptoms, shortness of breath, cough etc.) before then, please call [phone].  If you test positive for Covid 19 in the 2 weeks post procedure, please call and report this information to us.   ° °If any biopsies were taken you will be contacted by phone or by letter within the next 1-3 weeks.  Please call us at [phone] if you have not heard about the biopsies in 3 weeks.  ° °Thank you for allowing us to provide for your healthcare needs today. ° ° °SIGNATURES/CONFIDENTIALITY: °You and/or your care partner have signed paperwork which will be entered into your electronic medical record.  These signatures attest to the fact that that the information above on your After Visit Summary has been reviewed and is   understood.  Full responsibility of the confidentiality of this discharge information lies with you and/or your care-partner. °

## 2019-12-27 NOTE — Progress Notes (Signed)
Vital signs checked by:[facility]  The patient states no changes in medical or surgical history since pre-visit screening on 12/12/19.

## 2019-12-27 NOTE — Op Note (Signed)
[facility] Patient Name: Jacqueline PressmanSusan [name] Procedure Date: 12/27/2019 4:15 PM MRN: [id number] Endoscopist: Meryl DareMalcolm T Flay Ghosh , MD Age: [age] Referring MD:  Date of Birth: [birth year] Gender: Female Account #: [id number] Procedure:                Colonoscopy Indications:              Screening for colorectal malignant neoplasm Medicines:                Monitored Anesthesia Care Procedure:                Pre-Anesthesia Assessment:                           - Prior to the procedure, a History and Physical                            was performed, and patient medications and                            allergies were reviewed. The patient's tolerance of                            previous anesthesia was also reviewed. The risks                            and benefits of the procedure and the sedation                            options and risks were discussed with the patient.                            All questions were answered, and informed consent                            was obtained. Prior Anticoagulants: The patient has                            taken no previous anticoagulant or antiplatelet                            agents. ASA Grade Assessment: II - A patient with                            mild systemic disease. After reviewing the risks                            and benefits, the patient was deemed in                            satisfactory condition to undergo the procedure.                           After obtaining informed consent, the colonoscope  was passed under direct vision. Throughout the                            procedure, the patient's blood pressure, pulse, and                            oxygen saturations were monitored continuously. The                            Colonoscope was introduced through the anus and                            advanced to the the cecum, identified by                            appendiceal orifice and  ileocecal valve. The                            ileocecal valve, appendiceal orifice, and rectum                            were photographed. The quality of the bowel                            preparation was good. The colonoscopy was performed                            without difficulty. The patient tolerated the                            procedure well. Scope In: 4:23:02 PM Scope Out: 4:43:54 PM Scope Withdrawal Time: 0 hours 15 minutes 56 seconds  Total Procedure Duration: 0 hours 20 minutes 52 seconds  Findings:                 The perianal and digital rectal examinations showed                            external hemorroids and was otherwise normal.                           Scattered medium-mouthed diverticula were found in                            the right colon. There was no evidence of                            diverticular bleeding.                           Multiple medium-mouthed diverticula were found in                            the left colon. There was narrowing of the colon in  association with the diverticular opening. There                            was evidence of diverticular spasm. There was                            evidence of an impacted diverticulum. There was no                            evidence of diverticular bleeding.                           There was a medium-sized lipoma, 15 mm in diameter,                            in the transverse colon.                           External and internal hemorrhoids were found during                            retroflexion. The hemorrhoids were small and Grade                            I (internal hemorrhoids that do not prolapse).                           The exam was otherwise without abnormality on                            direct and retroflexion views. Complications:            No immediate complications. Estimated blood loss:                            None. Estimated  Blood Loss:     Estimated blood loss: none. Impression:               - Mild diverticulosis in the right colon.                           - Moderate diverticulosis in the left colon.                           - Medium-sized lipoma in the transverse colon.                           - External and internal hemorrhoids.                           - The examination was otherwise normal on direct                            and retroflexion views.                           -  No specimens collected. Recommendation:           - Repeat colonoscopy in 10 years for screening                            purposes.                           - Patient has a contact number available for                            emergencies. The signs and symptoms of potential                            delayed complications were discussed with the                            patient. Return to normal activities tomorrow.                            Written discharge instructions were provided to the                            patient.                           - High fiber diet.                           - Continue present medications. Meryl Dare, MD 12/27/2019 4:48:55 PM This report has been signed electronically.

## 2019-12-27 NOTE — Progress Notes (Signed)
Report to PACU, RN, vss, BBS= Clear.  

## 2019-12-29 ENCOUNTER — Telehealth: Payer: Self-pay

## 2019-12-29 NOTE — Telephone Encounter (Signed)
  Follow up Call-  Call back number 12/27/2019  Post procedure Call Back phone  # [phone]  Permission to leave phone message Yes  Some recent data might be hidden     Patient questions:  Do you have a fever, pain , or abdominal swelling? No. Pain Score  0 *  Have you tolerated food without any problems? Yes.    Have you been able to return to your normal activities? Yes.    Do you have any questions about your discharge instructions: Diet   No. Medications  No. Follow up visit  No.  Do you have questions or concerns about your Care? No.  Actions: * If pain score is 4 or above: No action needed, pain <4.  1. Have you developed a fever since your procedure? no  2.   Have you had an respiratory symptoms (SOB or cough) since your procedure? no  3.   Have you tested positive for COVID 19 since your procedure no  4.   Have you had any family members/close contacts diagnosed with the COVID 19 since your procedure?  no   If yes to any of these questions please route to Laverna Peace, RN and Karlton Lemon, RN

## 2020-10-19 ENCOUNTER — Encounter: Payer: Self-pay | Admitting: Internal Medicine

## 2020-10-19 ENCOUNTER — Ambulatory Visit (INDEPENDENT_AMBULATORY_CARE_PROVIDER_SITE_OTHER): Payer: 59 | Admitting: Internal Medicine

## 2020-10-19 ENCOUNTER — Other Ambulatory Visit: Payer: Self-pay

## 2020-10-19 VITALS — BP 136/88 | HR 81 | Temp 97.8°F | Ht 63.25 in | Wt 156.0 lb

## 2020-10-19 DIAGNOSIS — E7800 Pure hypercholesterolemia, unspecified: Secondary | ICD-10-CM

## 2020-10-19 DIAGNOSIS — Z0000 Encounter for general adult medical examination without abnormal findings: Secondary | ICD-10-CM

## 2020-10-19 DIAGNOSIS — N390 Urinary tract infection, site not specified: Secondary | ICD-10-CM | POA: Diagnosis not present

## 2020-10-19 DIAGNOSIS — I10 Essential (primary) hypertension: Secondary | ICD-10-CM | POA: Diagnosis not present

## 2020-10-19 LAB — LIPID PANEL
Cholesterol: 218 mg/dL — ABNORMAL HIGH (ref 0–200)
HDL: 48.2 mg/dL (ref >39.00)
NonHDL: 169.79
Total CHOL/HDL Ratio: 5
Triglycerides: 397 mg/dL — ABNORMAL HIGH (ref 0.0–149.0)
VLDL: 79.4 mg/dL — ABNORMAL HIGH (ref 0.0–40.0)

## 2020-10-19 LAB — COMPREHENSIVE METABOLIC PANEL
ALT: 19 U/L (ref 0–35)
AST: 19 U/L (ref 0–37)
Albumin: 4.5 g/dL (ref 3.5–5.2)
Alkaline Phosphatase: 109 U/L (ref 39–117)
BUN: 13 mg/dL (ref 6–23)
CO2: 26 mEq/L (ref 19–32)
Calcium: 10.1 mg/dL (ref 8.4–10.5)
Chloride: 103 mEq/L (ref 96–112)
Creatinine, Ser: 0.71 mg/dL (ref 0.40–1.20)
GFR: 91.52 mL/min (ref >60.00)
Glucose, Bld: 87 mg/dL (ref 70–99)
Potassium: 4.1 mEq/L (ref 3.5–5.1)
Sodium: 139 mEq/L (ref 135–145)
Total Bilirubin: 0.5 mg/dL (ref 0.2–1.2)
Total Protein: 7.4 g/dL (ref 6.0–8.3)

## 2020-10-19 LAB — CBC
HCT: 42.6 % (ref 36.0–46.0)
Hemoglobin: 14.5 g/dL (ref 12.0–15.0)
MCHC: 34.1 g/dL (ref 30.0–36.0)
MCV: 93.6 fl (ref 78.0–100.0)
Platelets: 272 10*3/uL (ref 150.0–400.0)
RBC: 4.55 Mil/uL (ref 3.87–5.11)
RDW: 13.3 % (ref 11.5–15.5)
WBC: 10 10*3/uL (ref 4.0–10.5)

## 2020-10-19 LAB — LDL CHOLESTEROL, DIRECT: Direct LDL: 106 mg/dL

## 2020-10-19 NOTE — Progress Notes (Signed)
Subjective:    Patient ID: [name], female    DOB: [birth year], 60 y.o.   MRN: [id number]  HPI Here for physical This visit occurred during the SARS-CoV-2 public health emergency.  Safety protocols were in place, including screening questions prior to the visit, additional usage of staff PPE, and extensive cleaning of exam room while observing appropriate contact time as indicated for disinfecting solutions.   Excited---having first grandchild later this month Doing well otherwise  Still vaping---counseled Walks some--does yard work  No recent UTIs--not in 6 months (since on probiotic) Stopped diet Mt Dew also  Current Outpatient Medications on File Prior to Visit  Medication Sig Dispense Refill   Ascorbic Acid (VITAMIN C PO) Take 500 Units by mouth daily.      aspirin EC 81 MG tablet Take 81 mg by mouth daily.     atorvastatin (LIPITOR) 10 MG tablet TAKE 1 TABLET BY MOUTH EVERY DAY 90 tablet 3   B Complex Vitamins (VITAMIN B COMPLEX PO) Take 1 tablet by mouth daily.     CALCIUM-VITAMIN D PO Take 1 tablet by mouth daily.     CRANBERRY EXTRACT PO Take 1-2 tablets by mouth daily.     MAGNESIUM PO Take 400 mg by mouth daily.      metoprolol succinate (TOPROL-XL) 50 MG 24 hr tablet TAKE 1 TABLET (50 MG TOTAL) BY MOUTH DAILY. TAKE WITH OR IMMEDIATELY FOLLOWING A MEAL. 90 tablet 3   Multiple Vitamin (MULTIVITAMIN WITH MINERALS) TABS tablet Take 1 tablet by mouth daily.     Probiotic Product (PROBIOTIC PO) Take by mouth.     No current facility-administered medications on file prior to visit.    Allergies  Allergen Reactions   Cephalexin Hives and Rash    Past Medical History:  Diagnosis Date   Allergic rhinitis due to pollen    Epistaxis, recurrent    Hypercholesteremia    Hypertension    Recurrent UTI    Tachycardia    likely brief SVT    Past Surgical History:  Procedure Laterality Date   COLONOSCOPY     at age 55   LAPAROSCOPIC APPENDECTOMY N/A 06/30/2013    Procedure: APPENDECTOMY LAPAROSCOPIC;  Surgeon: Shelly Rubenstein, MD;  Location: [facility] OR;  Service: General;  Laterality: N/A;    Family History  Problem Relation Age of Onset   Heart disease Mother    Diabetes Mother    Arthritis Father    Hyperlipidemia Sister    Obesity Brother    Cancer Maternal Grandmother        melanoma   Heart disease Paternal Grandfather    Kidney disease Neg Hx    Kidney cancer Neg Hx    Bladder Cancer Neg Hx    Colon cancer Neg Hx    Colon polyps Neg Hx    Stomach cancer Neg Hx    Esophageal cancer Neg Hx     Social History   Socioeconomic History   Marital status: Widowed    Spouse name: Not on file   Number of children: 2   Years of education: Not on file   Highest education level: Not on file  Occupational History   Occupation: housewife  Tobacco Use   Smoking status: Every Day    Packs/day: 1.00    Types: E-cigarettes, Cigarettes   Smokeless tobacco: Never  Vaping Use   Vaping Use: Some days  Substance and Sexual Activity   Alcohol use: Yes    Alcohol/week: 0.0  standard drinks    Comment: rare alcohol   Drug use: No   Sexual activity: Not on file  Other Topics Concern   Not on file  Social History Narrative   2 daughters   Social Determinants of Health   Financial Resource Strain: Not on file  Food Insecurity: Not on file  Transportation Needs: Not on file  Physical Activity: Not on file  Stress: Not on file  Social Connections: Not on file  Intimate Partner Violence: Not on file   Review of Systems  Constitutional:  Negative for fatigue and unexpected weight change.       Wears seat belt  HENT:  Negative for dental problem, hearing loss and tinnitus.        Keeps up with dentist  Eyes:  Negative for visual disturbance.       No diplopia or unilateral vision loss  Respiratory:  Negative for cough, chest tightness and shortness of breath.   Cardiovascular:  Negative for chest pain, palpitations and leg swelling.   Gastrointestinal:  Negative for blood in stool and constipation.       No heartburn  Endocrine: Negative for polydipsia and polyuria.  Genitourinary:  Negative for difficulty urinating, dysuria and hematuria.  Musculoskeletal:  Negative for back pain and joint swelling.       Occasional right shoulder pain  Skin:  Negative for rash.  Allergic/Immunologic: Positive for environmental allergies. Negative for immunocompromised state.       Seasonal symptoms---allegra helps  Neurological:  Negative for dizziness, syncope, light-headedness and headaches.  Hematological:  Negative for adenopathy. Does not bruise/bleed easily.  Psychiatric/Behavioral:  Negative for dysphoric mood and sleep disturbance. The patient is not nervous/anxious.       Objective:   Physical Exam Constitutional:      Appearance: Normal appearance.  HENT:     Right Ear: Tympanic membrane and ear canal normal.     Left Ear: Tympanic membrane and ear canal normal.     Mouth/Throat:     Pharynx: No oropharyngeal exudate or posterior oropharyngeal erythema.  Eyes:     Conjunctiva/sclera: Conjunctivae normal.     Pupils: Pupils are equal, round, and reactive to light.  Cardiovascular:     Rate and Rhythm: Normal rate and regular rhythm.     Pulses: Normal pulses.     Heart sounds: No murmur heard.   No gallop.  Pulmonary:     Effort: Pulmonary effort is normal.     Breath sounds: Normal breath sounds. No wheezing or rales.  Abdominal:     Palpations: Abdomen is soft.     Tenderness: There is no abdominal tenderness.  Musculoskeletal:     Cervical back: Neck supple.     Right lower leg: No edema.     Left lower leg: No edema.  Lymphadenopathy:     Cervical: No cervical adenopathy.  Skin:    General: Skin is warm.     Findings: No rash.  Neurological:     General: No focal deficit present.     Mental Status: She is alert and oriented to person, place, and time.  Psychiatric:        Mood and Affect: Mood  normal.        Behavior: Behavior normal.           Assessment & Plan:

## 2020-10-19 NOTE — Assessment & Plan Note (Signed)
Quiet on probiotic, vitamin C and cranberry

## 2020-10-19 NOTE — Assessment & Plan Note (Signed)
BP Readings from Last 3 Encounters:  10/19/20 136/88  12/27/19 (!) 152/77  11/30/19 (!) 173/99   Controlled on the metoprolol

## 2020-10-19 NOTE — Assessment & Plan Note (Addendum)
Seems healthy Discussed adding resistance exercise Urged to stop vaping again Strongly against COVID or flu vaccines Td due next year Doesn't want shingrix Colon due again 2031 Mammogram yearly with gyn Keeps up with paps

## 2020-10-19 NOTE — Assessment & Plan Note (Signed)
Doing well with primary care

## 2020-10-28 ENCOUNTER — Other Ambulatory Visit: Payer: Self-pay | Admitting: Internal Medicine

## 2020-11-06 DIAGNOSIS — Z881 Allergy status to other antibiotic agents status: Secondary | ICD-10-CM | POA: Diagnosis not present

## 2020-11-06 DIAGNOSIS — R69 Illness, unspecified: Secondary | ICD-10-CM | POA: Diagnosis not present

## 2020-11-06 DIAGNOSIS — Z833 Family history of diabetes mellitus: Secondary | ICD-10-CM | POA: Diagnosis not present

## 2020-11-06 DIAGNOSIS — Z8249 Family history of ischemic heart disease and other diseases of the circulatory system: Secondary | ICD-10-CM | POA: Diagnosis not present

## 2020-11-06 DIAGNOSIS — E785 Hyperlipidemia, unspecified: Secondary | ICD-10-CM | POA: Diagnosis not present

## 2020-11-06 DIAGNOSIS — Z720 Tobacco use: Secondary | ICD-10-CM | POA: Diagnosis not present

## 2020-11-06 DIAGNOSIS — I10 Essential (primary) hypertension: Secondary | ICD-10-CM | POA: Diagnosis not present

## 2020-11-29 ENCOUNTER — Ambulatory Visit: Payer: Self-pay | Admitting: Urology

## 2020-11-29 NOTE — Progress Notes (Signed)
11/30/2020 12:59 PM   [name] Apr 07, 1960 [id number]  Referring provider: Karie Schwalbe, MD [address] [place],  [place] [address]   Chief Complaint  Patient presents with   Follow-up    Urological history: 1. High risk hematuria -smoker -CT Urogram performed on 08/07/2015 noted no findings identified to explain patient's hematuria.  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  Aortic atherosclerosis.  Cystocele.  Lumbar spondylosis. -Cystoscopy with Dr. Ronne Binning performed on 08/14/2015 was normal.  -No reports of gross heme -UA negative for micro heme  2.rUTI's  -contributing factors of age and vaginal atrophy -documented positive urine cultures over the last year  None   Chief Complaint  Patient presents with   Follow-up    HPI: [name] is a 60 y.o. female who presents today for one year follow up.   Her only urinary complaint is that sometimes she experiences postvoid dribbling that is attributed to her cystocele.  Patient denies any modifying or aggravating factors.  Patient denies any gross hematuria, dysuria or suprapubic/flank pain.  Patient denies any fevers, chills, nausea or vomiting.    UA negative for micro heme.  No issues with rUTI's.  PMH: Past Medical History:  Diagnosis Date   Allergic rhinitis due to pollen    Epistaxis, recurrent    Hypercholesteremia    Hypertension    Recurrent UTI    Tachycardia    likely brief SVT    Surgical History: Past Surgical History:  Procedure Laterality Date   COLONOSCOPY     at age 32   LAPAROSCOPIC APPENDECTOMY N/A 06/30/2013   Procedure: APPENDECTOMY LAPAROSCOPIC;  Surgeon: Shelly Rubenstein, MD;  Location: [facility] OR;  Service: General;  Laterality: N/A;    Home Medications:  Allergies as of 11/30/2020       Reactions   Cephalexin Hives, Rash        Medication List        Accurate as of November 30, 2020 11:59 PM. If you have any questions, ask your nurse or  doctor.          aspirin EC 81 MG tablet Take 81 mg by mouth daily.   atorvastatin 10 MG tablet Commonly known as: LIPITOR TAKE 1 TABLET BY MOUTH EVERY DAY   CALCIUM-VITAMIN D PO Take 1 tablet by mouth daily.   CRANBERRY EXTRACT PO Take 1-2 tablets by mouth daily.   MAGNESIUM PO Take 400 mg by mouth daily.   metoprolol succinate 50 MG 24 hr tablet Commonly known as: TOPROL-XL TAKE 1 TABLET (50 MG TOTAL) BY MOUTH DAILY. TAKE WITH OR IMMEDIATELY FOLLOWING A MEAL.   multivitamin with minerals Tabs tablet Take 1 tablet by mouth daily.   PROBIOTIC PO Take by mouth.   VITAMIN B COMPLEX PO Take 1 tablet by mouth daily.   VITAMIN C PO Take 500 Units by mouth daily.        Allergies:  Allergies  Allergen Reactions   Cephalexin Hives and Rash    Family History: Family History  Problem Relation Age of Onset   Heart disease Mother    Diabetes Mother    Arthritis Father    Hyperlipidemia Sister    Obesity Brother    Cancer Maternal Grandmother        melanoma   Heart disease Paternal Grandfather    Kidney disease Neg Hx    Kidney cancer Neg Hx    Bladder Cancer Neg Hx    Colon  cancer Neg Hx    Colon polyps Neg Hx    Stomach cancer Neg Hx    Esophageal cancer Neg Hx     Social History:  reports that she has been smoking e-cigarettes and cigarettes. She has been smoking an average of 1 pack per day. She has never used smokeless tobacco. She reports current alcohol use. She reports that she does not use drugs.  ROS: For pertinent review of systems please refer to history of present illness  Physical Exam: BP (!) 170/89   Pulse 83   Ht 5\' 3"  (1.6 m)   Wt 157 lb (71.2 kg)   BMI 27.81 kg/m   Constitutional:  Well nourished. Alert and oriented, No acute distress. HEENT: [place] AT, mask in place.  Trachea midline Cardiovascular: No clubbing, cyanosis, or edema. Respiratory: Normal respiratory effort, no increased work of breathing. Neurologic: Grossly  intact, no focal deficits, moving all 4 extremities. Psychiatric: Normal mood and affect.    Laboratory Data: Lab Results  Component Value Date   WBC 10.0 10/19/2020   HGB 14.5 10/19/2020   HCT 42.6 10/19/2020   MCV 93.6 10/19/2020   PLT 272.0 10/19/2020    Lab Results  Component Value Date   CREATININE 0.71 10/19/2020      Component Value Date/Time   CHOL 218 (H) 10/19/2020 0858   HDL 48.20 10/19/2020 0858   CHOLHDL 5 10/19/2020 0858   VLDL 79.4 (H) 10/19/2020 0858   LDLCALC 84 09/05/2014 1304    Lab Results  Component Value Date   AST 19 10/19/2020   Lab Results  Component Value Date   ALT 19 10/19/2020   Results for orders placed or performed in visit on 11/30/20  Microscopic Examination   Urine  Result Value Ref Range   WBC, UA 0-5 0 - 5 /hpf   RBC None seen 0 - 2 /hpf   Epithelial Cells (non renal) 0-10 0 - 10 /hpf   Bacteria, UA Many (A) None seen/Few  Urinalysis, Complete  Result Value Ref Range   Specific Gravity, UA 1.020 1.005 - 1.030   pH, UA 7.5 5.0 - 7.5   Color, UA Yellow Yellow   Appearance Ur Clear Clear   Leukocytes,UA Negative Negative   Protein,UA Negative Negative/Trace   Glucose, UA Negative Negative   Ketones, UA Negative Negative   RBC, UA Negative Negative   Bilirubin, UA Negative Negative   Urobilinogen, Ur 0.2 0.2 - 1.0 mg/dL   Nitrite, UA Positive (A) Negative   Microscopic Examination See below:    Urinalysis Component     Latest Ref Rng & Units 11/30/2020  Specific Gravity, UA     1.005 - 1.030 1.020  pH, UA     5.0 - 7.5 7.5  Color, UA     Yellow Yellow  Appearance Ur     Clear Clear  Leukocytes,UA     Negative Negative  Protein,UA     Negative/Trace Negative  Glucose, UA     Negative Negative  Ketones, UA     Negative Negative  RBC, UA     Negative Negative  Bilirubin, UA     Negative Negative  Urobilinogen, Ur     0.2 - 1.0 mg/dL 0.2  Nitrite, UA     Negative Positive (A)  Microscopic Examination       See below:   Component     Latest Ref Rng & Units 11/30/2020  WBC, UA     0 - 5 /hpf  0-5  RBC     0 - 2 /hpf None seen  Epithelial Cells (non renal)     0 - 10 /hpf 0-10  Bacteria, UA     None seen/Few Many (A)  I have reviewed the labs    Assessment & Plan:    1. High risk hematuria:    -completed hematuria work up with CT Urogram and cystoscopy in 2017- no GU malignancies were found -UA today negative for micro heme -No reports of gross hematuria  2. rUTI's -No issues since discontinuing Mountain Dew consumption                                      Return in about 1 year (around 11/30/2021) for UA and recheck .  These notes generated with voice recognition software. I apologize for typographical errors.  Michiel Cowboy, PA-C  [facility] [address] [place], [place] [address] [phone]

## 2020-11-30 ENCOUNTER — Encounter: Payer: Self-pay | Admitting: Urology

## 2020-11-30 ENCOUNTER — Ambulatory Visit (INDEPENDENT_AMBULATORY_CARE_PROVIDER_SITE_OTHER): Payer: 59 | Admitting: Urology

## 2020-11-30 ENCOUNTER — Other Ambulatory Visit: Payer: Self-pay

## 2020-11-30 VITALS — BP 170/89 | HR 83 | Ht 63.0 in | Wt 157.0 lb

## 2020-11-30 DIAGNOSIS — N390 Urinary tract infection, site not specified: Secondary | ICD-10-CM | POA: Diagnosis not present

## 2020-11-30 DIAGNOSIS — R319 Hematuria, unspecified: Secondary | ICD-10-CM

## 2020-11-30 LAB — MICROSCOPIC EXAMINATION: RBC, Urine: NONE SEEN /hpf (ref 0–2)

## 2020-11-30 LAB — URINALYSIS, COMPLETE
Bilirubin, UA: NEGATIVE
Glucose, UA: NEGATIVE
Ketones, UA: NEGATIVE
Leukocytes,UA: NEGATIVE
Nitrite, UA: POSITIVE — AB
Protein,UA: NEGATIVE
RBC, UA: NEGATIVE
Specific Gravity, UA: 1.02 (ref 1.005–1.030)
Urobilinogen, Ur: 0.2 mg/dL (ref 0.2–1.0)
pH, UA: 7.5 (ref 5.0–7.5)

## 2020-12-19 DIAGNOSIS — Z01419 Encounter for gynecological examination (general) (routine) without abnormal findings: Secondary | ICD-10-CM | POA: Diagnosis not present

## 2020-12-19 DIAGNOSIS — Z1231 Encounter for screening mammogram for malignant neoplasm of breast: Secondary | ICD-10-CM | POA: Diagnosis not present

## 2021-10-20 ENCOUNTER — Other Ambulatory Visit: Payer: Self-pay | Admitting: Internal Medicine

## 2021-10-29 ENCOUNTER — Ambulatory Visit (INDEPENDENT_AMBULATORY_CARE_PROVIDER_SITE_OTHER): Payer: 59 | Admitting: Internal Medicine

## 2021-10-29 ENCOUNTER — Encounter: Payer: Self-pay | Admitting: Internal Medicine

## 2021-10-29 VITALS — BP 140/86 | HR 76 | Temp 97.4°F | Ht 62.5 in | Wt 158.0 lb

## 2021-10-29 DIAGNOSIS — Z23 Encounter for immunization: Secondary | ICD-10-CM

## 2021-10-29 DIAGNOSIS — Z0000 Encounter for general adult medical examination without abnormal findings: Secondary | ICD-10-CM | POA: Diagnosis not present

## 2021-10-29 DIAGNOSIS — E7800 Pure hypercholesterolemia, unspecified: Secondary | ICD-10-CM

## 2021-10-29 DIAGNOSIS — I10 Essential (primary) hypertension: Secondary | ICD-10-CM | POA: Diagnosis not present

## 2021-10-29 DIAGNOSIS — N390 Urinary tract infection, site not specified: Secondary | ICD-10-CM

## 2021-10-29 LAB — COMPREHENSIVE METABOLIC PANEL
ALT: 18 U/L (ref 0–35)
AST: 20 U/L (ref 0–37)
Albumin: 4.6 g/dL (ref 3.5–5.2)
Alkaline Phosphatase: 104 U/L (ref 39–117)
BUN: 12 mg/dL (ref 6–23)
CO2: 26 mEq/L (ref 19–32)
Calcium: 9.7 mg/dL (ref 8.4–10.5)
Chloride: 102 mEq/L (ref 96–112)
Creatinine, Ser: 0.64 mg/dL (ref 0.40–1.20)
GFR: 94.44 mL/min (ref >60.00)
Glucose, Bld: 87 mg/dL (ref 70–99)
Potassium: 3.9 mEq/L (ref 3.5–5.1)
Sodium: 138 mEq/L (ref 135–145)
Total Bilirubin: 0.6 mg/dL (ref 0.2–1.2)
Total Protein: 7.4 g/dL (ref 6.0–8.3)

## 2021-10-29 LAB — LIPID PANEL
Cholesterol: 195 mg/dL (ref 0–200)
HDL: 49.9 mg/dL (ref >39.00)
NonHDL: 145.03
Total CHOL/HDL Ratio: 4
Triglycerides: 330 mg/dL — ABNORMAL HIGH (ref 0.0–149.0)
VLDL: 66 mg/dL — ABNORMAL HIGH (ref 0.0–40.0)

## 2021-10-29 LAB — CBC
HCT: 43.3 % (ref 36.0–46.0)
Hemoglobin: 14.5 g/dL (ref 12.0–15.0)
MCHC: 33.5 g/dL (ref 30.0–36.0)
MCV: 95.3 fl (ref 78.0–100.0)
Platelets: 277 10*3/uL (ref 150.0–400.0)
RBC: 4.55 Mil/uL (ref 3.87–5.11)
RDW: 13.3 % (ref 11.5–15.5)
WBC: 8.9 10*3/uL (ref 4.0–10.5)

## 2021-10-29 LAB — LDL CHOLESTEROL, DIRECT: Direct LDL: 100 mg/dL

## 2021-10-29 NOTE — Assessment & Plan Note (Addendum)
[name] Urged her to stop vaping--or at least cut down Discussed cutting aspirin for primary prevention--go down to every other day Colonoscopy in 2031 Mammogram yearly at Gyn Pap due next year Discussed exercise Td booster and shingrix today

## 2021-10-29 NOTE — Assessment & Plan Note (Signed)
No recent problems  °

## 2021-10-29 NOTE — Progress Notes (Signed)
Subjective:    Patient ID: [name], female    DOB: 1960-10-29, 61 y.o.   MRN: [id number]  HPI Here for physical  Enjoying being a grandmother---watches her 3 days per week This is great Tries to walk regularly  No new health concerns Will go back to urologist--but no recent UTIs Thinks giving up [facility] helped (as well as cranberry, Vit C and probiotic)  Still vaping Not around granddaughter Counseled  Stressed with dad having health issues---now in [place] Brother died this spring--?heart  Current Outpatient Medications on File Prior to Visit  Medication Sig Dispense Refill   Ascorbic Acid (VITAMIN C PO) Take 500 Units by mouth daily.      aspirin EC 81 MG tablet Take 81 mg by mouth daily.     atorvastatin (LIPITOR) 10 MG tablet TAKE 1 TABLET BY MOUTH EVERY DAY 90 tablet 0   B Complex Vitamins (VITAMIN B COMPLEX PO) Take 1 tablet by mouth daily.     CALCIUM-VITAMIN D PO Take 1 tablet by mouth daily.     CRANBERRY EXTRACT PO Take 1-2 tablets by mouth daily.     MAGNESIUM PO Take 400 mg by mouth daily.      metoprolol succinate (TOPROL-XL) 50 MG 24 hr tablet TAKE 1 TABLET BY MOUTH DAILY. TAKE WITH OR IMMEDIATELY FOLLOWING A MEAL. 90 tablet 0   Multiple Vitamin (MULTIVITAMIN WITH MINERALS) TABS tablet Take 1 tablet by mouth daily.     Probiotic Product (PROBIOTIC PO) Take by mouth.     No current facility-administered medications on file prior to visit.    Allergies  Allergen Reactions   Cephalexin Hives and Rash    Past Medical History:  Diagnosis Date   Allergic rhinitis due to pollen    Epistaxis, recurrent    Hypercholesteremia    Hypertension    Recurrent UTI    Tachycardia    likely brief SVT    Past Surgical History:  Procedure Laterality Date   COLONOSCOPY     at age 56   LAPAROSCOPIC APPENDECTOMY N/A 06/30/2013   Procedure: APPENDECTOMY LAPAROSCOPIC;  Surgeon: Shelly Rubenstein, MD;  Location: [facility] OR;  Service: General;  Laterality: N/A;     Family History  Problem Relation Age of Onset   Heart disease Mother    Diabetes Mother    Arthritis Father    Hyperlipidemia Sister    Obesity Brother    Heart disease Brother    Cancer Maternal Grandmother        melanoma   Heart disease Paternal Grandfather    Kidney disease Neg Hx    Kidney cancer Neg Hx    Bladder Cancer Neg Hx    Colon cancer Neg Hx    Colon polyps Neg Hx    Stomach cancer Neg Hx    Esophageal cancer Neg Hx     Social History   Socioeconomic History   Marital status: Widowed    Spouse name: Not on file   Number of children: 2   Years of education: Not on file   Highest education level: Not on file  Occupational History   Occupation: housewife  Tobacco Use   Smoking status: Every Day    Packs/day: 1.00    Types: E-cigarettes, Cigarettes    Passive exposure: Never   Smokeless tobacco: Never  Vaping Use   Vaping Use: Some days  Substance and Sexual Activity   Alcohol use: Yes    Alcohol/week: 0.0 standard drinks of  alcohol    Comment: rare alcohol   Drug use: No   Sexual activity: Not on file  Other Topics Concern   Not on file  Social History Narrative   2 daughters   Social Determinants of Health   Financial Resource Strain: Not on file  Food Insecurity: Not on file  Transportation Needs: Not on file  Physical Activity: Not on file  Stress: Not on file  Social Connections: Not on file  Intimate Partner Violence: Not on file   Review of Systems  Constitutional:  Negative for fatigue and unexpected weight change.       Wears seat belt  HENT:  Negative for dental problem and tinnitus.        Itching in left ear at times--uses cream Some hearing loss Keeps up with dentist  Eyes:  Negative for visual disturbance.       No diplopia or unilateral vision loss Due for eye exam  Respiratory:  Negative for cough, chest tightness and shortness of breath.   Cardiovascular:  Negative for chest pain, palpitations and leg swelling.   Gastrointestinal:  Negative for blood in stool and constipation.       No heartburn  Endocrine: Negative for polydipsia and polyuria.  Genitourinary:  Negative for dyspareunia, dysuria and hematuria.  Musculoskeletal:  Negative for arthralgias, back pain and joint swelling.  Skin:  Negative for rash.       No suspicious lesions--does use sunscreen  Allergic/Immunologic: Positive for environmental allergies. Negative for immunocompromised state.       Rarely uses OTC  Neurological:  Negative for dizziness, syncope, light-headedness and headaches.  Hematological:  Negative for adenopathy. Does not bruise/bleed easily.  Psychiatric/Behavioral:  Negative for dysphoric mood and sleep disturbance. The patient is not nervous/anxious.        Objective:   Physical Exam Constitutional:      Appearance: Normal appearance.  HENT:     Mouth/Throat:     Pharynx: No oropharyngeal exudate or posterior oropharyngeal erythema.  Eyes:     Conjunctiva/sclera: Conjunctivae normal.     Pupils: Pupils are equal, round, and reactive to light.  Cardiovascular:     Rate and Rhythm: Normal rate and regular rhythm.     Pulses: Normal pulses.     Heart sounds: No murmur heard.    No gallop.  Pulmonary:     Effort: Pulmonary effort is normal.     Breath sounds: Normal breath sounds. No wheezing or rales.  Abdominal:     Palpations: Abdomen is soft.     Tenderness: There is no abdominal tenderness.  Musculoskeletal:     Cervical back: Neck supple.     Right lower leg: No edema.     Left lower leg: No edema.  Lymphadenopathy:     Cervical: No cervical adenopathy.  Skin:    Findings: No lesion or rash.  Neurological:     General: No focal deficit present.     Mental Status: She is alert and oriented to person, place, and time.  Psychiatric:        Mood and Affect: Mood normal.        Behavior: Behavior normal.            Assessment & Plan:

## 2021-10-29 NOTE — Assessment & Plan Note (Signed)
No problems with primary prevention--atorvastatin 10

## 2021-10-29 NOTE — Assessment & Plan Note (Signed)
BP Readings from Last 3 Encounters:  10/29/21 (!) 140/86  11/30/20 (!) 170/89  10/19/20 136/88   Reasonable control on metoprolol 50

## 2021-10-29 NOTE — Addendum Note (Signed)
Addended by: Eual Fines on: 10/29/2021 09:40 AM   Modules accepted: Orders

## 2021-11-04 ENCOUNTER — Telehealth: Payer: Self-pay | Admitting: Internal Medicine

## 2021-11-04 NOTE — Telephone Encounter (Signed)
MRN [id number] Would like Nurse appt for 2nd shingle inj 10/20 at 9AM  Please flag for scheduling once nurse visits for October have been opened

## 2021-12-02 NOTE — Progress Notes (Unsigned)
12/03/2021 10:59 AM   [name] 12/01/1960 [id number]  Referring provider: Venia Carbon, MD [address] [place],  [place] [address]   Urological history: 1. High risk hematuria -smoker -CT Urogram performed on 08/07/2015 noted no findings identified to explain patient's hematuria.  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  Aortic atherosclerosis.  Cystocele.  Lumbar spondylosis. -Cystoscopy with Dr. Alyson Ingles performed on 08/14/2015 was normal.  -No reports of gross heme -UA ***  2.rUTI's  -contributing factors of age and vaginal atrophy -documented positive urine cultures over the last year  None  -cranberry, Vitamin C and probiotics  3. Cystocele -conservative management   No chief complaint on file.   HPI: [name] is a 61 y.o. female who presents today for one year follow up.    PMH: Past Medical History:  Diagnosis Date   Allergic rhinitis due to pollen    Epistaxis, recurrent    Hypercholesteremia    Hypertension    Recurrent UTI    Tachycardia    likely brief SVT    Surgical History: Past Surgical History:  Procedure Laterality Date   COLONOSCOPY     at age 71   LAPAROSCOPIC APPENDECTOMY N/A 06/30/2013   Procedure: APPENDECTOMY LAPAROSCOPIC;  Surgeon: Harl Bowie, MD;  Location: [facility];  Service: General;  Laterality: N/A;    Home Medications:  Allergies as of 12/03/2021       Reactions   Cephalexin Hives, Rash        Medication List        Accurate as of December 02, 2021 10:59 AM. If you have any questions, ask your nurse or doctor.          aspirin EC 81 MG tablet Take 81 mg by mouth daily.   atorvastatin 10 MG tablet Commonly known as: LIPITOR TAKE 1 TABLET BY MOUTH EVERY DAY   CALCIUM-VITAMIN D PO Take 1 tablet by mouth daily.   CRANBERRY EXTRACT PO Take 1-2 tablets by mouth daily.   MAGNESIUM PO Take 400 mg by mouth daily.   metoprolol succinate 50 MG 24 hr tablet Commonly  known as: TOPROL-XL TAKE 1 TABLET BY MOUTH DAILY. TAKE WITH OR IMMEDIATELY FOLLOWING A MEAL.   multivitamin with minerals Tabs tablet Take 1 tablet by mouth daily.   PROBIOTIC PO Take by mouth.   VITAMIN B COMPLEX PO Take 1 tablet by mouth daily.   VITAMIN C PO Take 500 Units by mouth daily.        Allergies:  Allergies  Allergen Reactions   Cephalexin Hives and Rash    Family History: Family History  Problem Relation Age of Onset   Heart disease Mother    Diabetes Mother    Arthritis Father    Hyperlipidemia Sister    Obesity Brother    Heart disease Brother    Cancer Maternal Grandmother        melanoma   Heart disease Paternal Grandfather    Kidney disease Neg Hx    Kidney cancer Neg Hx    Bladder Cancer Neg Hx    Colon cancer Neg Hx    Colon polyps Neg Hx    Stomach cancer Neg Hx    Esophageal cancer Neg Hx     Social History:  reports that she has been smoking e-cigarettes and cigarettes. She has been smoking an average of 1 pack per day. She has never been exposed to tobacco smoke. She has never used  smokeless tobacco. She reports current alcohol use. She reports that she does not use drugs.  ROS: For pertinent review of systems please refer to history of present illness  Physical Exam: There were no vitals taken for this visit.  Constitutional:  Well nourished. Alert and oriented, No acute distress. HEENT: [place] AT, moist mucus membranes.  Trachea midline, no masses. Cardiovascular: No clubbing, cyanosis, or edema. Respiratory: Normal respiratory effort, no increased work of breathing. GI: Abdomen is soft, non tender, non distended, no abdominal masses. Liver and spleen not palpable.  No hernias appreciated.  Stool sample for occult testing is not indicated.   GU: No CVA tenderness.  No bladder fullness or masses.  *** external genitalia, *** pubic hair distribution, no lesions.  Normal urethral meatus, no lesions, no prolapse, no discharge.   No urethral  masses, tenderness and/or tenderness. No bladder fullness, tenderness or masses. *** vagina mucosa, *** estrogen effect, no discharge, no lesions, *** pelvic support, *** cystocele and *** rectocele noted.  No cervical motion tenderness.  Uterus is freely mobile and non-fixed.  No adnexal/parametria masses or tenderness noted.  Anus and perineum are without rashes or lesions.   ***  Skin: No rashes, bruises or suspicious lesions. Lymph: No cervical or inguinal adenopathy. Neurologic: Grossly intact, no focal deficits, moving all 4 extremities. Psychiatric: Normal mood and affect.    Laboratory Data: Lab Results  Component Value Date   WBC 8.9 10/29/2021   HGB 14.5 10/29/2021   HCT 43.3 10/29/2021   MCV 95.3 10/29/2021   PLT 277.0 10/29/2021    Lab Results  Component Value Date   CREATININE 0.64 10/29/2021      Component Value Date/Time   CHOL 195 10/29/2021 0925   HDL 49.90 10/29/2021 0925   CHOLHDL 4 10/29/2021 0925   VLDL 66.0 (H) 10/29/2021 0925   LDLCALC 84 09/05/2014 1304    Lab Results  Component Value Date   AST 20 10/29/2021   Lab Results  Component Value Date   ALT 18 10/29/2021   Results for orders placed or performed in visit on 10/29/21  Comprehensive metabolic panel  Result Value Ref Range   Sodium 138 135 - 145 mEq/L   Potassium 3.9 3.5 - 5.1 mEq/L   Chloride 102 96 - 112 mEq/L   CO2 26 19 - 32 mEq/L   Glucose, Bld 87 70 - 99 mg/dL   BUN 12 6 - 23 mg/dL   Creatinine, Ser 9.67 0.40 - 1.20 mg/dL   Total Bilirubin 0.6 0.2 - 1.2 mg/dL   Alkaline Phosphatase 104 39 - 117 U/L   AST 20 0 - 37 U/L   ALT 18 0 - 35 U/L   Total Protein 7.4 6.0 - 8.3 g/dL   Albumin 4.6 3.5 - 5.2 g/dL   GFR 89.38 >10.17 mL/min   Calcium 9.7 8.4 - 10.5 mg/dL  Lipid panel  Result Value Ref Range   Cholesterol 195 0 - 200 mg/dL   Triglycerides 510.2 (H) 0.0 - 149.0 mg/dL   HDL 58.52 >77.82 mg/dL   VLDL 42.3 (H) 0.0 - 53.6 mg/dL   Total CHOL/HDL Ratio 4    NonHDL 145.03    CBC  Result Value Ref Range   WBC 8.9 4.0 - 10.5 K/uL   RBC 4.55 3.87 - 5.11 Mil/uL   Platelets 277.0 150.0 - 400.0 K/uL   Hemoglobin 14.5 12.0 - 15.0 g/dL   HCT 14.4 31.5 - 40.0 %   MCV 95.3 78.0 - 100.0 fl  MCHC 33.5 30.0 - 36.0 g/dL   RDW 15.8 30.9 - 40.7 %  LDL cholesterol, direct  Result Value Ref Range   Direct LDL 100.0 mg/dL   Urinalysis *** I have reviewed the labs    Assessment & Plan:    1. High risk hematuria:    -smoker -completed hematuria work up with CT Urogram and cystoscopy in 2017- no GU malignancies were found -No reports of gross hematuria -UA ***  2. rUTI's -No issues since discontinuing Mountain Dew consumption           3. Cystocele                             No follow-ups on file.  These notes generated with voice recognition software. I apologize for typographical errors.  Cloretta Ned  [facility] [address] [place], [place] [address] [phone]

## 2021-12-03 ENCOUNTER — Ambulatory Visit (INDEPENDENT_AMBULATORY_CARE_PROVIDER_SITE_OTHER): Payer: 59 | Admitting: Urology

## 2021-12-03 ENCOUNTER — Encounter: Payer: Self-pay | Admitting: Urology

## 2021-12-03 VITALS — BP 153/77 | HR 84 | Ht 63.0 in | Wt 155.0 lb

## 2021-12-03 DIAGNOSIS — N8111 Cystocele, midline: Secondary | ICD-10-CM

## 2021-12-03 DIAGNOSIS — R319 Hematuria, unspecified: Secondary | ICD-10-CM | POA: Diagnosis not present

## 2021-12-03 DIAGNOSIS — N390 Urinary tract infection, site not specified: Secondary | ICD-10-CM

## 2021-12-03 LAB — URINALYSIS, COMPLETE
Bilirubin, UA: NEGATIVE
Glucose, UA: NEGATIVE
Ketones, UA: NEGATIVE
Leukocytes,UA: NEGATIVE
Nitrite, UA: POSITIVE — AB
Protein,UA: NEGATIVE
RBC, UA: NEGATIVE
Specific Gravity, UA: 1.01 (ref 1.005–1.030)
Urobilinogen, Ur: 0.2 mg/dL (ref 0.2–1.0)
pH, UA: 6 (ref 5.0–7.5)

## 2021-12-03 LAB — BLADDER SCAN AMB NON-IMAGING

## 2021-12-03 LAB — MICROSCOPIC EXAMINATION

## 2021-12-03 MED ORDER — NITROFURANTOIN MONOHYD MACRO 100 MG PO CAPS: 100.0000 mg | ORAL_CAPSULE | Freq: Two times a day (BID) | ORAL | 1 refills | Status: DC

## 2021-12-06 ENCOUNTER — Telehealth: Payer: Self-pay

## 2021-12-06 LAB — CULTURE, URINE COMPREHENSIVE

## 2021-12-06 NOTE — Telephone Encounter (Signed)
Notified pt as advised, pt expressed understanding.  ?

## 2021-12-06 NOTE — Telephone Encounter (Signed)
-----   Message from Nori Riis, PA-C sent at 12/06/2021  2:00 PM EDT ----- Please let Mrs. Devincent know that her urine culture was positive and the Macrobid is the correct antibiotic.  If she does not feel better after finishing her antibiotic, she needs to let us know.

## 2021-12-27 DIAGNOSIS — Z1231 Encounter for screening mammogram for malignant neoplasm of breast: Secondary | ICD-10-CM | POA: Diagnosis not present

## 2021-12-27 DIAGNOSIS — Z01419 Encounter for gynecological examination (general) (routine) without abnormal findings: Secondary | ICD-10-CM | POA: Diagnosis not present

## 2022-01-03 ENCOUNTER — Ambulatory Visit: Payer: No Typology Code available for payment source | Admitting: Internal Medicine

## 2022-01-03 ENCOUNTER — Ambulatory Visit (INDEPENDENT_AMBULATORY_CARE_PROVIDER_SITE_OTHER): Payer: 59

## 2022-01-03 DIAGNOSIS — Z23 Encounter for immunization: Secondary | ICD-10-CM

## 2022-01-03 NOTE — Progress Notes (Signed)
  Per orders of Dr. Letvak, injection of Shingrix #2 given by Lance Huar[place] V Taniesha Glanz. Patient tolerated injection well.  

## 2022-01-17 ENCOUNTER — Other Ambulatory Visit: Payer: Self-pay | Admitting: Internal Medicine

## 2022-05-05 ENCOUNTER — Encounter: Payer: Self-pay | Admitting: *Deleted

## 2022-05-26 ENCOUNTER — Ambulatory Visit: Payer: 59 | Admitting: Obstetrics and Gynecology

## 2022-05-26 ENCOUNTER — Encounter: Payer: Self-pay | Admitting: Obstetrics and Gynecology

## 2022-05-26 ENCOUNTER — Other Ambulatory Visit (HOSPITAL_COMMUNITY)
Admission: RE | Admit: 2022-05-26 | Discharge: 2022-05-26 | Disposition: A | Discharge status: Home / Self Care | Payer: 59 | Attending: Obstetrics and Gynecology | Admitting: Obstetrics and Gynecology

## 2022-05-26 VITALS — BP 159/87 | HR 81 | Ht 61.42 in | Wt 163.0 lb

## 2022-05-26 DIAGNOSIS — R350 Frequency of micturition: Secondary | ICD-10-CM

## 2022-05-26 DIAGNOSIS — N390 Urinary tract infection, site not specified: Secondary | ICD-10-CM | POA: Diagnosis not present

## 2022-05-26 DIAGNOSIS — N812 Incomplete uterovaginal prolapse: Secondary | ICD-10-CM | POA: Diagnosis not present

## 2022-05-26 DIAGNOSIS — N952 Postmenopausal atrophic vaginitis: Secondary | ICD-10-CM

## 2022-05-26 DIAGNOSIS — N8110 Cystocele, unspecified: Secondary | ICD-10-CM

## 2022-05-26 LAB — POCT URINALYSIS DIPSTICK
Bilirubin, UA: NEGATIVE
Blood, UA: NEGATIVE
Glucose, UA: NEGATIVE
Ketones, UA: NEGATIVE
Nitrite, UA: POSITIVE
Protein, UA: NEGATIVE
Spec Grav, UA: 1.025 (ref 1.010–1.025)
Urobilinogen, UA: 0.2 E.U./dL
pH, UA: 7 (ref 5.0–8.0)

## 2022-05-26 MED ORDER — ESTRADIOL 0.1 MG/GM VA CREA: TOPICAL_CREAM | VAGINAL | 11 refills | Status: DC

## 2022-05-26 NOTE — Patient Instructions (Signed)
You have a stage 2 (out of 4) prolapse.  We discussed the fact that it is not life threatening but there are several treatment options. For treatment of pelvic organ prolapse, we discussed options for management including expectant management, conservative management, and surgical management, such as Kegels, a pessary, pelvic floor physical therapy, and specific surgical procedures.     Start vaginal estrogen therapy nightly for two weeks then 2 times weekly at night for treatment of vaginal atrophy (dryness of the vaginal tissues) and prevention of urinary tract infections.  Please let us know if the prescription is too expensive and we can look for alternative options.

## 2022-05-26 NOTE — Progress Notes (Signed)
[facility] New Patient Evaluation and Consultation  Referring Provider: Jerelyn Charles, MD PCP: Venia Carbon, MD Date of Service: 05/26/2022  SUBJECTIVE Chief Complaint: New Patient (Initial Visit) [name] is a 62 y.o. female here for a consult for prolapse and recurrent UTI./)  History of Present Illness: [name] is a 62 y.o. White or Caucasian female seen in consultation at the request of Dr. Carlis Abbott for evaluation of prolapse.    Review of records significant for: Has cystocele and uterine prolapse which has been worsening. Not emptying bladder well. Has had a few UTIs.   Seen by [facility]- has history of hematuria and UTIs. CT Urogram and cystoscopy performed 2017 was normal.   Urinary Symptoms: Does not leak urine.   Day time voids 8- 10, does have some urgency.  Nocturia: 1-2 times per night to void. Voiding dysfunction: she empties her bladder well.  does not use a catheter to empty bladder.  When urinating, she feels a weak stream and dribbling after finishing Drinks: coffee, some water, sprite zero, tea per day  UTIs: 1 UTI's in the last year.  She is taking probiotic. Has not used vaginal estrogen.  Denies history of blood in urine and kidney or bladder stones  Pelvic Organ Prolapse Symptoms:                  She Admits to a feeling of a bulge the vaginal area. It has been present for 3 years.  She Admits to seeing a bulge.  This bulge is not bothersome, but can sometimes be annoying. If she lifts something heavy, or on her feet then it notices it more.  Has not had any prior treatment.   Bowel Symptom: Bowel movements: 1-2 time(s) per day Stool consistency: soft  Straining: no.  Splinting: no.  Incomplete evacuation: no.  She Denies accidental bowel leakage / fecal incontinence Bowel regimen: none  Sexual Function Sexually active: no.   Pelvic Pain Denies pelvic pain   Past Medical History:  Past Medical  History:  Diagnosis Date   Allergic rhinitis due to pollen    Epistaxis, recurrent    Hypercholesteremia    Hypertension    Recurrent UTI    Tachycardia    likely brief SVT     Past Surgical History:   Past Surgical History:  Procedure Laterality Date   COLONOSCOPY     at age 73   LAPAROSCOPIC APPENDECTOMY N/A 06/30/2013   Procedure: APPENDECTOMY LAPAROSCOPIC;  Surgeon: Harl Bowie, MD;  Location: [facility];  Service: General;  Laterality: N/A;     Past OB/GYN History: OB History  Gravida Para Term Preterm AB Living  '3 2 2   1 2  '$ SAB IAB Ectopic Multiple Live Births  1       2    # Outcome Date GA Lbr Len/2nd Weight Sex Delivery Anes PTL Lv  3 Term      Vag-Spont     2 Term      Vag-Spont     1 SAB             Menopausal: Denies vaginal bleeding since menopause Any history of abnormal pap smears: no.   Medications: She has a current medication list which includes the following prescription(s): estradiol, ascorbic acid, aspirin ec, atorvastatin, b complex vitamins, calcium-vitamin d, cranberry, magnesium, metoprolol succinate, multivitamin with minerals, and probiotic product.   Allergies: Patient is allergic to cephalexin.   Social History:  Social History   Tobacco Use   Smoking status: Every Day    Packs/day: 1.00    Types: E-cigarettes, Cigarettes    Passive exposure: Never   Smokeless tobacco: Never  Vaping Use   Vaping Use: Some days  Substance Use Topics   Alcohol use: Yes    Alcohol/week: 0.0 standard drinks of alcohol    Comment: rare alcohol   Drug use: No    Relationship status: widowed She lives with her daughter.   She is not employed. Regular exercise: No History of abuse: No  Family History:   Family History  Problem Relation Age of Onset   Heart disease Mother    Diabetes Mother    Arthritis Father    Hyperlipidemia Sister    Obesity Brother    Heart disease Brother    Cancer Maternal Grandmother        melanoma   Heart  disease Paternal Grandfather    Kidney disease Neg Hx    Kidney cancer Neg Hx    Bladder Cancer Neg Hx    Colon cancer Neg Hx    Colon polyps Neg Hx    Stomach cancer Neg Hx    Esophageal cancer Neg Hx      Review of Systems: Review of Systems  Constitutional:  Negative for fever, malaise/fatigue and weight loss.  Respiratory:  Negative for cough, shortness of breath and wheezing.   Cardiovascular:  Negative for chest pain, palpitations and leg swelling.  Gastrointestinal:  Negative for abdominal pain and blood in stool.  Genitourinary:  Negative for dysuria.  Musculoskeletal:  Negative for myalgias.  Skin:  Negative for rash.  Neurological:  Negative for dizziness and headaches.  Endo/Heme/Allergies:  Does not bruise/bleed easily.  Psychiatric/Behavioral:  Negative for depression. The patient is not nervous/anxious.      OBJECTIVE Physical Exam: Vitals:   05/26/22 0856 05/26/22 0935  BP: (!) 163/89 (!) 159/87  Pulse: 81   Weight: 163 lb (73.9 kg)   Height: 5' 1.42" (1.56 m)     Physical Exam Constitutional:      General: She is not in acute distress. Pulmonary:     Effort: Pulmonary effort is normal.  Abdominal:     General: There is no distension.     Palpations: Abdomen is soft.     Tenderness: There is no abdominal tenderness. There is no rebound.  Musculoskeletal:        General: No swelling. Normal range of motion.  Skin:    General: Skin is warm and dry.     Findings: No rash.  Neurological:     Mental Status: She is alert and oriented to person, place, and time.  Psychiatric:        Mood and Affect: Mood normal.        Behavior: Behavior normal.      GU / Detailed Urogynecologic Evaluation:  Pelvic Exam: Normal external female genitalia; Bartholin's and Skene's glands normal in appearance; urethral meatus normal in appearance, no urethral masses or discharge.   CST: negative  Speculum exam reveals normal vaginal mucosa with atrophy. Cervix normal  appearance. Uterus normal single, nontender. Adnexa no mass, fullness, tenderness.     Pelvic floor strength I/V  Pelvic floor musculature: Right levator non-tender, Right obturator non-tender, Left levator non-tender, Left obturator non-tender  POP-Q:   POP-Q  1  Aa   1                                           Ba  -5.5                                              C   5                                            Gh  5.5                                            Pb  8                                            tvl   -2                                            Ap  -2                                            Bp  -6.5                                              D      Rectal Exam:  Hemorrhoids present  Post-Void Residual (PVR) by Bladder Scan: In order to evaluate bladder emptying, we discussed obtaining a postvoid residual and she agreed to this procedure.  Procedure: The ultrasound unit was placed on the patient's abdomen in the suprapubic region after the patient had voided. A PVR of 65 ml was obtained by bladder scan.  Laboratory Results: POC urine: trace leukocytes, positive nitrites   ASSESSMENT AND PLAN Ms. Ludlum is a 62 y.o. with:  1. Prolapse of anterior vaginal wall   2. Uterovaginal prolapse, incomplete   3. Vaginal atrophy   4. Recurrent UTI   5. Urinary frequency    Stage II anterior, Stage I posterior, Stage I apical prolapse - For treatment of pelvic organ prolapse, we discussed options for management including expectant management, conservative management, and surgical management, such as Kegels, a pessary, pelvic floor physical therapy, and specific surgical procedures. - She is not interested in treatment at this time and prefers expectant management. She will return for evaluation if symptoms become more bothersome.   2. Vaginal atrophy/ UTI - has only had one UTI in last year.  -  Recommended starting 0.5g vaginal estrogen (estrace) twice a week - Urine with nitrites today, will send for culture  3. Urinary frequency -  discussed reducing caffeine and bladder irritants and drinking more water - Not bothersome to her at this time  Return as needed  Jaquita Folds, MD

## 2022-05-28 LAB — URINE CULTURE: Culture: >=100000 — AB

## 2022-05-28 MED ORDER — SULFAMETHOXAZOLE-TRIMETHOPRIM 800-160 MG PO TABS: 1.0000 | ORAL_TABLET | Freq: Two times a day (BID) | ORAL | 0 refills | Status: AC

## 2022-05-28 NOTE — Addendum Note (Signed)
Addended by: Jaquita Folds on: 05/28/2022 05:28 PM   Modules accepted: Orders

## 2022-05-29 NOTE — Progress Notes (Signed)
Patient has been notified

## 2022-10-15 ENCOUNTER — Encounter (INDEPENDENT_AMBULATORY_CARE_PROVIDER_SITE_OTHER): Payer: Self-pay

## 2022-10-31 ENCOUNTER — Ambulatory Visit (INDEPENDENT_AMBULATORY_CARE_PROVIDER_SITE_OTHER): Payer: 59 | Admitting: Internal Medicine

## 2022-10-31 ENCOUNTER — Encounter: Payer: Self-pay | Admitting: Internal Medicine

## 2022-10-31 VITALS — BP 132/86 | HR 71 | Temp 97.4°F | Ht 62.75 in | Wt 162.0 lb

## 2022-10-31 DIAGNOSIS — E7800 Pure hypercholesterolemia, unspecified: Secondary | ICD-10-CM | POA: Diagnosis not present

## 2022-10-31 DIAGNOSIS — I10 Essential (primary) hypertension: Secondary | ICD-10-CM | POA: Diagnosis not present

## 2022-10-31 DIAGNOSIS — Z0000 Encounter for general adult medical examination without abnormal findings: Secondary | ICD-10-CM | POA: Diagnosis not present

## 2022-10-31 LAB — COMPREHENSIVE METABOLIC PANEL
ALT: 19 U/L (ref 0–35)
AST: 21 U/L (ref 0–37)
Albumin: 4.7 g/dL (ref 3.5–5.2)
Alkaline Phosphatase: 100 U/L (ref 39–117)
BUN: 14 mg/dL (ref 6–23)
CO2: 27 mEq/L (ref 19–32)
Calcium: 10.1 mg/dL (ref 8.4–10.5)
Chloride: 100 mEq/L (ref 96–112)
Creatinine, Ser: 0.66 mg/dL (ref 0.40–1.20)
GFR: 93.08 mL/min (ref >60.00)
Glucose, Bld: 90 mg/dL (ref 70–99)
Potassium: 3.8 mEq/L (ref 3.5–5.1)
Sodium: 134 mEq/L — ABNORMAL LOW (ref 135–145)
Total Bilirubin: 0.5 mg/dL (ref 0.2–1.2)
Total Protein: 7.5 g/dL (ref 6.0–8.3)

## 2022-10-31 LAB — LIPID PANEL
Cholesterol: 210 mg/dL — ABNORMAL HIGH (ref 0–200)
HDL: 47 mg/dL (ref >39.00)
NonHDL: 162.58
Total CHOL/HDL Ratio: 4
Triglycerides: 332 mg/dL — ABNORMAL HIGH (ref 0.0–149.0)
VLDL: 66.4 mg/dL — ABNORMAL HIGH (ref 0.0–40.0)

## 2022-10-31 LAB — LDL CHOLESTEROL, DIRECT: Direct LDL: 125 mg/dL

## 2022-10-31 LAB — CBC
HCT: 45 % (ref 36.0–46.0)
Hemoglobin: 15 g/dL (ref 12.0–15.0)
MCHC: 33.3 g/dL (ref 30.0–36.0)
MCV: 94.5 fl (ref 78.0–100.0)
Platelets: 294 10*3/uL (ref 150.0–400.0)
RBC: 4.76 Mil/uL (ref 3.87–5.11)
RDW: 13.1 % (ref 11.5–15.5)
WBC: 7.7 10*3/uL (ref 4.0–10.5)

## 2022-10-31 NOTE — Assessment & Plan Note (Signed)
Healthy Discussed nicotine replacement for cigarette cessation Colon due 2031 Gets paps every 3 years at gyn Yearly mammogram Discussed exercise Prefers no COVID or flu vaccines--discussed Recommended RSV

## 2022-10-31 NOTE — Assessment & Plan Note (Signed)
No problems with primary prevention with atorvastatin 10

## 2022-10-31 NOTE — Progress Notes (Signed)
Subjective:    Patient ID: [name], female    DOB: [birth year], 62 y.o.   MRN: [id number]  HPI Here for physical  Doing okay Still watches grandchildren--baby also (splits with other grandmother)  Has gone back to cigarettes--instead of vaping May have stopped for 1 month--cold [place] Discussed options--nicotine replacement Some cough Gets DOE---if strenuous exercise. Does walk with grandkids  Current Outpatient Medications on File Prior to Visit  Medication Sig Dispense Refill   Ascorbic Acid (VITAMIN C PO) Take 500 Units by mouth daily.      aspirin EC 81 MG tablet Take 81 mg by mouth daily.     atorvastatin (LIPITOR) 10 MG tablet TAKE 1 TABLET BY MOUTH EVERY DAY 90 tablet 3   B Complex Vitamins (VITAMIN B COMPLEX PO) Take 1 tablet by mouth daily.     CALCIUM-VITAMIN D PO Take 1 tablet by mouth daily.     CRANBERRY EXTRACT PO Take 1-2 tablets by mouth daily.     estradiol (ESTRACE) 0.1 MG/GM vaginal cream Place 0.5g nightly twice a week 30 g 11   MAGNESIUM PO Take 400 mg by mouth daily.      metoprolol succinate (TOPROL-XL) 50 MG 24 hr tablet TAKE 1 TABLET BY MOUTH EVERY DAY WITH OR IMMEDIATELY FOLLOWING A MEAL 90 tablet 3   Multiple Vitamin (MULTIVITAMIN WITH MINERALS) TABS tablet Take 1 tablet by mouth daily.     Probiotic Product (PROBIOTIC PO) Take by mouth.     No current facility-administered medications on file prior to visit.    Allergies  Allergen Reactions   Cephalexin Hives and Rash    Past Medical History:  Diagnosis Date   Allergic rhinitis due to pollen    Epistaxis, recurrent    Hypercholesteremia    Hypertension    Recurrent UTI    Tachycardia    likely brief SVT    Past Surgical History:  Procedure Laterality Date   COLONOSCOPY     at age 39   LAPAROSCOPIC APPENDECTOMY N/A 06/30/2013   Procedure: APPENDECTOMY LAPAROSCOPIC;  Surgeon: Shelly Rubenstein, MD;  Location: [facility];  Service: General;  Laterality: N/A;    Family History   Problem Relation Age of Onset   Heart disease Mother    Diabetes Mother    Arthritis Father    Hyperlipidemia Sister    Obesity Brother    Heart disease Brother    Cancer Maternal Grandmother        melanoma   Heart disease Paternal Grandfather    Kidney disease Neg Hx    Kidney cancer Neg Hx    Bladder Cancer Neg Hx    Colon cancer Neg Hx    Colon polyps Neg Hx    Stomach cancer Neg Hx    Esophageal cancer Neg Hx     Social History   Socioeconomic History   Marital status: Widowed    Spouse name: Not on file   Number of children: 2   Years of education: Not on file   Highest education level: Not on file  Occupational History   Occupation: housewife  Tobacco Use   Smoking status: Every Day    Current packs/day: 1.00    Types: Cigarettes    Passive exposure: Never   Smokeless tobacco: Never  Vaping Use   Vaping status: Some Days  Substance and Sexual Activity   Alcohol use: Yes    Alcohol/week: 0.0 standard drinks of alcohol    Comment: rare alcohol  Drug use: No   Sexual activity: Not Currently  Other Topics Concern   Not on file  Social History Narrative   2 daughters   Social Determinants of Health   Financial Resource Strain: Not on file  Food Insecurity: Not on file  Transportation Needs: Not on file  Physical Activity: Not on file  Stress: Not on file  Social Connections: Not on file  Intimate Partner Violence: Not on file   Review of Systems  Constitutional:  Negative for fatigue.       Has gained a few pounds Wears seat belt  HENT:  Negative for dental problem, hearing loss and tinnitus.        Keeps up with dentist  Eyes:  Negative for visual disturbance.       No diplopia or unilateral vision loss  Respiratory:  Positive for cough and shortness of breath.   Cardiovascular:  Negative for chest pain, palpitations and leg swelling.  Gastrointestinal:  Negative for blood in stool and constipation.       No heartburn  Endocrine: Negative  for polydipsia and polyuria.  Genitourinary:  Negative for dyspareunia, dysuria and hematuria.  Musculoskeletal:  Negative for back pain and joint swelling.       Occ burning left hip pain--with walking (?bursa--discussed ice)  Skin:  Negative for rash.  Allergic/Immunologic: Positive for environmental allergies. Negative for immunocompromised state.       Fexofenadine prn  Neurological:  Negative for dizziness, syncope, light-headedness and headaches.  Hematological:  Negative for adenopathy. Does not bruise/bleed easily.  Psychiatric/Behavioral:  Negative for dysphoric mood and sleep disturbance. The patient is not nervous/anxious.        Objective:   Physical Exam Constitutional:      Appearance: Normal appearance.  HENT:     Mouth/Throat:     Pharynx: No oropharyngeal exudate or posterior oropharyngeal erythema.  Eyes:     Conjunctiva/sclera: Conjunctivae normal.     Pupils: Pupils are equal, round, and reactive to light.  Cardiovascular:     Rate and Rhythm: Normal rate and regular rhythm.     Pulses: Normal pulses.     Heart sounds: No murmur heard.    No gallop.  Pulmonary:     Effort: Pulmonary effort is normal.     Breath sounds: Normal breath sounds. No wheezing or rales.  Abdominal:     Palpations: Abdomen is soft.     Tenderness: There is no abdominal tenderness.  Musculoskeletal:     Cervical back: Neck supple.     Right lower leg: No edema.     Left lower leg: No edema.  Lymphadenopathy:     Cervical: No cervical adenopathy.  Skin:    Findings: No rash.  Neurological:     General: No focal deficit present.     Mental Status: She is alert and oriented to person, place, and time.  Psychiatric:        Mood and Affect: Mood normal.        Behavior: Behavior normal.            Assessment & Plan:

## 2022-10-31 NOTE — Assessment & Plan Note (Signed)
BP Readings from Last 3 Encounters:  10/31/22 132/86  05/26/22 (!) 159/87  12/03/21 (!) 153/77   Controlled with metoprolol 50 daily

## 2022-12-04 NOTE — Progress Notes (Unsigned)
12/05/2022 9:03 PM   [name] 04-Sep-1960 [id number]  Referring provider: Karie Schwalbe, MD [address] [place],  [place] [address]   Urological history: 1. High risk hematuria -smoker -CT Urogram performed on 08/07/2015 noted no findings identified to explain patient's hematuria.  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  Aortic atherosclerosis.  Cystocele.  Lumbar spondylosis. -Cystoscopy with Dr. Ronne Binning performed on 08/14/2015 negative   2.rUTI's  -contributing factors of age and vaginal atrophy -documented urine cultures over the last year  May 26, 2022 Klebsiella pneumoniae  December 03, 2021 Klebsiella pneumoniae  -cranberry, Vitamin C and probiotics  3. Cystocele -conservative management   Chief Complaint  Patient presents with   Hematuria   HPI: [name] is a 62 y.o. female who presents today for one year follow up.   Previous Records reviewed.   She was treated for UTI recently, but this was based more on a urinalysis/urine culture as she was not having symptoms.  She is having 8 or more daytime urinations, 1-2 episodes of nocturia with a strong urge to urinate.  She does not have any urinary leakage.  She does engage in toilet mapping.  She does not feel that she urinates excessively.  She does drink copious amounts of water.  She states that she only has strong urge after she goes more than 2 hours without voiding.  Patient denies any modifying or aggravating factors.  Patient denies any recent UTI's, gross hematuria, dysuria or suprapubic/flank pain.  Patient denies any fevers, chills, nausea or vomiting.    UA yellow slightly cloudy, specific gravity 1.015, pH 7.0, nitrate positive, trace leukocyte, 6-10 WBCs, 0-2 RBCs, 0-10 epithelial cells and many bacteria.   PMH: Past Medical History:  Diagnosis Date   Allergic rhinitis due to pollen    Epistaxis, recurrent    Hypercholesteremia    Hypertension    Recurrent  UTI    Tachycardia    likely brief SVT    Surgical History: Past Surgical History:  Procedure Laterality Date   COLONOSCOPY     at age 7   LAPAROSCOPIC APPENDECTOMY N/A 06/30/2013   Procedure: APPENDECTOMY LAPAROSCOPIC;  Surgeon: Shelly Rubenstein, MD;  Location: [facility] OR;  Service: General;  Laterality: N/A;    Home Medications:  Allergies as of 12/05/2022       Reactions   Cephalexin Hives, Rash        Medication List        Accurate as of December 05, 2022 11:59 PM. If you have any questions, ask your nurse or doctor.          aspirin EC 81 MG tablet Take 81 mg by mouth daily.   atorvastatin 10 MG tablet Commonly known as: LIPITOR TAKE 1 TABLET BY MOUTH EVERY DAY   CALCIUM-VITAMIN D PO Take 1 tablet by mouth daily.   CRANBERRY EXTRACT PO Take 1-2 tablets by mouth daily.   estradiol 0.1 MG/GM vaginal cream Commonly known as: ESTRACE Place 0.5g nightly twice a week   MAGNESIUM PO Take 400 mg by mouth daily.   metoprolol succinate 50 MG 24 hr tablet Commonly known as: TOPROL-XL TAKE 1 TABLET BY MOUTH EVERY DAY WITH OR IMMEDIATELY FOLLOWING A MEAL   multivitamin with minerals Tabs tablet Take 1 tablet by mouth daily.   PROBIOTIC PO Take by mouth.   VITAMIN B COMPLEX PO Take 1 tablet by mouth daily.   VITAMIN C PO Take 500 Units by  mouth daily.        Allergies:  Allergies  Allergen Reactions   Cephalexin Hives and Rash    Family History: Family History  Problem Relation Age of Onset   Heart disease Mother    Diabetes Mother    Arthritis Father    Hyperlipidemia Sister    Obesity Brother    Heart disease Brother    Cancer Maternal Grandmother        melanoma   Heart disease Paternal Grandfather    Kidney disease Neg Hx    Kidney cancer Neg Hx    Bladder Cancer Neg Hx    Colon cancer Neg Hx    Colon polyps Neg Hx    Stomach cancer Neg Hx    Esophageal cancer Neg Hx     Social History:  reports that she has been smoking  cigarettes. She has never been exposed to tobacco smoke. She has never used smokeless tobacco. She reports current alcohol use. She reports that she does not use drugs.  ROS: For pertinent review of systems please refer to history of present illness  Physical Exam: BP 129/71   Pulse 96   Ht 5\' 3"  (1.6 m)   Wt 160 lb (72.6 kg)   BMI 28.34 kg/m   Constitutional:  Well nourished. Alert and oriented, No acute distress. HEENT: [place] AT, moist mucus membranes.  Trachea midline Cardiovascular: No clubbing, cyanosis, or edema. Respiratory: Normal respiratory effort, no increased work of breathing. Neurologic: Grossly intact, no focal deficits, moving all 4 extremities. Psychiatric: Normal mood and affect.   Laboratory Data: Lab Results  Component Value Date   WBC 7.7 10/31/2022   HGB 15.0 10/31/2022   HCT 45.0 10/31/2022   MCV 94.5 10/31/2022   PLT 294.0 10/31/2022    Lab Results  Component Value Date   CREATININE 0.66 10/31/2022      Component Value Date/Time   CHOL 210 (H) 10/31/2022 0914   HDL 47.00 10/31/2022 0914   CHOLHDL 4 10/31/2022 0914   VLDL 66.4 (H) 10/31/2022 0914   LDLCALC 84 09/05/2014 1304    Lab Results  Component Value Date   AST 21 10/31/2022   Lab Results  Component Value Date   ALT 19 10/31/2022   Results for orders placed or performed in visit on 12/05/22  Microscopic Examination   Urine  Result Value Ref Range   WBC, UA 6-10 (A) 0 - 5 /hpf   RBC, Urine 0-2 0 - 2 /hpf   Epithelial Cells (non renal) 0-10 0 - 10 /hpf   Bacteria, UA Many (A) None seen/Few  Urinalysis, Complete  Result Value Ref Range   Specific Gravity, UA 1.015 1.005 - 1.030   pH, UA 7.0 5.0 - 7.5   Color, UA Yellow Yellow   Appearance Ur Hazy (A) Clear   Leukocytes,UA Trace (A) Negative   Protein,UA Negative Negative/Trace   Glucose, UA Negative Negative   Ketones, UA Negative Negative   RBC, UA Negative Negative   Bilirubin, UA Negative Negative   Urobilinogen, Ur 0.2  0.2 - 1.0 mg/dL   Nitrite, UA Positive (A) Negative   Microscopic Examination See below:   Bladder Scan (Post Void Residual) in office  Result Value Ref Range   Scan Result 87    Urinalysis See Epic and HPI I have reviewed the labs    Assessment & Plan:    1. High risk hematuria:    -smoker -completed hematuria work up with CT Urogram and cystoscopy  in 2017- no GU malignancies were found -No reports of gross hematuria -UA negative  2. rUTI's -Discussed colonization versus UTI and since she is asymptomatic at this visit, I will not prescribe antibiotics -I did send the urine for culture in case she develops symptoms of urinary tract infection in the next few days after this appointment  3. Cystocele                           -more dropped recently, but not bothersome   Return in about 1 year (around 12/05/2023) for OAB questionnaire, PVR and UA .  These notes generated with voice recognition software. I apologize for typographical errors.  Cloretta Ned  [facility] [address] [place], [place] [address] [phone]

## 2022-12-05 ENCOUNTER — Ambulatory Visit: Payer: 59 | Admitting: Urology

## 2022-12-05 ENCOUNTER — Encounter: Payer: Self-pay | Admitting: Urology

## 2022-12-05 VITALS — BP 129/71 | HR 96 | Ht 63.0 in | Wt 160.0 lb

## 2022-12-05 DIAGNOSIS — R351 Nocturia: Secondary | ICD-10-CM

## 2022-12-05 DIAGNOSIS — N8111 Cystocele, midline: Secondary | ICD-10-CM | POA: Diagnosis not present

## 2022-12-05 DIAGNOSIS — N390 Urinary tract infection, site not specified: Secondary | ICD-10-CM | POA: Diagnosis not present

## 2022-12-05 DIAGNOSIS — Z87440 Personal history of urinary (tract) infections: Secondary | ICD-10-CM | POA: Diagnosis not present

## 2022-12-05 DIAGNOSIS — R319 Hematuria, unspecified: Secondary | ICD-10-CM

## 2022-12-05 DIAGNOSIS — R350 Frequency of micturition: Secondary | ICD-10-CM | POA: Diagnosis not present

## 2022-12-05 DIAGNOSIS — R82998 Other abnormal findings in urine: Secondary | ICD-10-CM

## 2022-12-05 LAB — URINALYSIS, COMPLETE
Bilirubin, UA: NEGATIVE
Glucose, UA: NEGATIVE
Ketones, UA: NEGATIVE
Nitrite, UA: POSITIVE — AB
Protein,UA: NEGATIVE
RBC, UA: NEGATIVE
Specific Gravity, UA: 1.015 (ref 1.005–1.030)
Urobilinogen, Ur: 0.2 mg/dL (ref 0.2–1.0)
pH, UA: 7 (ref 5.0–7.5)

## 2022-12-05 LAB — MICROSCOPIC EXAMINATION

## 2022-12-05 LAB — BLADDER SCAN AMB NON-IMAGING: Scan Result: 87

## 2022-12-09 LAB — CULTURE, URINE COMPREHENSIVE

## 2023-01-04 ENCOUNTER — Other Ambulatory Visit: Payer: Self-pay | Admitting: Internal Medicine

## 2023-01-06 ENCOUNTER — Ambulatory Visit: Payer: 59 | Admitting: Urology

## 2023-01-06 ENCOUNTER — Encounter: Payer: Self-pay | Admitting: Urology

## 2023-01-06 VITALS — BP 130/68 | HR 72 | Ht 63.0 in | Wt 160.0 lb

## 2023-01-06 DIAGNOSIS — N390 Urinary tract infection, site not specified: Secondary | ICD-10-CM

## 2023-01-06 DIAGNOSIS — Z87440 Personal history of urinary (tract) infections: Secondary | ICD-10-CM | POA: Diagnosis not present

## 2023-01-06 DIAGNOSIS — R8271 Bacteriuria: Secondary | ICD-10-CM | POA: Diagnosis not present

## 2023-01-06 DIAGNOSIS — R319 Hematuria, unspecified: Secondary | ICD-10-CM

## 2023-01-06 DIAGNOSIS — N8111 Cystocele, midline: Secondary | ICD-10-CM

## 2023-01-06 LAB — URINALYSIS, COMPLETE
Bilirubin, UA: NEGATIVE
Glucose, UA: NEGATIVE
Ketones, UA: NEGATIVE
Leukocytes,UA: NEGATIVE
Nitrite, UA: POSITIVE — AB
Protein,UA: NEGATIVE
RBC, UA: NEGATIVE
Specific Gravity, UA: 1.02 (ref 1.005–1.030)
Urobilinogen, Ur: 0.2 mg/dL (ref 0.2–1.0)
pH, UA: 7 (ref 5.0–7.5)

## 2023-01-06 LAB — MICROSCOPIC EXAMINATION

## 2023-01-06 LAB — BLADDER SCAN AMB NON-IMAGING: Scan Result: 121

## 2023-01-06 MED ORDER — SULFAMETHOXAZOLE-TRIMETHOPRIM 800-160 MG PO TABS
1.0000 | ORAL_TABLET | Freq: Two times a day (BID) | ORAL | 0 refills | Status: DC
Start: 2023-01-06 — End: 2023-04-10

## 2023-01-06 NOTE — Progress Notes (Signed)
01/06/2023 10:33 AM   [name] 1960/05/03 [id number]  Referring provider: Karie Schwalbe, MD [address] [place],  [place] [address]   Urological history: 1. High risk hematuria -smoker -CT Urogram performed on 08/07/2015 noted no findings identified to explain patient's hematuria.  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  Aortic atherosclerosis.  Cystocele.  Lumbar spondylosis. -Cystoscopy with Dr. Ronne Binning performed on 08/14/2015 negative   2.rUTI's  -contributing factors of age and vaginal atrophy -documented urine cultures over the last year  May 26, 2022 Klebsiella pneumoniae  December 03, 2021 Klebsiella pneumoniae  -cranberry, Vitamin C and probiotics  3. Cystocele -conservative management   Chief Complaint  Patient presents with   Hematuria   HPI: [name] is a 62 y.o. female who presents today for slow urine stream, and pain in pelvic area when she stands up.  Previous Records reviewed.   Over the weekend she started develop left-sided back pain which she originally attributed to a muscle skeletal etiology, but then she started having suprapubic pain and pressure.  Patient denies any modifying or aggravating factors.  Patient denies any recent UTI's, gross hematuria, dysuria or flank pain.  Patient denies any fevers, chills, nausea or vomiting.    UA nitrate positive with bacteruria.  PVR 121 mL   PMH: Past Medical History:  Diagnosis Date   Allergic rhinitis due to pollen    Epistaxis, recurrent    Hypercholesteremia    Hypertension    Recurrent UTI    Tachycardia    likely brief SVT    Surgical History: Past Surgical History:  Procedure Laterality Date   COLONOSCOPY     at age 51   LAPAROSCOPIC APPENDECTOMY N/A 06/30/2013   Procedure: APPENDECTOMY LAPAROSCOPIC;  Surgeon: Shelly Rubenstein, MD;  Location: [facility] OR;  Service: General;  Laterality: N/A;    Home Medications:  Allergies as of 01/06/2023        Reactions   Cephalexin Hives, Rash        Medication List        Accurate as of January 06, 2023 10:33 AM. If you have any questions, ask your nurse or doctor.          aspirin EC 81 MG tablet Take 81 mg by mouth daily.   atorvastatin 10 MG tablet Commonly known as: LIPITOR TAKE 1 TABLET BY MOUTH EVERY DAY   CALCIUM-VITAMIN D PO Take 1 tablet by mouth daily.   CRANBERRY EXTRACT PO Take 1-2 tablets by mouth daily.   estradiol 0.1 MG/GM vaginal cream Commonly known as: ESTRACE Place 0.5g nightly twice a week   MAGNESIUM PO Take 400 mg by mouth daily.   metoprolol succinate 50 MG 24 hr tablet Commonly known as: TOPROL-XL TAKE 1 TABLET BY MOUTH EVERY DAY WITH OR IMMEDIATELY FOLLOWING A MEAL   multivitamin with minerals Tabs tablet Take 1 tablet by mouth daily.   PROBIOTIC PO Take by mouth.   sulfamethoxazole-trimethoprim 800-160 MG tablet Commonly known as: BACTRIM DS Take 1 tablet by mouth every 12 (twelve) hours. Started by: Michiel Cowboy   VITAMIN B COMPLEX PO Take 1 tablet by mouth daily.   VITAMIN C PO Take 500 Units by mouth daily.        Allergies:  Allergies  Allergen Reactions   Cephalexin Hives and Rash    Family History: Family History  Problem Relation Age of Onset   Heart disease Mother    Diabetes Mother  Arthritis Father    Hyperlipidemia Sister    Obesity Brother    Heart disease Brother    Cancer Maternal Grandmother        melanoma   Heart disease Paternal Grandfather    Kidney disease Neg Hx    Kidney cancer Neg Hx    Bladder Cancer Neg Hx    Colon cancer Neg Hx    Colon polyps Neg Hx    Stomach cancer Neg Hx    Esophageal cancer Neg Hx     Social History:  reports that she has been smoking cigarettes. She has never been exposed to tobacco smoke. She has never used smokeless tobacco. She reports current alcohol use. She reports that she does not use drugs.  ROS: For pertinent review of systems please refer  to history of present illness  Physical Exam: BP 130/68   Pulse 72   Ht 5\' 3"  (1.6 m)   Wt 160 lb (72.6 kg)   BMI 28.34 kg/m   Constitutional:  Well nourished. Alert and oriented, No acute distress. HEENT: [place] AT, moist mucus membranes.  Trachea midline Cardiovascular: No clubbing, cyanosis, or edema. Respiratory: Normal respiratory effort, no increased work of breathing. Neurologic: Grossly intact, no focal deficits, moving all 4 extremities. Psychiatric: Normal mood and affect.    Laboratory Data: Urinalysis See Epic and HPI I have reviewed the labs  Pertinent imaging:  01/06/23 10:09  Scan Result 121      Assessment & Plan:    1. High risk hematuria:    -smoker -completed hematuria work up with CT Urogram and cystoscopy in 2017- no GU malignancies were found -No complaints of gross heme -UA negative for micro heme  2. rUTI's -UA nitrate positive with bacteriuria -Urine sent for culture --Started empirically on Septra DS, will adjust if necessary once urine culture and sensitivity results are available   3. Cystocele                           -continues not to be bothersome  Return for as scheduled .  These notes generated with voice recognition software. I apologize for typographical errors.  Cloretta Ned  [facility] [address] [place], [place] [address] [phone]

## 2023-01-09 ENCOUNTER — Other Ambulatory Visit: Payer: Self-pay | Admitting: Obstetrics

## 2023-01-09 DIAGNOSIS — N6310 Unspecified lump in the right breast, unspecified quadrant: Secondary | ICD-10-CM

## 2023-01-09 DIAGNOSIS — Z124 Encounter for screening for malignant neoplasm of cervix: Secondary | ICD-10-CM | POA: Diagnosis not present

## 2023-01-09 LAB — CULTURE, URINE COMPREHENSIVE

## 2023-02-06 ENCOUNTER — Ambulatory Visit
Admission: RE | Admit: 2023-02-06 | Discharge: 2023-02-06 | Disposition: A | Discharge status: Home / Self Care | Payer: 59 | Source: Ambulatory Visit | Attending: Obstetrics | Admitting: Obstetrics

## 2023-02-06 ENCOUNTER — Other Ambulatory Visit: Payer: Self-pay | Admitting: Obstetrics

## 2023-02-06 DIAGNOSIS — N6310 Unspecified lump in the right breast, unspecified quadrant: Secondary | ICD-10-CM

## 2023-02-10 ENCOUNTER — Ambulatory Visit
Admission: RE | Admit: 2023-02-10 | Discharge: 2023-02-10 | Disposition: A | Discharge status: Home / Self Care | Payer: 59 | Source: Ambulatory Visit | Attending: Obstetrics | Admitting: Obstetrics

## 2023-02-10 DIAGNOSIS — N6310 Unspecified lump in the right breast, unspecified quadrant: Secondary | ICD-10-CM

## 2023-02-10 HISTORY — PX: BREAST BIOPSY: SHX20

## 2023-02-11 LAB — SURGICAL PATHOLOGY

## 2023-04-10 ENCOUNTER — Encounter: Payer: Self-pay | Admitting: Obstetrics and Gynecology

## 2023-04-10 ENCOUNTER — Ambulatory Visit: Payer: 59 | Admitting: Obstetrics and Gynecology

## 2023-04-10 VITALS — BP 156/87 | HR 80

## 2023-04-10 DIAGNOSIS — N812 Incomplete uterovaginal prolapse: Secondary | ICD-10-CM

## 2023-04-10 DIAGNOSIS — N3941 Urge incontinence: Secondary | ICD-10-CM | POA: Diagnosis not present

## 2023-04-10 NOTE — Progress Notes (Signed)
[facility] Return Visit  SUBJECTIVE  History of Present Illness: [name] is a 63 y.o. female seen in follow-up for prolapse.   Prolapse is more bothersome. Feels a lot of pressure, no pain. The prolapse does not necessarily feel worse but notices it more.   Sometimes she leaks urine, sometimes has to run to the bathroom. Unsure if she is emptying all the time. Does not leak with a cough or sneeze. No issues with her bowels.    Past Medical History: Patient  has a past medical history of Allergic rhinitis due to pollen, Epistaxis, recurrent, Hypercholesteremia, Hypertension, Recurrent UTI, and Tachycardia.   Past Surgical History: She  has a past surgical history that includes laparoscopic appendectomy (N/A, 06/30/2013); Colonoscopy; Breast biopsy (Right, 02/10/2023); Breast biopsy (Right, 02/10/2023); and Breast biopsy (Right, 02/10/2023).   Medications: She has a current medication list which includes the following prescription(s): ascorbic acid, aspirin ec, atorvastatin, b complex vitamins, calcium carbonate-vitamin d, cranberry, estradiol, magnesium, metoprolol succinate, multivitamin with minerals, and probiotic product.   Allergies: Patient is allergic to cephalexin.   Social History: Patient  reports that she has been smoking cigarettes. She has never been exposed to tobacco smoke. She has never used smokeless tobacco. She reports current alcohol use. She reports that she does not use drugs.     OBJECTIVE     Physical Exam: Vitals:   04/10/23 0902  BP: (!) 156/87  Pulse: 80   Gen: No apparent distress, A&O x 3.  Detailed Urogynecologic Evaluation:  Normal external genitalia. On speculum, normal vaginal mucosa and normal appearing cervix. On bimanual, uterus is small, mobile and nontender.   POP-Q  3                                            Aa   3                                           Ba  2.5                                               C   5                                            Gh  5                                            Pb  7                                            tvl   -2  Ap  -2                                            Bp  -3.5                                              D      ASSESSMENT AND PLAN    [name] is a 63 y.o. with:  1. Uterovaginal prolapse, incomplete   2. Urge incontinence    Stage III anterior, Stage I posterior, Stage III apical prolapse  - For treatment of pelvic organ prolapse, we discussed options for management including expectant management, conservative management, and surgical management, such as Kegels, a pessary, pelvic floor physical therapy, and specific surgical procedures. - She is interested in surgery. We discussed two options for prolapse repair:  1) vaginal repair without mesh - Pros - safer, no mesh complications - Cons - not as strong as mesh repair, higher risk of recurrence  2) laparoscopic repair with mesh - Pros - stronger, better long-term success - Cons - risks of mesh implant (erosion into vagina or bladder, adhering to the rectum, pain) - these risks are lower than with a vaginal mesh but still exist  - Plan for surgery: Exam under anesthesia, robotic assisted total laparoscopic hysterectomy with bilateral salpingo-oophorectomy, sacrocolpopexy, perineorrhaphy, cystoscopy, possible midurethral sling  - We reviewed the patient's specific anatomic and functional findings, with the assistance of diagrams, and together finalized the above procedure. The planned surgical procedures were discussed along with the surgical risks outlined below, which were also provided on a detailed handout. Additional treatment options including expectant management, conservative management, medical management were discussed where appropriate.  We reviewed the benefits and risks of each treatment option.  - For preop  Visit:  She is required to have a visit within 30 days of her surgery.  - Medical clearance: not required  - Anticoagulant use: No - Medicaid Hysterectomy form: No - Accepts blood transfusion: Yes - Expected length of stay: outpatient  Request sent for surgery scheduling. Pt will return for urodynamic testing to assess for occult SUI.   Marguerita Beards, MD

## 2023-04-10 NOTE — Patient Instructions (Signed)

## 2023-04-22 ENCOUNTER — Other Ambulatory Visit (HOSPITAL_COMMUNITY)
Admission: RE | Admit: 2023-04-22 | Discharge: 2023-04-22 | Disposition: A | Discharge status: Home / Self Care | Payer: 59 | Source: Other Acute Inpatient Hospital | Attending: Obstetrics and Gynecology | Admitting: Obstetrics and Gynecology

## 2023-04-22 ENCOUNTER — Ambulatory Visit (INDEPENDENT_AMBULATORY_CARE_PROVIDER_SITE_OTHER): Payer: 59 | Admitting: Obstetrics and Gynecology

## 2023-04-22 ENCOUNTER — Encounter: Payer: Self-pay | Admitting: Obstetrics and Gynecology

## 2023-04-22 VITALS — BP 149/88 | HR 75

## 2023-04-22 DIAGNOSIS — N390 Urinary tract infection, site not specified: Secondary | ICD-10-CM | POA: Diagnosis present

## 2023-04-22 DIAGNOSIS — R350 Frequency of micturition: Secondary | ICD-10-CM

## 2023-04-22 LAB — POCT URINALYSIS DIPSTICK
Bilirubin, UA: NEGATIVE
Blood, UA: NEGATIVE
Glucose, UA: NEGATIVE
Ketones, UA: NEGATIVE
Leukocytes, UA: NEGATIVE
Nitrite, UA: POSITIVE
Protein, UA: NEGATIVE
Spec Grav, UA: 1.02 (ref 1.010–1.025)
Urobilinogen, UA: 0.2 U/dL
pH, UA: 7 (ref 5.0–8.0)

## 2023-04-22 MED ORDER — NITROFURANTOIN MONOHYD MACRO 100 MG PO CAPS: 100.0000 mg | ORAL_CAPSULE | Freq: Two times a day (BID) | ORAL | 0 refills | Status: AC

## 2023-04-22 NOTE — Progress Notes (Signed)
 [facility] Return Visit  SUBJECTIVE  History of Present Illness: [name] is a 63 y.o. female seen in follow-up for Urodynamics. Upon testing her urine it was determined that she has a UTI and we are unable to move forward with the testing.      Past Medical History: Patient  has a past medical history of Allergic rhinitis due to pollen, Epistaxis, recurrent, Hypercholesteremia, Hypertension, Recurrent UTI, and Tachycardia.   Past Surgical History: She  has a past surgical history that includes laparoscopic appendectomy (N/A, 06/30/2013); Colonoscopy; Breast biopsy (Right, 02/10/2023); Breast biopsy (Right, 02/10/2023); and Breast biopsy (Right, 02/10/2023).   Medications: She has a current medication list which includes the following prescription(s): ascorbic acid, aspirin ec, atorvastatin , b complex vitamins, calcium  carbonate-vitamin d, cranberry, estradiol , magnesium, metoprolol  succinate, multivitamin with minerals, nitrofurantoin  (macrocrystal-monohydrate), and probiotic product.   Allergies: Patient is allergic to cephalexin.   Social History: Patient  reports that she has been smoking cigarettes. She has never been exposed to tobacco smoke. She has never used smokeless tobacco. She reports current alcohol use. She reports that she does not use drugs.     OBJECTIVE    Lab Results  Component Value Date   COLORU yellow 04/22/2023   CLARITYU clear 04/22/2023   GLUCOSEUR Negative 04/22/2023   BILIRUBINUR negative 04/22/2023   KETONESU negative 04/22/2023   SPECGRAV 1.020 04/22/2023   RBCUR negative 04/22/2023   PHUR 7.0 04/22/2023   PROTEINUR Negative 04/22/2023   UROBILINOGEN 0.2 04/22/2023   LEUKOCYTESUR Negative 04/22/2023     Physical Exam: Vitals:   04/22/23 0754  BP: (!) 149/88  Pulse: 75   Gen: No apparent distress, A&O x 3.  Detailed Urogynecologic Evaluation:   Deferred.     ASSESSMENT AND PLAN    Ms. Ausburn is a 63 y.o.  with:  1. Urinary frequency    Patient has signs of a UTI. Nitrofurantoin  100mg  x2 for 5 days prescribed and urine sent for culture. Will reschedule Urodynamics.    Asuna Peth G Huldah Marin, NP

## 2023-04-24 ENCOUNTER — Other Ambulatory Visit: Payer: Self-pay | Admitting: Obstetrics and Gynecology

## 2023-04-24 ENCOUNTER — Encounter: Payer: Self-pay | Admitting: Obstetrics and Gynecology

## 2023-04-24 LAB — URINE CULTURE: Culture: 50000 — AB

## 2023-04-24 MED ORDER — SULFAMETHOXAZOLE-TRIMETHOPRIM 800-160 MG PO TABS: 1.0000 | ORAL_TABLET | Freq: Two times a day (BID) | ORAL | 0 refills | Status: AC

## 2023-05-05 ENCOUNTER — Encounter: Payer: Self-pay | Admitting: Obstetrics and Gynecology

## 2023-05-05 ENCOUNTER — Ambulatory Visit (INDEPENDENT_AMBULATORY_CARE_PROVIDER_SITE_OTHER): Payer: 59 | Admitting: Obstetrics and Gynecology

## 2023-05-05 VITALS — BP 144/82 | HR 75

## 2023-05-05 DIAGNOSIS — N393 Stress incontinence (female) (male): Secondary | ICD-10-CM | POA: Diagnosis not present

## 2023-05-05 DIAGNOSIS — N3941 Urge incontinence: Secondary | ICD-10-CM

## 2023-05-05 DIAGNOSIS — R350 Frequency of micturition: Secondary | ICD-10-CM

## 2023-05-05 LAB — POCT URINALYSIS DIPSTICK
Bilirubin, UA: NEGATIVE
Blood, UA: NEGATIVE
Glucose, UA: NEGATIVE
Leukocytes, UA: NEGATIVE
Nitrite, UA: NEGATIVE
Protein, UA: NEGATIVE
Spec Grav, UA: 1.025 (ref 1.010–1.025)
Urobilinogen, UA: 0.2 U/dL
pH, UA: 6.5 (ref 5.0–8.0)

## 2023-05-05 NOTE — Progress Notes (Signed)
[facility] Urodynamics Procedure  Referring Physician: Karie Schwalbe, MD Date of Procedure: 05/05/2023  [name] is a 63 y.o. female who presents for urodynamic evaluation. Indication(s) for study: mixed incontinence  Vital Signs: BP (!) 144/82   Pulse 75   Laboratory Results: A catheterized urine specimen revealed:  POC urine:  Lab Results  Component Value Date   COLORU yellow 05/05/2023   CLARITYU clear 05/05/2023   GLUCOSEUR Negative 05/05/2023   BILIRUBINUR negative 05/05/2023   KETONESU Trace 05/05/2023   SPECGRAV 1.025 05/05/2023   RBCUR negative 05/05/2023   PHUR 6.5 05/05/2023   PROTEINUR Negative 05/05/2023   UROBILINOGEN 0.2 05/05/2023   LEUKOCYTESUR Negative 05/05/2023     Voiding Diary: Deferred  Procedure Timeout:  The correct patient was verified and the correct procedure was verified. The patient was in the correct position and safety precautions were reviewed based on at the patient's history.  Urodynamic Procedure A 26F dual lumen urodynamics catheter was placed under sterile conditions into the patient's bladder. A 26F catheter was placed into the rectum in order to measure abdominal pressure. EMG patches were placed in the appropriate position.  All connections were confirmed and calibrations/adjusted made. Saline was instilled into the bladder through the dual lumen catheters.  Cough/valsalva pressures were measured periodically during filling.  Patient was allowed to void.  The bladder was then emptied of its residual.  UROFLOW: Revealed a Qmax of 6.5 mL/sec.  She voided 69 mL and had a residual of 50 mL.  It was a prolonged normal pattern and represented normal habits though interpretation limited due to low voided volume.  CMG: This was performed with sterile water in the sitting position at a fill rate of 30 mL/min.    First sensation of fullness was 135 mLs,  First urge was 250 mLs,  Strong urge was 305 mLs and   Capacity was 408 mLs  Stress incontinence was demonstrated Highest negative Barrier CLPP was 94 cmH20 at 250 ml. Highest positive Barrier VLPP was 91 cmH20 at 250 ml.  Detrusor function was normal, with no phasic contractions seen.   Compliance:  Normal. End fill detrusor pressure was 0cmH20.  UPP: MUCP with barrier reduction was 74 cm of water.    MICTURITION STUDY: Voiding was performed with reduction using scopettes in the sitting position.  Pdet at Qmax was 38 cm of water.  Qmax was 15 mL/sec.  It was a interrupted pattern.  She voided 378 mL and had a residual of 30 mL.  It was a volitional void, sustained detrusor contraction was present and abdominal straining was present during the second half of voiding.   EMG: This was performed with patches.  She had voluntary contractions, recruitment with fill was present and urethral sphincter was not relaxed with void.  The details of the procedure with the study tracings have been scanned into EPIC.   Urodynamic Impression:  1. Sensation was normal; capacity was normal 2. Stress Incontinence was demonstrated at normal pressures; 3. Detrusor Overactivity was not demonstrated. 4. Emptying was dysfunctional with a normal PVR, a sustained detrusor contraction present,  abdominal straining present, dyssynergic urethral sphincter activity on EMG.  Plan: - The patient will follow up  to discuss the findings and treatment options.

## 2023-05-19 ENCOUNTER — Encounter (HOSPITAL_COMMUNITY): Payer: Self-pay | Admitting: Obstetrics and Gynecology

## 2023-05-19 NOTE — Pre-Procedure Instructions (Signed)
 Surgical Instructions   Your procedure is scheduled on :  Tuesday,  05-26-2023. Report to [facility] Main Entrance "A" at 9:00 A.M., then check in with the Admitting office. Any questions or running late day of surgery: call [phone]  Questions prior to your surgery date: call [phone] or [phone], Monday-Friday, 8am-4pm. If you experience any cold or flu symptoms such as cough, fever, chills, shortness of breath, etc. between now and your scheduled surgery, please notify your surgeon office.    Remember:  Do not eat any food after midnight the night before your surgery.  You may have clear liquid diet from midnight night before surgery until 8:00 AM.   You may drink clear liquids until 8:00 AM the morning of your surgery.   Clear liquids allowed are:  Water Carbonated Beverages Clear Tea (may sweeten, no milk, honey, etc.) Black Coffee Only (may sweeten, NO MILK, CREAM OR POWDERED CREAMER of any kind) Gatorade. NO clear liquids after 8:00 AM day of surgery.  This includes no water,  candy,  gum,  and mints.    Take these medicines the morning of surgery with A SIP OF WATER : Metoprolol (toprol) Atorvastatin (lipitor)   May take these medicines IF NEEDED: none    One week prior to surgery, STOP taking any Aspirin (unless otherwise instructed by your surgeon) Aleve, Naproxen, Ibuprofen, Motrin, Advil, Goody's, BC's, all herbal medications, fish oil, and non-prescription vitamins.                     DO NOT Smoke (Tobacco/Vaping) for 24 hours prior to your procedure.  If you use a CPAP at night, you may bring your mask/headgear for your overnight stay.   You will be asked to remove any contacts, glasses, piercing's, hearing aid's, dentures/partials prior to surgery. Please bring cases for these items if needed.    Patients discharged the day of surgery will not be allowed to drive home, and someone needs to stay with them for 24 hours.  SURGICAL WAITING ROOM  VISITATION Patients may have no more than 2 support people in the waiting area - these visitors may rotate.   Pre-op nurse will coordinate an appropriate time for 1 ADULT support person, who may not rotate, to accompany patient in pre-op.  Children under the age of 62 must have an adult with them who is not the patient and must remain in the main waiting area with an adult.  If the patient needs to stay at the hospital during part of their recovery, the visitor guidelines for inpatient rooms apply.  Please refer to the [facility] website for the visitor guidelines for any additional information.   If you received a COVID test during your pre-op visit  it is requested that you wear a mask when out in public, stay away from anyone that may not be feeling well and notify your surgeon if you develop symptoms. If you have been in contact with anyone that has tested positive in the last 10 days please notify you surgeon.      Pre-operative CHG Bathing Instructions   You can play a key role in reducing the risk of infection after surgery. Your skin needs to be as free of germs as possible. You can reduce the number of germs on your skin by washing with CHG (chlorhexidine gluconate) soap before surgery. CHG is an antiseptic soap that kills germs and continues to kill germs even after washing.   DO NOT use if  you have an allergy to chlorhexidine/CHG or antibacterial soaps. If your skin becomes reddened or irritated, stop using the CHG and notify Pre-Op nurse day of surgery.   If you have any skin irritation or problems with the surgical soap (CHG), do not use.  Please get a bar of gold dial soap or antibacterial soap and shower following the instructions below.             TAKE A SHOWER THE NIGHT BEFORE SURGERY AND THE DAY OF SURGERY    Please keep in mind the following:  DO NOT shave, including legs and underarms, 48 hours prior to surgery.   You may shave your face before/day of surgery.  Place  clean sheets on your bed the night before surgery Use a clean washcloth (not used since being washed) for each shower. DO NOT sleep with pet's night before surgery.  CHG Shower Instructions:  Wash your face and private area with normal soap. If you choose to wash your hair, wash first with your normal shampoo.  After you use shampoo/soap, rinse your hair and body thoroughly to remove shampoo/soap residue.  Turn the water OFF and apply half the bottle of CHG soap to a CLEAN washcloth.  Apply CHG soap ONLY FROM YOUR NECK DOWN TO YOUR TOES (washing for 3-5 minutes)  DO NOT use CHG soap on face, private areas, open wounds, or sores.  Pay special attention to the area where your surgery is being performed.  If you are having back surgery, having someone wash your back for you may be helpful. Wait 2 minutes after CHG soap is applied, then you may rinse off the CHG soap.  Pat dry with a clean towel  Put on clean pajamas    Additional instructions for the day of surgery: DO NOT APPLY any lotions, oils, cologne/ perfumes or makeup Do not wear jewelry / piercing's/  metal Do not wear nail polish, gel polish, artificial nails, or any other type of covering on natural nails (fingers and toes) Do not bring valuables to the hospital. [facility] is not responsible for valuables/personal belongings. Put on clean/comfortable clothes.  Please brush your teeth.  Ask your nurse before applying any prescription medications to the skin.

## 2023-05-19 NOTE — Progress Notes (Signed)
 Spoke w/ via phone for pre-op interview--- pt Lab needs dos----   no      Lab results------ lab appt 05-22-2023 @ 1030 getting CBC/ BMP/ T&S/ EKG COVID test -----patient states asymptomatic no test needed Arrive at ------- 0900 on 05-26-2023 NPO after MN NO Solid Food.  Clear liquids from MN until--- 0800 Pre-Surgery Ensure or G2: n/a  Med rec completed Medications to take morning of surgery ----- toprol, lipitor Diabetic medication ----- n/a  GLP1 agonist last dose: n/a GLP1 instructions:  Patient instructed no nail polish to be worn day of surgery Patient instructed to bring photo id and insurance card day of surgery Patient aware to have Driver (ride ) / caregiver    for 24 hours after surgery - daughter, caitlin Patient Special Instructions ----- will pick up bag w/ CHG and written instructions at lab appt Pre-Op special Instructions ----- n/a  Patient verbalized understanding of instructions that were given at this phone interview. Patient denies chest pain, sob, fever, cough at the interview.    Anesthesia Review:  no ;  HTN; smoker  3/4 ppd for 49yrs  PCP:  Dr Duard Larsen [facility] 10-31-2022 Cardiologist : no   Chest x-ray : no EKG :  no Echo : no Stress test: no Cardiac Cath :  no  Activity level:  denies sob w/ any activity Sleep Study/ CPAP : no  Blood Thinner/ Instructions /Last Dose:  no ASA / Instructions/ Last Dose :  ASA 81mg  takes every other day/  pt stated has not received instructions from surgeon;  advised pt to call dr schroeder office if need to stop prior to surgery

## 2023-05-22 ENCOUNTER — Encounter: Payer: Self-pay | Admitting: Obstetrics and Gynecology

## 2023-05-22 ENCOUNTER — Ambulatory Visit: Payer: 59 | Admitting: Obstetrics and Gynecology

## 2023-05-22 ENCOUNTER — Encounter (HOSPITAL_COMMUNITY)
Admission: RE | Admit: 2023-05-22 | Discharge: 2023-05-22 | Disposition: A | Discharge status: Home / Self Care | Source: Ambulatory Visit | Attending: Obstetrics and Gynecology | Admitting: Obstetrics and Gynecology

## 2023-05-22 VITALS — BP 186/75 | HR 76

## 2023-05-22 DIAGNOSIS — I517 Cardiomegaly: Secondary | ICD-10-CM | POA: Insufficient documentation

## 2023-05-22 DIAGNOSIS — N393 Stress incontinence (female) (male): Secondary | ICD-10-CM

## 2023-05-22 DIAGNOSIS — Z01818 Encounter for other preprocedural examination: Secondary | ICD-10-CM | POA: Insufficient documentation

## 2023-05-22 DIAGNOSIS — N812 Incomplete uterovaginal prolapse: Secondary | ICD-10-CM

## 2023-05-22 LAB — BASIC METABOLIC PANEL
Anion gap: 12 (ref 5–15)
BUN: 11 mg/dL (ref 8–23)
CO2: 25 mmol/L (ref 22–32)
Calcium: 9.5 mg/dL (ref 8.9–10.3)
Chloride: 101 mmol/L (ref 98–111)
Creatinine, Ser: 0.61 mg/dL (ref 0.44–1.00)
GFR, Estimated: >60 mL/min (ref >60)
Glucose, Bld: 153 mg/dL — ABNORMAL HIGH (ref 70–99)
Potassium: 3.8 mmol/L (ref 3.5–5.1)
Sodium: 138 mmol/L (ref 135–145)

## 2023-05-22 LAB — CBC
HCT: 43 % (ref 36.0–46.0)
Hemoglobin: 14.5 g/dL (ref 12.0–15.0)
MCH: 31.3 pg (ref 26.0–34.0)
MCHC: 33.7 g/dL (ref 30.0–36.0)
MCV: 92.7 fL (ref 80.0–100.0)
Platelets: 288 10*3/uL (ref 150–400)
RBC: 4.64 MIL/uL (ref 3.87–5.11)
RDW: 12.2 % (ref 11.5–15.5)
WBC: 9.8 10*3/uL (ref 4.0–10.5)
nRBC: 0 % (ref 0.0–0.2)

## 2023-05-22 MED ORDER — IBUPROFEN 600 MG PO TABS: 600.0000 mg | ORAL_TABLET | Freq: Four times a day (QID) | ORAL | 0 refills | Status: DC | PRN

## 2023-05-22 MED ORDER — ACETAMINOPHEN 500 MG PO TABS: 500.0000 mg | ORAL_TABLET | Freq: Four times a day (QID) | ORAL | 0 refills | Status: DC | PRN

## 2023-05-22 MED ORDER — POLYETHYLENE GLYCOL 3350 17 GM/SCOOP PO POWD: 17.0000 g | Freq: Every day | ORAL | 0 refills | Status: DC

## 2023-05-22 MED ORDER — OXYCODONE HCL 5 MG PO TABS: 5.0000 mg | ORAL_TABLET | ORAL | 0 refills | Status: DC | PRN

## 2023-05-22 NOTE — Progress Notes (Signed)
 [facility] Return visit  Subjective Chief Complaint: [name] presents for follow up  History of Present Illness: [name] is a 63 y.o. female with POP who underwent urodynamic testing.   She is scheduled to undergo Exam under anesthesia, robotic assisted total laparoscopic hysterectomy with bilateral salpingo-oophorectomy, sacrocolpopexy, perineorrhaphy, single incision sling, cystoscopy on 05/26/23.  Her symptoms include vaginal bulge and incontinence, and she was was found to have Stage III anterior, Stage I posterior, Stage III apical prolapse.   Urodynamic Impression:  1. Sensation was normal; capacity was normal 2. Stress Incontinence was demonstrated at normal pressures; 3. Detrusor Overactivity was not demonstrated. 4. Emptying was dysfunctional with a normal PVR, a sustained detrusor contraction present,  abdominal straining present, dyssynergic urethral sphincter activity on EMG.    Past Medical History:  Diagnosis Date   Allergic rhinitis due to pollen    Hemorrhoids    History of recurrent UTIs    Hyperlipidemia    Hypertension    Smokers' cough (HCC)    05-19-2023  per pt not every day, productive , clear   SUI (stress urinary incontinence, female)    Uterovaginal prolapse, incomplete    Wears glasses      Past Surgical History:  Procedure Laterality Date   BREAST BIOPSY Right 02/10/2023   [place] RT BREAST BX W LOC DEV 1ST LESION IMG BX SPEC US GUIDE 02/10/2023 [facility]   BREAST BIOPSY Right 02/10/2023   [place] RT BREAST BX W LOC DEV EA ADD LESION IMG BX SPEC US GUIDE 02/10/2023 [facility]   COLONOSCOPY WITH PROPOFOL  12/27/2019   dr stark   LAPAROSCOPIC APPENDECTOMY N/A 06/30/2013   Procedure: APPENDECTOMY LAPAROSCOPIC;  Surgeon: Shelly Rubenstein, MD;  Location: [facility] OR;  Service: General;  Laterality: N/A;    is allergic to cephalexin.   Family History  Problem Relation Age of Onset   Heart disease Mother    Diabetes  Mother    Arthritis Father    Hyperlipidemia Sister    Obesity Brother    Heart disease Brother    Cancer Maternal Grandmother        melanoma   Heart disease Paternal Grandfather    Kidney disease Neg Hx    Kidney cancer Neg Hx    Bladder Cancer Neg Hx    Colon cancer Neg Hx    Colon polyps Neg Hx    Stomach cancer Neg Hx    Esophageal cancer Neg Hx     Social History   Tobacco Use   Smoking status: Every Day    Current packs/day: 1.00    Types: Cigarettes    Passive exposure: Never   Smokeless tobacco: Never   Tobacco comments:    05-19-2023 pt stated smokes 3/4 ppd,  started age 56  ;   for 37 yrs  Vaping Use   Vaping status: Never Used  Substance Use Topics   Alcohol use: Not Currently    Comment: rare   Drug use: Never     Review of Systems was negative for a full 10 system review except as noted in the History of Present Illness.   Current Outpatient Medications:    acetaminophen (TYLENOL) 500 MG tablet, Take 1,000 mg by mouth every 6 (six) hours as needed for moderate pain (pain score 4-6)., Disp: , Rfl:    Ascorbic Acid (VITAMIN C PO), Take 1 tablet by mouth daily., Disp: , Rfl:    aspirin EC 81 MG tablet, Take  81 mg by mouth every other day., Disp: , Rfl:    atorvastatin (LIPITOR) 10 MG tablet, TAKE 1 TABLET BY MOUTH EVERY DAY (Patient taking differently: Take 10 mg by mouth daily.), Disp: 30 tablet, Rfl: 10   B Complex Vitamins (VITAMIN B COMPLEX PO), Take 1 tablet by mouth daily., Disp: , Rfl:    CALCIUM-VITAMIN D PO, Take 1 tablet by mouth daily., Disp: , Rfl:    CRANBERRY EXTRACT PO, Take 1 capsule by mouth daily., Disp: , Rfl:    estradiol (ESTRACE) 0.1 MG/GM vaginal cream, Place 0.5g nightly twice a week (Patient taking differently: Place 1 Applicatorful vaginally 2 (two) times a week. Place 0.5g nightly twice a week), Disp: 30 g, Rfl: 11   MAGNESIUM PO, Take 1 tablet by mouth daily., Disp: , Rfl:    metoprolol succinate (TOPROL-XL) 50 MG 24 hr tablet,  TAKE 1 TABLET BY MOUTH EVERY DAY WITH OR IMMEDIATELY FOLLOWING A MEAL (Patient taking differently: Take 50 mg by mouth daily.), Disp: 30 tablet, Rfl: 10   Multiple Vitamin (MULTIVITAMIN WITH MINERALS) TABS tablet, Take 1 tablet by mouth daily., Disp: , Rfl:    Probiotic Product (PROBIOTIC PO), Take 1 tablet by mouth daily., Disp: , Rfl:    Objective Vitals:   05/22/23 0802  BP: (!) 186/75  Pulse: 76    Gen: NAD CV: S1 S2 RRR Lungs: Clear to auscultation bilaterally Abd: soft, nontender   Previous Pelvic Exam showed: POP-Q   3                                            Aa   3                                           Ba   2.5                                              C    5                                            Gh   5                                            Pb   7                                            tvl    -2                                            Ap   -2  Bp   -3.5                                              D         Assessment/ Plan  Assessment: The patient is a 63 y.o. year old with stage III POP and SUI scheduled to undergo Exam under anesthesia, robotic assisted total laparoscopic hysterectomy with bilateral salpingo-oophorectomy, sacrocolpopexy, perineorrhaphy, single incision sling, cystoscopy.   Plan: General Surgical Consent: The patient has previously been counseled on alternative treatments, and the decision by the patient and provider was to proceed with the procedure listed above.  For all procedures, there are risks of bleeding, infection, damage to surrounding organs including but not limited to bowel, bladder, blood vessels, ureters and nerves, and need for further surgery if an injury were to occur. These risks are all low with minimally invasive surgery.   There are risks of numbness and weakness at any body site or buttock/rectal pain.  It is possible that baseline pain can be  worsened by surgery, either with or without mesh. If surgery is vaginal, there is also a low risk of possible conversion to laparoscopy or open abdominal incision where indicated. Very rare risks include blood transfusion, blood clot, heart attack, pneumonia, or death.   There is also a risk of short-term postoperative urinary retention with need to use a catheter. About half of patients need to go home from surgery with a catheter, which is then later removed in the office. The risk of long-term need for a catheter is very low. There is also a risk of worsening of overactive bladder.   Sling: The effectiveness of a midurethral vaginal mesh sling is approximately 85%, and thus, there will be times when you may leak urine after surgery, especially if your bladder is full or if you have a strong cough. There is a balance between making the sling tight enough to treat your leakage but not too tight so that you have long-term difficulty emptying your bladder. A mesh sling will not directly treat overactive bladder/urge incontinence and may worsen it.  There is an FDA safety notification on vaginal mesh procedures for prolapse but NOT mesh slings. We have extensive experience and training with mesh placement and we have close postoperative follow up to identify any potential complications from mesh. It is important to realize that this mesh is a permanent implant that cannot be easily removed. There are rare risks of mesh exposure (2-4%), pain with intercourse (0-7%), and infection (<1%). The risk of mesh exposure if more likely in a woman with risks for poor healing (prior radiation, poorly controlled diabetes, or immunocompromised). The risk of new or worsened chronic pain after mesh implant is more common in women with baseline chronic pain and/or poorly controlled anxiety or depression. Approximately 2-4% of patients will experience longer-term post-operative voiding dysfunction that may require surgical  revision of the sling. We also reviewed that postoperatively, her stream may not be as strong as before surgery.    Prolapse (with or without mesh): Risk factors for surgical failure  include things that put pressure on your pelvis and the surgical repair, including obesity, chronic cough, and heavy lifting or straining (including lifting children or adults, straining on the toilet, or lifting heavy objects such as furniture or anything weighing >25 lbs. Risks of recurrence is 20-30% with vaginal  native tissue repair and a less than 10% with sacrocolpopexy with mesh.    Sacrocolpopexy: Mesh implants may provide more prolapse support, but do have some unique risks to consider. It is important to understand that mesh is permanent and cannot be easily removed. Risks of abdominal sacrocolpopexy mesh include mesh exposure (~3-6%), painful intercourse (recent studies show lower rates after surgery compared to before, with ~5-8% risk of new onset), and very rare risks of bowel or bladder injury or infection (<1%). The risk of mesh exposure is more likely in a woman with risks for poor healing (prior radiation, poorly controlled diabetes, or immunocompromised). The risk of new or worsened chronic pain after mesh implant is more common in women with baseline chronic pain and/or poorly controlled anxiety or depression. There is an FDA safety notification on vaginal mesh procedures for prolapse but NOT abdominal mesh procedures and therefore does not apply to your surgery. We have extensive experience and training with mesh placement and we have close postoperative follow up to identify any potential complications from mesh.    We discussed consent for blood products. Risks for blood transfusion include allergic reactions, other reactions that can affect different body organs and managed accordingly, transmission of infectious diseases such as HIV or Hepatitis. However, the blood is screened. Patient consents for blood  products.  Pre-operative instructions:  She was instructed to not take Aspirin/NSAIDs x 7days prior to surgery. She may continue her 81mg  ASA. Antibiotic prophylaxis was ordered as indicated.  Catheter use: Patient will go home with foley if needed after post-operative voiding trial.  Post-operative instructions:  She was provided with specific post-operative instructions, including precautions and signs/symptoms for which we would recommend contacting us, in addition to daytime and after-hours contact phone numbers. This was provided on a handout.   Post-operative medications: Prescriptions for motrin, tylenol, miralax, and oxycodone were sent to her pharmacy. Discussed using ibuprofen and tylenol on a schedule to limit use of narcotics.   Laboratory testing:  We will check labs: cbc and type and screen  Preoperative clearance:  She does not require surgical clearance.    Post-operative follow-up:  A post-operative appointment will be made for 6 weeks from the date of surgery. If she needs a post-operative nurse visit for a voiding trial, that will be set up after she leaves the hospital.    Patient will call the clinic or use MyChart should anything change or any new issues arise.   Marguerita Beards, MD

## 2023-05-23 LAB — TYPE AND SCREEN
ABO/RH(D): B POS
Antibody Screen: NEGATIVE

## 2023-05-25 NOTE — Anesthesia Preprocedure Evaluation (Signed)
 Anesthesia Evaluation  Patient identified by MRN, date of birth, ID band Patient awake    Reviewed: Allergy & Precautions, NPO status , Patient's Chart, lab work & pertinent test results, reviewed documented beta blocker date and time   History of Anesthesia Complications Negative for: history of anesthetic complications  Airway Mallampati: II  TM Distance: >3 FB Neck ROM: Full    Dental no notable dental hx.    Pulmonary Current Smoker   Pulmonary exam normal        Cardiovascular hypertension, Pt. on medications and Pt. on home beta blockers Normal cardiovascular exam     Neuro/Psych negative neurological ROS     GI/Hepatic negative GI ROS, Neg liver ROS,,,  Endo/Other  negative endocrine ROS    Renal/GU negative Renal ROS  negative genitourinary   Musculoskeletal negative musculoskeletal ROS (+)    Abdominal   Peds  Hematology negative hematology ROS (+)   Anesthesia Other Findings Day of surgery medications reviewed with patient.  Reproductive/Obstetrics  uterovaginal prolapse, incomplete; stress urinary incontinence                             Anesthesia Physical Anesthesia Plan  ASA: 2  Anesthesia Plan: General   Post-op Pain Management: Tylenol PO (pre-op)*, Ketamine IV* and Precedex   Induction: Intravenous  PONV Risk Score and Plan: 3 and Ondansetron, Dexamethasone, Midazolam, Treatment may vary due to age or medical condition and Propofol infusion  Airway Management Planned: Oral ETT  Additional Equipment: None  Intra-op Plan:   Post-operative Plan: Extubation in OR  Informed Consent: I have reviewed the patients History and Physical, chart, labs and discussed the procedure including the risks, benefits and alternatives for the proposed anesthesia with the patient or authorized representative who has indicated his/her understanding and acceptance.     Dental  advisory given  Plan Discussed with: CRNA  Anesthesia Plan Comments:        Anesthesia Quick Evaluation

## 2023-05-25 NOTE — H&P (Signed)
 [facility] Pre Op H&P  Subjective Chief Complaint: [name] presents for follow up  History of Present Illness: [name] is a 63 y.o. female with POP who underwent urodynamic testing.   She is scheduled to undergo Exam under anesthesia, robotic assisted total laparoscopic hysterectomy with bilateral salpingo-oophorectomy, sacrocolpopexy, perineorrhaphy, single incision sling, cystoscopy on 05/26/23.  Her symptoms include vaginal bulge and incontinence, and she was was found to have Stage III anterior, Stage I posterior, Stage III apical prolapse.   Urodynamic Impression:  1. Sensation was normal; capacity was normal 2. Stress Incontinence was demonstrated at normal pressures; 3. Detrusor Overactivity was not demonstrated. 4. Emptying was dysfunctional with a normal PVR, a sustained detrusor contraction present,  abdominal straining present, dyssynergic urethral sphincter activity on EMG.    Past Medical History:  Diagnosis Date   Allergic rhinitis due to pollen    Hemorrhoids    History of recurrent UTIs    Hyperlipidemia    Hypertension    Smokers' cough (HCC)    05-19-2023  per pt not every day, productive , clear   SUI (stress urinary incontinence, female)    Uterovaginal prolapse, incomplete    Wears glasses      Past Surgical History:  Procedure Laterality Date   BREAST BIOPSY Right 02/10/2023   [place] RT BREAST BX W LOC DEV 1ST LESION IMG BX SPEC US GUIDE 02/10/2023 [facility]   BREAST BIOPSY Right 02/10/2023   [place] RT BREAST BX W LOC DEV EA ADD LESION IMG BX SPEC US GUIDE 02/10/2023 [facility]   COLONOSCOPY WITH PROPOFOL  12/27/2019   dr stark   LAPAROSCOPIC APPENDECTOMY N/A 06/30/2013   Procedure: APPENDECTOMY LAPAROSCOPIC;  Surgeon: Shelly Rubenstein, MD;  Location: [facility] OR;  Service: General;  Laterality: N/A;    is allergic to cephalexin.   Family History  Problem Relation Age of Onset   Heart disease Mother    Diabetes  Mother    Arthritis Father    Hyperlipidemia Sister    Obesity Brother    Heart disease Brother    Cancer Maternal Grandmother        melanoma   Heart disease Paternal Grandfather    Kidney disease Neg Hx    Kidney cancer Neg Hx    Bladder Cancer Neg Hx    Colon cancer Neg Hx    Colon polyps Neg Hx    Stomach cancer Neg Hx    Esophageal cancer Neg Hx     Social History   Tobacco Use   Smoking status: Every Day    Current packs/day: 1.00    Types: Cigarettes    Passive exposure: Never   Smokeless tobacco: Never   Tobacco comments:    05-19-2023 pt stated smokes 3/4 ppd,  started age 1  ;   for 37 yrs  Vaping Use   Vaping status: Never Used  Substance Use Topics   Alcohol use: Not Currently    Comment: rare   Drug use: Never     Review of Systems was negative for a full 10 system review except as noted in the History of Present Illness.  No current facility-administered medications for this encounter.  Current Outpatient Medications:    acetaminophen (TYLENOL) 500 MG tablet, Take 1,000 mg by mouth every 6 (six) hours as needed for moderate pain (pain score 4-6)., Disp: , Rfl:    Ascorbic Acid (VITAMIN C PO), Take 1 tablet by mouth daily., Disp: , Rfl:  aspirin EC 81 MG tablet, Take 81 mg by mouth every other day., Disp: , Rfl:    atorvastatin (LIPITOR) 10 MG tablet, TAKE 1 TABLET BY MOUTH EVERY DAY (Patient taking differently: Take 10 mg by mouth daily.), Disp: 30 tablet, Rfl: 10   B Complex Vitamins (VITAMIN B COMPLEX PO), Take 1 tablet by mouth daily., Disp: , Rfl:    CALCIUM-VITAMIN D PO, Take 1 tablet by mouth daily., Disp: , Rfl:    CRANBERRY EXTRACT PO, Take 1 capsule by mouth daily., Disp: , Rfl:    estradiol (ESTRACE) 0.1 MG/GM vaginal cream, Place 0.5g nightly twice a week (Patient taking differently: Place 1 Applicatorful vaginally 2 (two) times a week. Place 0.5g nightly twice a week), Disp: 30 g, Rfl: 11   MAGNESIUM PO, Take 1 tablet by mouth daily.,  Disp: , Rfl:    metoprolol succinate (TOPROL-XL) 50 MG 24 hr tablet, TAKE 1 TABLET BY MOUTH EVERY DAY WITH OR IMMEDIATELY FOLLOWING A MEAL (Patient taking differently: Take 50 mg by mouth daily.), Disp: 30 tablet, Rfl: 10   Multiple Vitamin (MULTIVITAMIN WITH MINERALS) TABS tablet, Take 1 tablet by mouth daily., Disp: , Rfl:    Probiotic Product (PROBIOTIC PO), Take 1 tablet by mouth daily., Disp: , Rfl:    acetaminophen (TYLENOL) 500 MG tablet, Take 1 tablet (500 mg total) by mouth every 6 (six) hours as needed (pain)., Disp: 30 tablet, Rfl: 0   ibuprofen (ADVIL) 600 MG tablet, Take 1 tablet (600 mg total) by mouth every 6 (six) hours as needed., Disp: 30 tablet, Rfl: 0   oxyCODONE (OXY IR/ROXICODONE) 5 MG immediate release tablet, Take 1 tablet (5 mg total) by mouth every 4 (four) hours as needed for severe pain (pain score 7-10)., Disp: 10 tablet, Rfl: 0   polyethylene glycol powder (GLYCOLAX/MIRALAX) 17 GM/SCOOP powder, Take 17 g by mouth daily. Drink 17g (1 scoop) dissolved in water per day., Disp: 255 g, Rfl: 0   Objective There were no vitals filed for this visit.   Gen: NAD CV: S1 S2 RRR Lungs: Clear to auscultation bilaterally Abd: soft, nontender   Previous Pelvic Exam showed: POP-Q   3                                            Aa   3                                           Ba   2.5                                              C    5                                            Gh   5  Pb   7                                            tvl    -2                                            Ap   -2                                            Bp   -3.5                                              D         Assessment/ Plan  The patient is a 63 y.o. year old with stage III POP and SUI scheduled to undergo Exam under anesthesia, robotic assisted total laparoscopic hysterectomy with bilateral salpingo-oophorectomy,  sacrocolpopexy, perineorrhaphy, single incision sling, cystoscopy.    Marguerita Beards, MD

## 2023-05-26 ENCOUNTER — Other Ambulatory Visit: Payer: Self-pay

## 2023-05-26 ENCOUNTER — Encounter (HOSPITAL_COMMUNITY): Payer: Self-pay | Admitting: Obstetrics and Gynecology

## 2023-05-26 ENCOUNTER — Ambulatory Visit (HOSPITAL_COMMUNITY)
Admission: RE | Admit: 2023-05-26 | Discharge: 2023-05-26 | Disposition: A | Discharge status: Home / Self Care | Payer: 59 | Source: Ambulatory Visit | Attending: Obstetrics and Gynecology | Admitting: Obstetrics and Gynecology

## 2023-05-26 ENCOUNTER — Ambulatory Visit (HOSPITAL_COMMUNITY): Payer: Self-pay | Admitting: Anesthesiology

## 2023-05-26 ENCOUNTER — Ambulatory Visit (HOSPITAL_BASED_OUTPATIENT_CLINIC_OR_DEPARTMENT_OTHER): Payer: Self-pay | Admitting: Anesthesiology

## 2023-05-26 ENCOUNTER — Encounter (HOSPITAL_COMMUNITY)
Admission: RE | Disposition: A | Discharge status: Home / Self Care | Payer: Self-pay | Source: Ambulatory Visit | Attending: Obstetrics and Gynecology

## 2023-05-26 DIAGNOSIS — Z8249 Family history of ischemic heart disease and other diseases of the circulatory system: Secondary | ICD-10-CM | POA: Insufficient documentation

## 2023-05-26 DIAGNOSIS — I10 Essential (primary) hypertension: Secondary | ICD-10-CM | POA: Insufficient documentation

## 2023-05-26 DIAGNOSIS — N8003 Adenomyosis of the uterus: Secondary | ICD-10-CM | POA: Insufficient documentation

## 2023-05-26 DIAGNOSIS — N72 Inflammatory disease of cervix uteri: Secondary | ICD-10-CM | POA: Insufficient documentation

## 2023-05-26 DIAGNOSIS — N393 Stress incontinence (female) (male): Secondary | ICD-10-CM | POA: Insufficient documentation

## 2023-05-26 DIAGNOSIS — F17210 Nicotine dependence, cigarettes, uncomplicated: Secondary | ICD-10-CM | POA: Insufficient documentation

## 2023-05-26 DIAGNOSIS — N812 Incomplete uterovaginal prolapse: Secondary | ICD-10-CM | POA: Diagnosis present

## 2023-05-26 DIAGNOSIS — N838 Other noninflammatory disorders of ovary, fallopian tube and broad ligament: Secondary | ICD-10-CM | POA: Diagnosis not present

## 2023-05-26 DIAGNOSIS — Z01818 Encounter for other preprocedural examination: Secondary | ICD-10-CM

## 2023-05-26 DIAGNOSIS — N736 Female pelvic peritoneal adhesions (postinfective): Secondary | ICD-10-CM | POA: Diagnosis not present

## 2023-05-26 DIAGNOSIS — Z79899 Other long term (current) drug therapy: Secondary | ICD-10-CM | POA: Insufficient documentation

## 2023-05-26 DIAGNOSIS — N888 Other specified noninflammatory disorders of cervix uteri: Secondary | ICD-10-CM | POA: Insufficient documentation

## 2023-05-26 HISTORY — DX: Presence of spectacles and contact lenses: Z97.3

## 2023-05-26 HISTORY — PX: BLADDER SUSPENSION: SHX72

## 2023-05-26 HISTORY — PX: PERINEOPLASTY: SHX2218

## 2023-05-26 HISTORY — DX: Hyperlipidemia, unspecified: E78.5

## 2023-05-26 HISTORY — DX: Simple chronic bronchitis: J41.0

## 2023-05-26 HISTORY — DX: Incomplete uterovaginal prolapse: N81.2

## 2023-05-26 HISTORY — DX: Stress incontinence (female) (male): N39.3

## 2023-05-26 HISTORY — PX: XI ROBOTIC ASSISTED TOTAL HYSTERECTOMY WITH SACROCOLPOPEXY: SHX6825

## 2023-05-26 HISTORY — DX: Unspecified hemorrhoids: K64.9

## 2023-05-26 HISTORY — PX: CYSTOSCOPY: SHX5120

## 2023-05-26 HISTORY — DX: Personal history of urinary (tract) infections: Z87.440

## 2023-05-26 LAB — ABO/RH: ABO/RH(D): B POS

## 2023-05-26 SURGERY — HYSTERECTOMY, TOTAL, ROBOT-ASSISTED, WITH SACROCOLPOPEXY
Anesthesia: General | Site: Perineum

## 2023-05-26 MED ORDER — ORAL CARE MOUTH RINSE: 15.0000 mL | Freq: Once | OROMUCOSAL | Status: AC

## 2023-05-26 MED ORDER — PROPOFOL 1000 MG/100ML IV EMUL
INTRAVENOUS | Status: AC
Start: 2023-05-26 — End: ?
  Filled 2023-05-26: qty 200

## 2023-05-26 MED ORDER — LIDOCAINE 2% (20 MG/ML) 5 ML SYRINGE
INTRAMUSCULAR | Status: DC | PRN
  Administered 2023-05-26: 100 mg via INTRAVENOUS

## 2023-05-26 MED ORDER — ONDANSETRON HCL 4 MG/2ML IJ SOLN
INTRAMUSCULAR | Status: AC
  Filled 2023-05-26: qty 2

## 2023-05-26 MED ORDER — ROCURONIUM BROMIDE 10 MG/ML (PF) SYRINGE
PREFILLED_SYRINGE | INTRAVENOUS | Status: AC
  Filled 2023-05-26: qty 10

## 2023-05-26 MED ORDER — HYDROMORPHONE HCL 1 MG/ML IJ SOLN: 0.2500 mg | INTRAMUSCULAR | Status: DC | PRN

## 2023-05-26 MED ORDER — GABAPENTIN 300 MG PO CAPS
ORAL_CAPSULE | ORAL | Status: AC
  Administered 2023-05-26: 300 mg via ORAL
  Filled 2023-05-26: qty 1

## 2023-05-26 MED ORDER — HYDROMORPHONE HCL 1 MG/ML IJ SOLN
INTRAMUSCULAR | Status: DC | PRN
  Administered 2023-05-26: .5 mg via INTRAVENOUS

## 2023-05-26 MED ORDER — EPHEDRINE 5 MG/ML INJ
INTRAVENOUS | Status: AC
  Filled 2023-05-26: qty 5

## 2023-05-26 MED ORDER — MIDAZOLAM HCL 2 MG/2ML IJ SOLN
INTRAMUSCULAR | Status: AC
  Filled 2023-05-26: qty 2

## 2023-05-26 MED ORDER — LIDOCAINE-EPINEPHRINE 1 %-1:100000 IJ SOLN
INTRAMUSCULAR | Status: DC | PRN
  Administered 2023-05-26: 20 mL

## 2023-05-26 MED ORDER — CHLORHEXIDINE GLUCONATE 0.12 % MT SOLN
OROMUCOSAL | Status: AC
  Administered 2023-05-26: 15 mL via OROMUCOSAL
  Filled 2023-05-26: qty 15

## 2023-05-26 MED ORDER — DEXTROSE 5 % IV SOLN
5.0000 mg/kg | INTRAVENOUS | Status: AC
  Administered 2023-05-26: 300.4 mg via INTRAVENOUS
  Filled 2023-05-26: qty 7.5

## 2023-05-26 MED ORDER — POVIDONE-IODINE 10 % EX SWAB
2.0000 | Freq: Once | CUTANEOUS | Status: AC
  Administered 2023-05-26: 2 via TOPICAL

## 2023-05-26 MED ORDER — PROPOFOL 10 MG/ML IV BOLUS
INTRAVENOUS | Status: AC
  Filled 2023-05-26: qty 20

## 2023-05-26 MED ORDER — CLINDAMYCIN PHOSPHATE 900 MG/6ML IJ SOLN
INTRAMUSCULAR | Status: AC
  Filled 2023-05-26: qty 6

## 2023-05-26 MED ORDER — STERILE WATER FOR IRRIGATION IR SOLN
Status: DC | PRN
  Administered 2023-05-26: 1000 mL

## 2023-05-26 MED ORDER — CHLORHEXIDINE GLUCONATE 0.12 % MT SOLN: 15.0000 mL | Freq: Once | OROMUCOSAL | Status: AC

## 2023-05-26 MED ORDER — KETOROLAC TROMETHAMINE 30 MG/ML IJ SOLN
INTRAMUSCULAR | Status: DC | PRN
  Administered 2023-05-26: 30 mg via INTRAVENOUS

## 2023-05-26 MED ORDER — SUGAMMADEX SODIUM 200 MG/2ML IV SOLN
INTRAVENOUS | Status: DC | PRN
  Administered 2023-05-26: 200 mg via INTRAVENOUS

## 2023-05-26 MED ORDER — ROCURONIUM BROMIDE 10 MG/ML (PF) SYRINGE
PREFILLED_SYRINGE | INTRAVENOUS | Status: DC | PRN
  Administered 2023-05-26: 20 mg via INTRAVENOUS
  Administered 2023-05-26: 10 mg via INTRAVENOUS
  Administered 2023-05-26: 20 mg via INTRAVENOUS
  Administered 2023-05-26: 10 mg via INTRAVENOUS
  Administered 2023-05-26: 60 mg via INTRAVENOUS
  Administered 2023-05-26: 10 mg via INTRAVENOUS
  Administered 2023-05-26: 20 mg via INTRAVENOUS

## 2023-05-26 MED ORDER — CLINDAMYCIN PHOSPHATE 900 MG/50ML IV SOLN
900.0000 mg | INTRAVENOUS | Status: AC
  Administered 2023-05-26: 900 mg via INTRAVENOUS
  Filled 2023-05-26: qty 50

## 2023-05-26 MED ORDER — GABAPENTIN 300 MG PO CAPS: 300.0000 mg | ORAL_CAPSULE | ORAL | Status: AC

## 2023-05-26 MED ORDER — ONDANSETRON HCL 4 MG/2ML IJ SOLN
INTRAMUSCULAR | Status: DC | PRN
  Administered 2023-05-26: 4 mg via INTRAVENOUS

## 2023-05-26 MED ORDER — HYDROMORPHONE HCL 1 MG/ML IJ SOLN
INTRAMUSCULAR | Status: AC
  Filled 2023-05-26: qty 0.5

## 2023-05-26 MED ORDER — PHENYLEPHRINE 80 MCG/ML (10ML) SYRINGE FOR IV PUSH (FOR BLOOD PRESSURE SUPPORT)
PREFILLED_SYRINGE | INTRAVENOUS | Status: DC | PRN
  Administered 2023-05-26 (×2): 80 ug via INTRAVENOUS
  Administered 2023-05-26: 160 ug via INTRAVENOUS
  Administered 2023-05-26: 80 ug via INTRAVENOUS

## 2023-05-26 MED ORDER — LACTATED RINGERS IV SOLN
INTRAVENOUS | Status: DC
Start: 2023-05-26 — End: 2023-05-27

## 2023-05-26 MED ORDER — ENOXAPARIN SODIUM 40 MG/0.4ML IJ SOSY
PREFILLED_SYRINGE | INTRAMUSCULAR | Status: AC
  Administered 2023-05-26: 40 mg via SUBCUTANEOUS
  Filled 2023-05-26: qty 0.4

## 2023-05-26 MED ORDER — PROPOFOL 500 MG/50ML IV EMUL
INTRAVENOUS | Status: DC | PRN
  Administered 2023-05-26: 200 ug/kg/min via INTRAVENOUS

## 2023-05-26 MED ORDER — DEXMEDETOMIDINE HCL IN NACL 80 MCG/20ML IV SOLN
INTRAVENOUS | Status: DC | PRN
  Administered 2023-05-26 (×2): 8 ug via INTRAVENOUS

## 2023-05-26 MED ORDER — EPHEDRINE SULFATE-NACL 50-0.9 MG/10ML-% IV SOSY
PREFILLED_SYRINGE | INTRAVENOUS | Status: DC | PRN
  Administered 2023-05-26: 5 mg via INTRAVENOUS

## 2023-05-26 MED ORDER — PHENAZOPYRIDINE HCL 100 MG PO TABS
ORAL_TABLET | ORAL | Status: AC
  Administered 2023-05-26: 200 mg via ORAL
  Filled 2023-05-26: qty 2

## 2023-05-26 MED ORDER — PROPOFOL 10 MG/ML IV BOLUS
INTRAVENOUS | Status: DC | PRN
  Administered 2023-05-26: 150 mg via INTRAVENOUS

## 2023-05-26 MED ORDER — DEXAMETHASONE SODIUM PHOSPHATE 10 MG/ML IJ SOLN
INTRAMUSCULAR | Status: DC | PRN
  Administered 2023-05-26: 10 mg via INTRAVENOUS

## 2023-05-26 MED ORDER — BUPIVACAINE HCL (PF) 0.25 % IJ SOLN
INTRAMUSCULAR | Status: DC | PRN
  Administered 2023-05-26: 20 mL

## 2023-05-26 MED ORDER — FENTANYL CITRATE (PF) 250 MCG/5ML IJ SOLN
INTRAMUSCULAR | Status: AC
  Filled 2023-05-26: qty 5

## 2023-05-26 MED ORDER — PHENAZOPYRIDINE HCL 100 MG PO TABS: 200.0000 mg | ORAL_TABLET | ORAL | Status: AC

## 2023-05-26 MED ORDER — ACETAMINOPHEN 500 MG PO TABS
ORAL_TABLET | ORAL | Status: AC
  Administered 2023-05-26: 1000 mg via ORAL
  Filled 2023-05-26: qty 1

## 2023-05-26 MED ORDER — ENOXAPARIN SODIUM 40 MG/0.4ML IJ SOSY: 40.0000 mg | PREFILLED_SYRINGE | INTRAMUSCULAR | Status: AC

## 2023-05-26 MED ORDER — LIDOCAINE 2% (20 MG/ML) 5 ML SYRINGE
INTRAMUSCULAR | Status: AC
  Filled 2023-05-26: qty 5

## 2023-05-26 MED ORDER — MIDAZOLAM HCL 5 MG/5ML IJ SOLN
INTRAMUSCULAR | Status: DC | PRN
  Administered 2023-05-26: 2 mg via INTRAVENOUS

## 2023-05-26 MED ORDER — FENTANYL CITRATE (PF) 100 MCG/2ML IJ SOLN
INTRAMUSCULAR | Status: DC | PRN
Start: 2023-05-26 — End: 2023-05-26
  Administered 2023-05-26: 50 ug via INTRAVENOUS
  Administered 2023-05-26: 100 ug via INTRAVENOUS
  Administered 2023-05-26 (×2): 50 ug via INTRAVENOUS

## 2023-05-26 MED ORDER — ACETAMINOPHEN 500 MG PO TABS
1000.0000 mg | ORAL_TABLET | ORAL | Status: AC
  Filled 2023-05-26: qty 2

## 2023-05-26 MED ORDER — DROPERIDOL 2.5 MG/ML IJ SOLN: 0.6250 mg | Freq: Once | INTRAMUSCULAR | Status: DC | PRN

## 2023-05-26 MED ORDER — PROPOFOL 1000 MG/100ML IV EMUL
INTRAVENOUS | Status: AC
  Filled 2023-05-26: qty 100

## 2023-05-26 MED ORDER — SODIUM CHLORIDE 0.9 % IR SOLN
Status: DC | PRN
  Administered 2023-05-26: 1000 mL

## 2023-05-26 MED ORDER — DEXAMETHASONE SODIUM PHOSPHATE 10 MG/ML IJ SOLN
INTRAMUSCULAR | Status: AC
  Filled 2023-05-26: qty 1

## 2023-05-26 SURGICAL SUPPLY — 89 items
BLADE CLIPPER SENSICLIP SURGIC (BLADE) ×4 IMPLANT
BLADE SURG 15 STRL LF DISP TIS (BLADE) ×4 IMPLANT
CATH FOLEY 3WAY 5CC 16FR (CATHETERS) ×4 IMPLANT
CHLORAPREP W/TINT 26 (MISCELLANEOUS) ×4 IMPLANT
COVER BACK TABLE 60X90IN (DRAPES) ×4 IMPLANT
COVER TIP SHEARS 8 DVNC (MISCELLANEOUS) ×4 IMPLANT
DEFOGGER SCOPE WARMER CLEARIFY (MISCELLANEOUS) ×4 IMPLANT
DERMABOND ADVANCED .7 DNX12 (GAUZE/BANDAGES/DRESSINGS) ×4 IMPLANT
DERMABOND ADVANCED .7 DNX6 (GAUZE/BANDAGES/DRESSINGS) IMPLANT
DEVICE CAPIO SLIM SINGLE (INSTRUMENTS) IMPLANT
DRAPE ARM DVNC X/XI (DISPOSABLE) ×16 IMPLANT
DRAPE COLUMN DVNC XI (DISPOSABLE) ×4 IMPLANT
DRAPE SHEET LG 3/4 BI-LAMINATE (DRAPES) ×4 IMPLANT
DRAPE SURG IRRIG POUCH 19X23 (DRAPES) ×4 IMPLANT
DRAPE UTILITY XL STRL (DRAPES) ×4 IMPLANT
DRIVER NDL LRG 8 DVNC XI (INSTRUMENTS) IMPLANT
DRIVER NDL MEGA SUTCUT DVNCXI (INSTRUMENTS) IMPLANT
DRIVER NDLE LRG 8 DVNC XI (INSTRUMENTS) ×4 IMPLANT
DRIVER NDLE MEGA SUTCUT DVNCXI (INSTRUMENTS) ×4 IMPLANT
DURAPREP 26ML APPLICATOR (WOUND CARE) IMPLANT
ELECT REM PT RETURN 9FT ADLT (ELECTROSURGICAL) ×4 IMPLANT
ELECTRODE REM PT RTRN 9FT ADLT (ELECTROSURGICAL) ×4 IMPLANT
FORCEPS BPLR 8 MD DVNC XI (FORCEP) IMPLANT
GAUZE 4X4 16PLY ~~LOC~~+RFID DBL (SPONGE) ×4 IMPLANT
GLOVE BIOGEL PI IND STRL 6.5 (GLOVE) ×4 IMPLANT
GLOVE BIOGEL PI IND STRL 7.0 (GLOVE) ×4 IMPLANT
GLOVE ECLIPSE 6.0 STRL STRAW (GLOVE) ×12 IMPLANT
GLOVE SURG UNDER POLY LF SZ6.5 (GLOVE) ×16 IMPLANT
GOWN STRL REUS W/ TWL LRG LVL3 (GOWN DISPOSABLE) ×4 IMPLANT
GOWN STRL REUS W/TWL LRG LVL3 (GOWN DISPOSABLE) ×4 IMPLANT
GRASPER TIP-UP FEN DVNC XI (INSTRUMENTS) IMPLANT
HIBICLENS CHG 4% 4OZ BTL (MISCELLANEOUS) ×4 IMPLANT
HOLDER FOLEY CATH W/STRAP (MISCELLANEOUS) ×4 IMPLANT
IRRIG SUCT STRYKERFLOW 2 WTIP (MISCELLANEOUS) ×4 IMPLANT
IRRIGATION SUCT STRKRFLW 2 WTP (MISCELLANEOUS) ×4 IMPLANT
KIT PINK PAD W/HEAD ARE REST (MISCELLANEOUS) ×4 IMPLANT
KIT PINK PAD W/HEAD ARM REST (MISCELLANEOUS) ×4 IMPLANT
KIT TURNOVER KIT B (KITS) ×4 IMPLANT
LEGGING LITHOTOMY PAIR STRL (DRAPES) ×4 IMPLANT
MANIFOLD NEPTUNE II (INSTRUMENTS) ×4 IMPLANT
MANIPULATOR ADVINCU DEL 2.5 PL (MISCELLANEOUS) IMPLANT
MANIPULATOR ADVINCU DEL 3.0 PL (MISCELLANEOUS) IMPLANT
MANIPULATOR ADVINCU DEL 3.5 PL (MISCELLANEOUS) IMPLANT
MANIPULATOR ADVINCU DEL 4.0 PL (MISCELLANEOUS) IMPLANT
MESH VERTESSA LITE -Y 2X4X3 (Mesh General) ×4 IMPLANT
NDL HYPO 22X1.5 SAFETY MO (MISCELLANEOUS) ×4 IMPLANT
NDL INSUFFLATION 14GA 120MM (NEEDLE) ×4 IMPLANT
NEEDLE HYPO 22X1.5 SAFETY MO (MISCELLANEOUS) ×4 IMPLANT
NEEDLE INSUFFLATION 14GA 120MM (NEEDLE) ×4 IMPLANT
NS IRRIG 1000ML POUR BTL (IV SOLUTION) ×4 IMPLANT
OBTURATOR OPTICAL STND 8 DVNC (TROCAR) ×4 IMPLANT
OBTURATOR OPTICALSTD 8 DVNC (TROCAR) ×4 IMPLANT
PACK CYSTO (CUSTOM PROCEDURE TRAY) ×4 IMPLANT
PACK ROBOT WH (CUSTOM PROCEDURE TRAY) ×4 IMPLANT
PACK ROBOTIC GOWN (GOWN DISPOSABLE) ×4 IMPLANT
PACK VAGINAL WOMENS (CUSTOM PROCEDURE TRAY) ×4 IMPLANT
PAD OB MATERNITY 11 LF (PERSONAL CARE ITEMS) ×4 IMPLANT
PATTIES SURGICAL .5 X3 (DISPOSABLE) IMPLANT
PROTECTOR NERVE ULNAR (MISCELLANEOUS) ×4 IMPLANT
RETRACTOR LONE STAR DISPOSABLE (INSTRUMENTS) ×4 IMPLANT
RETRACTOR STAY HOOK 5MM (MISCELLANEOUS) ×4 IMPLANT
SCISSORS MNPLR CVD DVNC XI (INSTRUMENTS) IMPLANT
SCRUB CHG 4% DYNA-HEX 4OZ (MISCELLANEOUS) ×4 IMPLANT
SEAL UNIV 5-12 XI (MISCELLANEOUS) ×20 IMPLANT
SEALER VESSEL EXT DVNC XI (MISCELLANEOUS) IMPLANT
SET IRRIG Y TYPE TUR BLADDER L (SET/KITS/TRAYS/PACK) ×4 IMPLANT
SET TUBE SMOKE EVAC HIGH FLOW (TUBING) ×4 IMPLANT
SLEEVE SCD COMPRESS KNEE MED (STOCKING) ×4 IMPLANT
SLING ALTIS SYSTEM (Sling) IMPLANT
SPIKE FLUID TRANSFER (MISCELLANEOUS) ×4 IMPLANT
SUCTION TUBE FRAZIER 10FR DISP (SUCTIONS) ×4 IMPLANT
SURGIFLO W/THROMBIN 8M KIT (HEMOSTASIS) IMPLANT
SUT ABS MONO DBL WITH NDL 48IN (SUTURE) IMPLANT
SUT GORETEX NAB #0 THX26 36IN (SUTURE) ×8 IMPLANT
SUT MNCRL AB 4-0 PS2 18 (SUTURE) ×8 IMPLANT
SUT MON AB 2-0 SH 27 (SUTURE) ×4 IMPLANT
SUT STRATA PDS 2-0 23 CT-1 (SUTURE) ×8 IMPLANT
SUT V-LOC BARB 180 2/0GR9 GS23 (SUTURE) ×8 IMPLANT
SUT VIC AB 0 CT1 27XBRD ANTBC (SUTURE) IMPLANT
SUT VIC AB 0 CT1 36 (SUTURE) IMPLANT
SUT VIC AB 2-0 SH 27XBRD (SUTURE) ×4 IMPLANT
SUT VIC AB 3-0 SH 18 (SUTURE) IMPLANT
SUT VICRYL 2-0 SH 8X27 (SUTURE) IMPLANT
SUT VLOC 180 0 9IN GS21 (SUTURE) ×4 IMPLANT
SUTURE V-LC BRB 180 2/0GR9GS23 (SUTURE) ×8 IMPLANT
SYR BULB EAR ULCER 3OZ GRN STR (SYRINGE) ×4 IMPLANT
TOWEL GREEN STERILE FF (TOWEL DISPOSABLE) ×8 IMPLANT
TOWEL OR 17X24 6PK STRL BLUE (TOWEL DISPOSABLE) ×4 IMPLANT
TRAY FOLEY W/BAG SLVR 14FR (SET/KITS/TRAYS/PACK) ×4 IMPLANT

## 2023-05-26 NOTE — Transfer of Care (Signed)
 Immediate Anesthesia Transfer of Care Note  Patient: [name]  Procedure(s) Performed: XI ROBOTIC ASSISTED TOTAL HYSTERECTOMY WITH BILATERAL SALPINGO-OOPHORECTOMY AND SACROCOLPOPEXY (Abdomen) PERINEOPLASTY, PERINEORRHAPHY AND POSTERIOR REPAIR (Perineum) TRANSVAGINAL TAPE (TVT) PROCEDURE (Pelvis) CYSTOSCOPY (Bladder)  Patient Location: PACU  Anesthesia Type:General  Level of Consciousness: awake, alert , and oriented  Airway & Oxygen Therapy: Patient Spontanous Breathing and Patient connected to nasal cannula oxygen  Post-op Assessment: Report given to RN and Post -op Vital signs reviewed and stable  Post vital signs: Reviewed and stable  Last Vitals:  Vitals Value Taken Time  BP    Temp    Pulse 78 05/26/23 1534  Resp 13 05/26/23 1534  SpO2 88 % 05/26/23 1534  Vitals shown include unfiled device data.  Last Pain:  Vitals:   05/26/23 0954  TempSrc:   PainSc: 0-No pain         Complications: No notable events documented.

## 2023-05-26 NOTE — Interval H&P Note (Signed)
 History and Physical Interval Note:  05/26/2023 10:43 AM  [name]  has presented today for surgery, with the diagnosis of uterovaginal prolapse, incomplete; stress urinary incontinence.  The various methods of treatment have been discussed with the patient and family. After consideration of risks, benefits and other options for treatment, the patient has consented to  Procedure(s) with comments: XI ROBOTIC ASSISTED TOTAL HYSTERECTOMY WITH BILATERAL SALPINGO-OOPHORECTOMY AND SACROCOLPOPEXY (N/A)  PERINEOPLASTY (N/A) TRANSVAGINAL TAPE (TVT) PROCEDURE--POSSIBLE (N/A) CYSTOSCOPY (N/A) as a surgical intervention.  The patient's history has been reviewed, patient examined, no change in status, stable for surgery.  I have reviewed the patient's chart and labs.  Questions were answered to the patient's satisfaction.     Marguerita Beards

## 2023-05-26 NOTE — Anesthesia Procedure Notes (Signed)
 Procedure Name: Intubation Date/Time: 05/26/2023 11:39 AM  Performed by: Bishop Limbo, CRNAPre-anesthesia Checklist: Patient identified, Emergency Drugs available, Suction available and Patient being monitored Patient Re-evaluated:Patient Re-evaluated prior to induction Oxygen Delivery Method: Circle System Utilized Preoxygenation: Pre-oxygenation with 100% oxygen Induction Type: IV induction Ventilation: Mask ventilation without difficulty Laryngoscope Size: Mac and 3 Grade View: Grade I Tube type: Oral Tube size: 7.0 mm Number of attempts: 1 Airway Equipment and Method: Stylet Placement Confirmation: ETT inserted through vocal cords under direct vision, positive ETCO2 and breath sounds checked- equal and bilateral Secured at: 22 cm Tube secured with: Tape Dental Injury: Teeth and Oropharynx as per pre-operative assessment

## 2023-05-26 NOTE — Anesthesia Postprocedure Evaluation (Signed)
 Anesthesia Post Note  Patient: [name]  Procedure(s) Performed: XI ROBOTIC ASSISTED TOTAL HYSTERECTOMY WITH BILATERAL SALPINGO-OOPHORECTOMY AND SACROCOLPOPEXY (Abdomen) PERINEOPLASTY, PERINEORRHAPHY AND POSTERIOR REPAIR (Perineum) TRANSVAGINAL TAPE (TVT) PROCEDURE (Pelvis) CYSTOSCOPY (Bladder)     Patient location during evaluation: PACU Anesthesia Type: General Level of consciousness: awake and alert Pain management: pain level controlled Vital Signs Assessment: post-procedure vital signs reviewed and stable Respiratory status: spontaneous breathing, nonlabored ventilation, respiratory function stable and patient connected to nasal cannula oxygen Cardiovascular status: blood pressure returned to baseline and stable Postop Assessment: no apparent nausea or vomiting Anesthetic complications: no  No notable events documented.  Last Vitals:  Vitals:   05/26/23 1616 05/26/23 1630  BP: 135/67 138/88  Pulse: 76 75  Resp: (!) 23 14  Temp:    SpO2: 94% 95%    Last Pain:  Vitals:   05/26/23 0954  TempSrc:   PainSc: 0-No pain                 Trevor Iha

## 2023-05-26 NOTE — Op Note (Signed)
 Operative Note  Preoperative Diagnosis: anterior vaginal prolapse, posterior vaginal prolapse, uterovaginal prolapse, incomplete, and stress urinary incontinence  Postoperative Diagnosis: same  Procedures performed:  Robotic assisted total laparoscopic hysterectomy, bilateral salpingo-oophorectomy, sacrocolpopexy [name]), single incision sling (Altis), cystoscopy, posterior repair and perineorrhaphy  Implants:  Implant Name Type Inv. Item Serial No. Manufacturer Lot No. LRB No. Used Action  MESH [employer] [id number] [employer] MESH [employer] [place] [id number]  [facility] MEDICAL [id number] N/A 1 Implanted  [place] ALTIS SYSTEM - [id number] Sling SLING ALTIS SYSTEM  COLOPLAST [id number] N/A 1 Implanted    Attending Surgeon: Lanetta Inch, MD  Assistant: Webb Silversmith, RNFA  Anesthesia: General endotracheal  Findings: 1. On vaginal exam, stage III apical prolapse present  2. On cystoscopy, normal bladder and urethral mucosa without injury or lesion. Brisk bilateral ureteral efflux present.    3. On laparoscopy, filmy adhesions were present from the bowel to the sacrum and right pelvic sidewall, and from the bowel to the left pelvic sidewall and the left adnexa.   Specimens:  ID Type Source Tests Collected by Time Destination  1 : UTERUS WITH CERVIX, BILATERAL FALLOPIAN TUBES, BILATERAL OVARIES Tissue PATH Gyn benign resection SURGICAL PATHOLOGY Marguerita Beards, MD 05/26/2023 1259     Estimated blood loss: 50 mL  IV fluids: 1400 mL  Urine output: 150 mL  Complications: none  Procedure in Detail:  After informed consent was obtained, the patient was taken to the operating room, where general anesthesia was induced and found to be adequate. She was placed in dorsolithotomy position in yellowfin stirrups. Her hips were noted not to be hyperflexed or hyperextended. Her arms were padded with gel pads and tucked to her sides. Her hands were surrounded by foam.  A padded strap was placed across her chest with foam between the pad and her skin. She was noted to be appropriately positioned with all pressure points well padded and off tension. A tilt test showed no slippage. She was prepped and draped in the usual sterile fashion. A uterine manipulator was placed in the uterus after sounding to 9 cm, an appropriately sized Koh ring was placed around the cervix, and a pneumo-occluder balloon was positioned in the vagina for later use.  A sterile Foley catheter was inserted.   0.25% plain Marcaine was injected in the supraumbilical  area and an 8 mm supraumbilical skin incision was made with the scalpel.  A Veress needle was inserted into the incision, CO2 insufflation was started, a low opening pressure was noted, and pneumoperitoneum was obtained. The Veress needle was removed and a 8mm robotic trocar was placed. The robotic camera was inserted and intraperitoneal placement was confirmed. Survey of the abdomen and pelvis revealed the findings as noted above.  After determining placement for the other ports, Local anesthetic was injected at each site and two 8 mm incisions were made for robotic ports at 10 cm lateral to and at the level of the umbilical port. Two additional 8 mm incisions were made 10 cm lateral to these and 30 degrees down followed by 8 mm robotic ports - the right side for an assistant port. All trocars were placed sequentially under direct visualization of the camera. The patient was placed in Trendelenburg. The robot was docked on the patient's right side. Monopolar endoshears were placed in the right arm alternating with the vessel sealer, a [place] bipolar grasper was placed in the 2nd arm of the patient's left side, and a Tip  up grasper was placed in the 3rd arm on the patient's left side.   Attention was then turned to the sacral promontory. The peritoneum overlying the sacral promontory was tented up, dissected sharply with monopolar scissors and  electrosurgery using layer by layer technique. The peritoneal incision was extended down to the posterior cul-de-sac. This was performed with care to avoid the ureter on the right side and the sigmoid colon and its mesentary on the left side. Two transverse sutures of CV2 Gortex were placed in the anterior longitudinal ligament.  Attention was then turned to the robotic hysterectomy and BSO. The ureter was identified and was found to be well away from the planned site of incision. Using the monopolar scissors, a window was made on the posterior leaf of the broad ligament. The infundibulopelvic ligament was cauterized and transected. The right round ligament was grasped, cauterized, and transected with electrocautery. The anterior and posterior leaves of the broad ligament were taken down with cautery and sharp dissection. The uterine artery was skeletonized and the bladder flap was created on the right side with a combination of electrosurgery and sharp dissection. The KOH ring was identified. The right uterine artery was clamped, cauterized, and transected. In a similar fashion, the left side was taken down. Further sharp dissection with combination of cautery was performed to further develop the bladder flap. At this point, the KOH ring was completely hugging the cervix. The pneumo-occluder balloon in the vagina was inflated to maintain pneumoperitoneum. A colpotomy was performed with electrosurgical cutting current and the uterus and cervix were completely amputated from the vagina. The specimen was delivered through the vagina. The posterior portion of the vaginal cuff was then grasped and pulled up to maintain pneumoperitoneum. The pneumo-occluder balloon was then replaced in the vagina. The right hand instrument was changed to a suture-cut needle driver. The vaginal cuff was then closed using a 0 V-loc suture in two layers.    The right hand instrument was replaced with monopolar endoshears. With a lucite  probe in the vagina, the anterior vaginal dissection was then performed with sharp dissection and electrosurgery. The posterior vaginal dissection was then performed with sharp dissection and electrosurgery in order to dissect the rectum away from the posterior vagina.  A "Y" mesh was then inserted into the abdomen after trimming to appropriate size. With the probe in the vagina, the anterior leaf of the Y mesh was affixed to the anterior portion of the vagina using a 2-0 v-loc suture in a spiral pattern to distribute the suture evenly across the surface of the anterior mesh leaf. In a similar fashion, the posterior leaf of the Y mesh was attached to the posterior surface of the vagina with 2-0 v-loc suture.  The distal end of the mesh was then brought to overlie the sacrum. The correct amount of tension was determined in order to elevate the vagina, but not put the mesh under tension. The distal end of the mesh was then affixed to the anterior longitudinal sacral ligament using two interrupted transverse stitches of CV2 Gortex. The excess distal mesh was then cut and removed. The peritoneum was reapproximated over the mesh using 2-0 monocryl. The bladder flap was incorporated to completely retroperitonealize the mesh. All pedicles were carefully inspected and noted to be hemostatic as the CO2 gas was deflated. All instruments were removed from the patient's abdomen.   The Foley catheter was removed.  A 70-degree cystoscope was introduced, and 360-degree inspection revealed no injury, lesion or  foreign body in the bladder. Brisk bilateral ureteral efflux was noted with the assistance of pyridium.  The bladder was drained and the cystoscope was removed.  The Foley catheter was replaced.  The robot was undocked. The CO2 gas was removed and the ports were removed.  The skin incisions were closed with subcutaneous stitches of 4-0 Monocryl and covered with skin glue.    The sling was performed next. A lonestar  self-retraining retractor was placed with 4 stay hooks. The mid urethral area was located on the anterior vaginal wall.  Two Allis clamps were placed at the level of the midurethra. 1% lidocaine with epinephrine was injected into the vaginal mucosa. A vertical incision was made between the two clamps using a 15-blade scalpel.  Using sharp dissection, Metzenbaum scissors were used to make a periurethral tunnel from the vaginal incision towards the pubic rami bilaterally for the future sling tracts. The bladder was ensured to be empty. The trocar and attached sling were introduced into the right side of the periurethral vaginal incision, just inferior to the pubic rami. The trocar was guided through the endopelvic fascia with a cephalad drift then toward the obturator membrane in the 10 o'clock position. The trocar was given a quarter turn and the anchor was felt to engage into the obturator membrane. The trocar was removed and this was repeated on the left side in a similar fashion.   A 70-degree cystoscope was introduced, and 360-degree inspection revealed no trauma or trocars in the bladder, with brisk bilateral ureteral efflux.  The bladder was drained and the cystoscope was removed.  The Foley catheter was reinserted.  The sling was tensioned with the prolene suture, enough to touch the urethral tissue but without tension. The prolene suture was trimmed. No sling perforation was noted in the vaginal sulcus. The vaginal mucosal edges were reapproximated using 2-0 Vicryl.  The vagina was copiously irrigated.  Hemostasis was again noted.    After informed consent was obtained, the patient was taken to the operating room where general anesthesia was induced. She was placed in dorsal lithotomy position, taking care to avoid any traction of the extremities and prepped and draped in the usual sterile fashion. A self-retaining lonestar retractor was placed using four elastic blue stays.  After a foley catheter was  inserted into the urethra.  Two Allis clamps were placed in the midline of the posterior vaginal wall defect.  1% lidocaine with epinephrine was injected into the vaginal mucosa and over the perineum. A vertical incision was made between these clamps with a 15 blade scalpel and a diamond shaped incision was made over the introitus. Excess perineal skin was removed.  The rectovaginal septum was then dissected off the vaginal mucosa bilaterally.  No enterocele was noted.  The rectovaginal septum was then reapproximated with plicating sutures of 2-0 Vicryl.  After placement of the first plication stitch two fingers were inserted into the vaginal to confirm adequate caliber.  The last distal stitch incorporated the perineal body in a U stitch fashion.  After plication, the excess vaginal mucosa was trimmed and the vaginal mucosa was reapproximated using 2-0 Vicryl sutures in a running fashion.   Attention was then turned to the perineum. Two allis clamps were placed at the introitus.  The perineal body was then reapproximated with two interrupted 0-vicryl sutures. The perineal skin was then closed with a 2-0 vicryl in a subcutaneous and subcuticular fashion. Irrigation was performed and good hemostasis was noted. Vaginal packing was  not placed.  A rectal examination was normal and confirmed no sutures within the rectum.  The patient tolerated the procedure well.  She was awakened from anesthesia and transferred to the recovery room in stable condition. All counts were correct x 2.   Marguerita Beards, MD

## 2023-05-26 NOTE — Discharge Instructions (Addendum)

## 2023-05-26 NOTE — OR Nursing (Signed)
 Bladder trial begins. 280 cc sterile saline instilled into bladder via cathetor. Cathetor balloon deflated and removed. Patient assisted to restroom, voided 275cc pink colored urine without difficulty. Patient assisted back to stretcher. Bladder scanned for 0cc x 2.  Dr. Florian Buff nptified. Orders received to discharge patient home without cathetor.  Margarita Mail rn

## 2023-05-27 ENCOUNTER — Encounter (HOSPITAL_COMMUNITY): Payer: Self-pay | Admitting: Obstetrics and Gynecology

## 2023-05-27 ENCOUNTER — Telehealth: Payer: Self-pay | Admitting: Obstetrics and Gynecology

## 2023-05-27 NOTE — Telephone Encounter (Signed)
 [name] underwent Robotic assisted total laparoscopic hysterectomy, bilateral salpingo-oophorectomy, sacrocolpopexy [name]), single incision sling (Altis), cystoscopy, posterior repair and perineorrhaphy on 05/26/23.   She passed her voiding trial.  was backfilled into the bladder Voided  PVR by bladder scan was 0ml.   She was discharged without a catheter. Please call her for a routine post op check. Thanks!  Marguerita Beards, MD

## 2023-05-28 LAB — SURGICAL PATHOLOGY

## 2023-05-28 NOTE — Telephone Encounter (Signed)
 Called and spoke to patient:  Patient reports she took a shower and she had a small bowel movement today.  Reports she is doing the Tylenol and Ibuprofen but has not needed the narcotic. Reports pain 4-5/10.  Reports she has had some coughing and sneezing and it put a little pressure.  Reports she is eating warm foods and took miralax. She is hoping for a better bowel movement.  States she has been taking frequent rest periods.  Very little reported bleeding the first couple of days but today has not seen any.  Patient reports she can urinate and states she is emptying her bladder well. Did have one episode of incontinence.  Encouraged her to call with any concerns and she reports she will.

## 2023-07-06 ENCOUNTER — Encounter: Payer: 59 | Admitting: Obstetrics and Gynecology

## 2023-07-08 ENCOUNTER — Ambulatory Visit (INDEPENDENT_AMBULATORY_CARE_PROVIDER_SITE_OTHER): Payer: 59 | Admitting: Obstetrics and Gynecology

## 2023-07-08 ENCOUNTER — Encounter: Payer: Self-pay | Admitting: Obstetrics and Gynecology

## 2023-07-08 VITALS — BP 115/55 | HR 89

## 2023-07-08 DIAGNOSIS — Z9889 Other specified postprocedural states: Secondary | ICD-10-CM

## 2023-07-08 DIAGNOSIS — T83722A Exposure of implanted urethral mesh into urethra, initial encounter: Secondary | ICD-10-CM

## 2023-07-08 DIAGNOSIS — Z48816 Encounter for surgical aftercare following surgery on the genitourinary system: Secondary | ICD-10-CM

## 2023-07-08 NOTE — Progress Notes (Signed)
 [facility]  Date of Visit: 07/08/2023  History of Present Illness: Ms. Mckeel is a 63 y.o. female scheduled today for a post-operative visit.   Surgery: s/p Robotic assisted total laparoscopic hysterectomy, bilateral salpingo-oophorectomy, sacrocolpopexy Cleopatra Dada Lite Y), single incision sling (Altis), cystoscopy, posterior repair and perineorrhaphy on 05/26/23  She passed her postoperative void trial.   Postoperative course has been uncomplicated.   Today she reports she is feeling well. She is happy with the results of the surgery. Has been using estrace  cream twice a week.   UTI in the last 6 weeks? No  Pain? No  She has returned to her normal activity (except for postop restrictions) Vaginal bulge? No  Stress incontinence: No  Urgency/frequency: No  Urge incontinence: No  Voiding dysfunction: No  Bowel issues: No   Subjective Success: Do you usually have a bulge or something falling out that you can see or feel in the vaginal area? No  Retreatment Success: Any retreatment with surgery or pessary for any compartment? No   Pathology results: UTERUS AND CERVIX, WITH BILATERAL FALLOPIAN TUBES AND OVARIES, HYSTERECTOMY: Cervix:           Acute cervicitis with ulcer.           Negative for dysplasia or malignancy.       Endocervix:           Nabothian cysts.           Negative for hyperplasia, atypia or malignancy.       Endometrium:           Benign inactive endometrium.           Negative for hyperplasia, atypia or malignancy.       Myometrium:           Adenomyosis.           Negative for malignancy.       Serosa:           Unremarkable.           Negative for malignancy.       Bilateral fallopian tubes:           Benign fimbriated fallopian tubes with paratubal cysts.           Negative for malignancy.       Bilateral ovaries:           Unremarkable.           Negative for malignancy.   Medications: She has a current medication list which  includes the following prescription(s): acetaminophen , acetaminophen , ascorbic acid, aspirin ec, atorvastatin , b complex vitamins, calcium -vitamin d, cranberry, estradiol , ibuprofen , magnesium, metoprolol  succinate, multivitamin with minerals, oxycodone , polyethylene glycol powder, and probiotic product.   Allergies: Patient is allergic to cephalexin.   Physical Exam: BP (!) 115/55   Pulse 89   Abdomen: soft, non-tender, without masses or organomegaly Laparoscopic Incisions: healing well.  Pelvic Examination: perineal incisions well healed. Vagina: Cuff well healed, sutures present, no granulation tissue. No tenderness along the anterior or posterior vagina. No apical tenderness. No pelvic masses. Mesh palpable under urethra and visualized approx 1cm mesh exposure  POP-Q: POP-Q  -3                                            Aa   -3  Ba  -8                                              C   2.5                                            Gh  6                                            Pb  8                                            tvl   -3                                            Ap  -3                                            Bp                                                 D    ---------------------------------------------------------  Assessment and Plan:  1. Post-operative state   2. 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 Will reexamine in a month. We discussed that we may need to return to OR for closure.  - Pathology results were reviewed with the patient today and she verbalized understanding that the results were benign.  - Can resume regular activity including exercise. Discussed avoidance of heavy lifting and straining long term to reduce the risk of recurrence. Wait an additional 6 weeks  for intercourse.  Return 1 month  Arma Lamp, MD

## 2023-08-07 ENCOUNTER — Ambulatory Visit (INDEPENDENT_AMBULATORY_CARE_PROVIDER_SITE_OTHER): Admitting: Obstetrics and Gynecology

## 2023-08-07 ENCOUNTER — Encounter: Payer: Self-pay | Admitting: Obstetrics and Gynecology

## 2023-08-07 VITALS — BP 121/77 | HR 88

## 2023-08-07 DIAGNOSIS — Z48816 Encounter for surgical aftercare following surgery on the genitourinary system: Secondary | ICD-10-CM

## 2023-08-07 DIAGNOSIS — T83721D Exposure of implanted vaginal mesh and other prosthetic materials into vagina, subsequent encounter: Secondary | ICD-10-CM

## 2023-08-07 NOTE — Progress Notes (Signed)
 [facility]  Date of Visit: 08/07/2023  History of Present Illness: Ms. Eckstrom is a 63 y.o. female scheduled today for a post-operative visit.   Surgery: s/p Robotic assisted total laparoscopic hysterectomy, bilateral salpingo-oophorectomy, sacrocolpopexy Cleopatra Dada Lite Y), single incision sling (Altis), cystoscopy, posterior repair and perineorrhaphy on 05/26/23  She passed her postoperative void trial.   Postoperative course has been uncomplicated.   Today she reports she is feeling well. She is happy with the results of the surgery. Has been using estrace  cream twice a week.   UTI in the last 6 weeks? No  Pain? No  She has returned to her normal activity (except for postop restrictions) Vaginal bulge? No  Stress incontinence: No  Urgency/frequency: No  Urge incontinence: No  Voiding dysfunction: No  Bowel issues: No   Subjective Success: Do you usually have a bulge or something falling out that you can see or feel in the vaginal area? No  Retreatment Success: Any retreatment with surgery or pessary for any compartment? No   Pathology results: UTERUS AND CERVIX, WITH BILATERAL FALLOPIAN TUBES AND OVARIES, HYSTERECTOMY: Cervix:           Acute cervicitis with ulcer.           Negative for dysplasia or malignancy.       Endocervix:           Nabothian cysts.           Negative for hyperplasia, atypia or malignancy.       Endometrium:           Benign inactive endometrium.           Negative for hyperplasia, atypia or malignancy.       Myometrium:           Adenomyosis.           Negative for malignancy.       Serosa:           Unremarkable.           Negative for malignancy.       Bilateral fallopian tubes:           Benign fimbriated fallopian tubes with paratubal cysts.           Negative for malignancy.       Bilateral ovaries:           Unremarkable.           Negative for malignancy.   Medications: She has a current medication list which  includes the following prescription(s): acetaminophen , acetaminophen , ascorbic acid, aspirin ec, atorvastatin , b complex vitamins, calcium -vitamin d, cranberry, estradiol , ibuprofen , magnesium, metoprolol  succinate, multivitamin with minerals, oxycodone , polyethylene glycol powder, and probiotic product.   Allergies: Patient is allergic to cephalexin.   Physical Exam: BP 121/77   Pulse 88   Abdomen: soft, non-tender, without masses or organomegaly Laparoscopic Incisions: healing well.  Pelvic Examination: 1cm midline mesh exposure   ---------------------------------------------------------  Assessment and Plan:  1. Exposure of vaginal mesh through vaginal wall, subsequent encounter     - We discussed that since the mesh exposure is persistent despite vaginal estrogen use, would recommend returning to OR for sling revision and cystoscopy to excise a small piece of the mesh and close the incision - Risks/ benefits of procedure discussed. Patient in agreement with plan.    Arma Lamp, MD

## 2023-09-16 ENCOUNTER — Other Ambulatory Visit: Payer: Self-pay | Admitting: Internal Medicine

## 2023-09-25 ENCOUNTER — Other Ambulatory Visit: Payer: Self-pay | Admitting: Obstetrics and Gynecology

## 2023-09-25 ENCOUNTER — Ambulatory Visit (INDEPENDENT_AMBULATORY_CARE_PROVIDER_SITE_OTHER): Admitting: Obstetrics and Gynecology

## 2023-09-25 VITALS — BP 127/85 | HR 87 | Ht 62.1 in | Wt 157.2 lb

## 2023-09-25 DIAGNOSIS — T83721D Exposure of implanted vaginal mesh and other prosthetic materials into vagina, subsequent encounter: Secondary | ICD-10-CM

## 2023-09-25 DIAGNOSIS — Z01818 Encounter for other preprocedural examination: Secondary | ICD-10-CM

## 2023-09-25 MED ORDER — TRAMADOL HCL 50 MG PO TABS: 50.0000 mg | ORAL_TABLET | Freq: Three times a day (TID) | ORAL | 0 refills | Status: AC | PRN

## 2023-09-25 MED ORDER — IBUPROFEN 600 MG PO TABS: 600.0000 mg | ORAL_TABLET | Freq: Four times a day (QID) | ORAL | 0 refills | Status: DC | PRN

## 2023-09-25 MED ORDER — ACETAMINOPHEN 500 MG PO TABS: 500.0000 mg | ORAL_TABLET | Freq: Four times a day (QID) | ORAL | 0 refills | Status: DC | PRN

## 2023-09-25 MED ORDER — POLYETHYLENE GLYCOL 3350 17 GM/SCOOP PO POWD: 17.0000 g | Freq: Every day | ORAL | 0 refills | Status: AC

## 2023-09-25 NOTE — Progress Notes (Signed)
 [facility] Pre-Operative Exam  Subjective Chief Complaint: [name] presents for a preoperative encounter.   History of Present Illness: [name] is a 63 y.o. female who presents for preoperative visit.  She is scheduled to undergo Exam under anesthesia, sling revision, and cystoscopy   on 10/05/23.  Her symptoms include mesh exposure.   Urodynamics showed: 1. Sensation was normal; capacity was normal 2. Stress Incontinence was demonstrated at normal pressures; 3. Detrusor Overactivity was not demonstrated. 4. Emptying was dysfunctional with a normal PVR, a sustained detrusor contraction present,  abdominal straining present, dyssynergic urethral sphincter activity on EMG.  Past Medical History:  Diagnosis Date   Allergic rhinitis due to pollen    Hemorrhoids    History of recurrent UTIs    Hyperlipidemia    Hypertension    Smokers' cough (HCC)    05-19-2023  per pt not every day, productive , clear   SUI (stress urinary incontinence, female)    Uterovaginal prolapse, incomplete    Wears glasses      Past Surgical History:  Procedure Laterality Date   BLADDER SUSPENSION N/A 05/26/2023   Procedure: TRANSVAGINAL TAPE (TVT) PROCEDURE;  Surgeon: Marilynne Rosaline SAILOR, MD;  Location: [facility] OR;  Service: Gynecology;  Laterality: N/A;   BREAST BIOPSY Right 02/10/2023   US  RT BREAST BX W LOC DEV 1ST LESION IMG BX SPEC US  GUIDE 02/10/2023 [facility]   BREAST BIOPSY Right 02/10/2023   US  RT BREAST BX W LOC DEV EA ADD LESION IMG BX SPEC US  GUIDE 02/10/2023 [facility]   COLONOSCOPY WITH PROPOFOL   12/27/2019   dr stark   CYSTOSCOPY N/A 05/26/2023   Procedure: CYSTOSCOPY;  Surgeon: Marilynne Rosaline SAILOR, MD;  Location: [facility] OR;  Service: Gynecology;  Laterality: N/A;   LAPAROSCOPIC APPENDECTOMY N/A 06/30/2013   Procedure: APPENDECTOMY LAPAROSCOPIC;  Surgeon: Vicenta DELENA Poli, MD;  Location: [facility] OR;  Service: General;  Laterality: N/A;   PERINEOPLASTY  N/A 05/26/2023   Procedure: PERINEOPLASTY, PERINEORRHAPHY AND POSTERIOR REPAIR;  Surgeon: Marilynne Rosaline SAILOR, MD;  Location: [facility] OR;  Service: Gynecology;  Laterality: N/A;   XI ROBOTIC ASSISTED TOTAL HYSTERECTOMY WITH SACROCOLPOPEXY N/A 05/26/2023   Procedure: XI ROBOTIC ASSISTED TOTAL HYSTERECTOMY WITH BILATERAL SALPINGO-OOPHORECTOMY AND SACROCOLPOPEXY;  Surgeon: Marilynne Rosaline SAILOR, MD;  Location: [facility] OR;  Service: Gynecology;  Laterality: N/A;  Total time requested is 3.5 hours [facility]    is allergic to cephalexin.   Family History  Problem Relation Age of Onset   Heart disease Mother    Diabetes Mother    Arthritis Father    Hyperlipidemia Sister    Obesity Brother    Heart disease Brother    Cancer Maternal Grandmother        melanoma   Heart disease Paternal Grandfather    Kidney disease Neg Hx    Kidney cancer Neg Hx    Bladder Cancer Neg Hx    Colon cancer Neg Hx    Colon polyps Neg Hx    Stomach cancer Neg Hx    Esophageal cancer Neg Hx     Social History   Tobacco Use   Smoking status: Every Day    Current packs/day: 1.00    Types: Cigarettes    Passive exposure: Never   Smokeless tobacco: Never   Tobacco comments:    05-19-2023 pt stated smokes 3/4 ppd,  started age 63  ;   for 37 yrs  Vaping Use   Vaping status: Never Used  Substance  Use Topics   Alcohol use: Not Currently    Comment: rare   Drug use: Never     Review of Systems was negative for a full 10 system review except as noted in the History of Present Illness.   Current Outpatient Medications:    acetaminophen  (TYLENOL ) 500 MG tablet, Take 1,000 mg by mouth every 6 (six) hours as needed for moderate pain (pain score 4-6)., Disp: , Rfl:    acetaminophen  (TYLENOL ) 500 MG tablet, Take 1 tablet (500 mg total) by mouth every 6 (six) hours as needed (pain)., Disp: 30 tablet, Rfl: 0   Ascorbic Acid (VITAMIN C PO), Take 1 tablet by mouth daily., Disp: , Rfl:    aspirin EC 81 MG tablet, Take 81 mg by  mouth every other day., Disp: , Rfl:    atorvastatin  (LIPITOR) 10 MG tablet, TAKE 1 TABLET BY MOUTH EVERY DAY, Disp: 90 tablet, Rfl: 3   B Complex Vitamins (VITAMIN B COMPLEX PO), Take 1 tablet by mouth daily., Disp: , Rfl:    CALCIUM -VITAMIN D PO, Take 1 tablet by mouth daily., Disp: , Rfl:    CRANBERRY EXTRACT PO, Take 1 capsule by mouth daily., Disp: , Rfl:    estradiol  (ESTRACE ) 0.1 MG/GM vaginal cream, Place 0.5g nightly twice a week (Patient taking differently: Place 1 Applicatorful vaginally 2 (two) times a week. Place 0.5g nightly twice a week), Disp: 30 g, Rfl: 11   ibuprofen  (ADVIL ) 600 MG tablet, Take 1 tablet (600 mg total) by mouth every 6 (six) hours as needed., Disp: 30 tablet, Rfl: 0   MAGNESIUM PO, Take 1 tablet by mouth daily., Disp: , Rfl:    metoprolol  succinate (TOPROL -XL) 50 MG 24 hr tablet, TAKE 1 TABLET BY MOUTH EVERY DAY WITH OR IMMEDIATELY FOLLOWING A MEAL (Patient taking differently: Take 50 mg by mouth daily.), Disp: 30 tablet, Rfl: 10   Multiple Vitamin (MULTIVITAMIN WITH MINERALS) TABS tablet, Take 1 tablet by mouth daily., Disp: , Rfl:    oxyCODONE  (OXY IR/ROXICODONE ) 5 MG immediate release tablet, Take 1 tablet (5 mg total) by mouth every 4 (four) hours as needed for severe pain (pain score 7-10)., Disp: 10 tablet, Rfl: 0   polyethylene glycol powder (GLYCOLAX /MIRALAX ) 17 GM/SCOOP powder, Take 17 g by mouth daily. Drink 17g (1 scoop) dissolved in water  per day., Disp: 255 g, Rfl: 0   Probiotic Product (PROBIOTIC PO), Take 1 tablet by mouth daily., Disp: , Rfl:    Objective Vitals:   09/25/23 0915  BP: 127/85  Pulse: 87    Gen: NAD CV: S1 S2 RRR Lungs: Clear to auscultation bilaterally Abd: soft, nontender   Previous Pelvic Exam showed: Abdomen: soft, non-tender, without masses or organomegaly Laparoscopic Incisions: healing well.  Pelvic Examination: perineal incisions well healed. Vagina: Cuff well healed, sutures present, no granulation tissue. No  tenderness along the anterior or posterior vagina. No apical tenderness. No pelvic masses. Mesh palpable under urethra and visualized approx 1cm mesh exposure   POP-Q: POP-Q   -3                                            Aa   -3  Ba   -8                                              C    2.5                                            Gh   6                                            Pb   8                                            tvl    -3                                            Ap   -3                                            Bp           Assessment/ Plan  Assessment: The patient is a 63 y.o. year old scheduled to undergo Exam under anesthesia, sling revision, and cystoscopy. Verbal consent was obtained for these procedures.  Plan: General Surgical Consent: The patient has previously been counseled on alternative treatments, and the decision by the patient and provider was to proceed with the procedure listed above.  For all procedures, there are risks of bleeding, infection, damage to surrounding organs including but not limited to bowel, bladder, blood vessels, ureters and nerves, and need for further surgery if an injury were to occur. These risks are all low with minimally invasive surgery.   There are risks of numbness and weakness at any body site or buttock/rectal pain.  It is possible that baseline pain can be worsened by surgery, either with or without mesh. If surgery is vaginal, there is also a low risk of possible conversion to laparoscopy or open abdominal incision where indicated. Very rare risks include blood transfusion, blood clot, heart attack, pneumonia, or death.   There is also a risk of short-term postoperative urinary retention with need to use a catheter. About half of patients need to go home from surgery with a catheter, which is then later removed in the office. The risk of long-term need for a  catheter is very low. There is also a risk of worsening of overactive bladder.    We discussed consent for blood products. Risks for blood transfusion include allergic reactions, other reactions that can affect different body organs and managed accordingly, transmission of infectious diseases such as HIV or Hepatitis. However, the blood is screened. Patient consents for blood products.  Pre-operative instructions:  She was instructed to not take Aspirin/NSAIDs x 7days prior to surgery. She will hold her 81mg  ASA. Antibiotic prophylaxis  was ordered as indicated.  Catheter use: Patient will go home with foley if needed after post-operative voiding trial.  Post-operative instructions:  She was provided with specific post-operative instructions, including precautions and signs/symptoms for which we would recommend contacting us , in addition to daytime and after-hours contact phone numbers. This was provided on a handout.   Post-operative medications: Prescriptions for motrin , tylenol , miralax , and Tramadol  were sent to her pharmacy. Discussed using ibuprofen  and tylenol  on a schedule to limit use of narcotics.   Laboratory testing:  We will check labs: As requested by anesthesia.   Preoperative clearance:  She does not require surgical clearance.    Post-operative follow-up:  A post-operative appointment will be made for 6 weeks from the date of surgery. If she needs a post-operative nurse visit for a voiding trial, that will be set up after she leaves the hospital.    Patient will call the clinic or use MyChart should anything change or any new issues arise.   Arihanna Estabrook G Tazaria Dlugosz, NP

## 2023-09-25 NOTE — H&P (Signed)
 [facility] H&P  Subjective Chief Complaint: [name] presents for a preoperative encounter.   History of Present Illness: [name] is a 63 y.o. female who presents for preoperative visit.  She is scheduled to undergo Exam under anesthesia, sling revision, and cystoscopy   on 10/05/23.  Her symptoms include mesh exposure.   Urodynamics showed: 1. Sensation was normal; capacity was normal 2. Stress Incontinence was demonstrated at normal pressures; 3. Detrusor Overactivity was not demonstrated. 4. Emptying was dysfunctional with a normal PVR, a sustained detrusor contraction present,  abdominal straining present, dyssynergic urethral sphincter activity on EMG.  Past Medical History:  Diagnosis Date   Allergic rhinitis due to pollen    Hemorrhoids    History of recurrent UTIs    Hyperlipidemia    Hypertension    Smokers' cough (HCC)    05-19-2023  per pt not every day, productive , clear   SUI (stress urinary incontinence, female)    Uterovaginal prolapse, incomplete    Wears glasses      Past Surgical History:  Procedure Laterality Date   BLADDER SUSPENSION N/A 05/26/2023   Procedure: TRANSVAGINAL TAPE (TVT) PROCEDURE;  Surgeon: Marilynne Rosaline SAILOR, MD;  Location: [facility] OR;  Service: Gynecology;  Laterality: N/A;   BREAST BIOPSY Right 02/10/2023   US  RT BREAST BX W LOC DEV 1ST LESION IMG BX SPEC US  GUIDE 02/10/2023 [facility]MO[facility]   BREAST BIOPSY Right 02/10/2023   US  RT BREAST BX W LOC DEV EA ADD LESION IMG BX SPEC US  GUIDE 02/10/2023 [facility]   COLONOSCOPY WITH PROPOFOL   12/27/2019   dr stark   CYSTOSCOPY N/A 05/26/2023   Procedure: CYSTOSCOPY;  Surgeon: Marilynne Rosaline SAILOR, MD;  Location: [facility] OR;  Service: Gynecology;  Laterality: N/A;   LAPAROSCOPIC APPENDECTOMY N/A 06/30/2013   Procedure: APPENDECTOMY LAPAROSCOPIC;  Surgeon: Vicenta DELENA Poli, MD;  Location: [facility] OR;  Service: General;  Laterality: N/A;   PERINEOPLASTY N/A 05/26/2023    Procedure: PERINEOPLASTY, PERINEORRHAPHY AND POSTERIOR REPAIR;  Surgeon: Marilynne Rosaline SAILOR, MD;  Location: [facility] OR;  Service: Gynecology;  Laterality: N/A;   XI ROBOTIC ASSISTED TOTAL HYSTERECTOMY WITH SACROCOLPOPEXY N/A 05/26/2023   Procedure: XI ROBOTIC ASSISTED TOTAL HYSTERECTOMY WITH BILATERAL SALPINGO-OOPHORECTOMY AND SACROCOLPOPEXY;  Surgeon: Marilynne Rosaline SAILOR, MD;  Location: [facility] OR;  Service: Gynecology;  Laterality: N/A;  Total time requested is 3.5 hours [facility]    is allergic to cephalexin.   Family History  Problem Relation Age of Onset   Heart disease Mother    Diabetes Mother    Arthritis Father    Hyperlipidemia Sister    Obesity Brother    Heart disease Brother    Cancer Maternal Grandmother        melanoma   Heart disease Paternal Grandfather    Kidney disease Neg Hx    Kidney cancer Neg Hx    Bladder Cancer Neg Hx    Colon cancer Neg Hx    Colon polyps Neg Hx    Stomach cancer Neg Hx    Esophageal cancer Neg Hx     Social History   Tobacco Use   Smoking status: Every Day    Current packs/day: 1.00    Types: Cigarettes    Passive exposure: Never   Smokeless tobacco: Never   Tobacco comments:    05-19-2023 pt stated smokes 3/4 ppd,  started age 5  ;   for 37 yrs  Vaping Use   Vaping status: Never Used  Substance Use  Topics   Alcohol use: Not Currently    Comment: rare   Drug use: Never     Review of Systems was negative for a full 10 system review except as noted in the History of Present Illness.  No current facility-administered medications for this encounter.  Current Outpatient Medications:    acetaminophen  (TYLENOL ) 500 MG tablet, Take 1 tablet (500 mg total) by mouth every 6 (six) hours as needed (pain)., Disp: 30 tablet, Rfl: 0   Ascorbic Acid (VITAMIN C PO), Take 1 tablet by mouth daily., Disp: , Rfl:    aspirin EC 81 MG tablet, Take 81 mg by mouth every other day., Disp: , Rfl:    atorvastatin  (LIPITOR) 10 MG tablet, TAKE 1 TABLET BY  MOUTH EVERY DAY, Disp: 90 tablet, Rfl: 3   B Complex Vitamins (VITAMIN B COMPLEX PO), Take 1 tablet by mouth daily., Disp: , Rfl:    CALCIUM -VITAMIN D PO, Take 1 tablet by mouth daily., Disp: , Rfl:    CRANBERRY EXTRACT PO, Take 1 capsule by mouth daily., Disp: , Rfl:    estradiol  (ESTRACE ) 0.1 MG/GM vaginal cream, Place 0.5g nightly twice a week (Patient taking differently: Place 1 Applicatorful vaginally 2 (two) times a week. Place 0.5g nightly twice a week), Disp: 30 g, Rfl: 11   ibuprofen  (ADVIL ) 600 MG tablet, Take 1 tablet (600 mg total) by mouth every 6 (six) hours as needed., Disp: 30 tablet, Rfl: 0   MAGNESIUM PO, Take 1 tablet by mouth daily., Disp: , Rfl:    metoprolol  succinate (TOPROL -XL) 50 MG 24 hr tablet, TAKE 1 TABLET BY MOUTH EVERY DAY WITH OR IMMEDIATELY FOLLOWING A MEAL (Patient taking differently: Take 50 mg by mouth daily.), Disp: 30 tablet, Rfl: 10   Multiple Vitamin (MULTIVITAMIN WITH MINERALS) TABS tablet, Take 1 tablet by mouth daily., Disp: , Rfl:    polyethylene glycol powder (GLYCOLAX /MIRALAX ) 17 GM/SCOOP powder, Take 17 g by mouth daily. Drink 17g (1 scoop) dissolved in water  per day., Disp: 255 g, Rfl: 0   Probiotic Product (PROBIOTIC PO), Take 1 tablet by mouth daily., Disp: , Rfl:    traMADol  (ULTRAM ) 50 MG tablet, Take 1 tablet (50 mg total) by mouth every 8 (eight) hours as needed for up to 5 days., Disp: 10 tablet, Rfl: 0   Objective There were no vitals filed for this visit.   Gen: NAD CV: S1 S2 RRR Lungs: Clear to auscultation bilaterally Abd: soft, nontender   Previous Pelvic Exam showed: Abdomen: soft, non-tender, without masses or organomegaly Laparoscopic Incisions: healing well.  Pelvic Examination: perineal incisions well healed. Vagina: Cuff well healed, sutures present, no granulation tissue. No tenderness along the anterior or posterior vagina. No apical tenderness. No pelvic masses. Mesh palpable under urethra and visualized approx 1cm mesh  exposure   POP-Q: POP-Q   -3                                            Aa   -3                                           Ba   -8  C    2.5                                            Gh   6                                            Pb   8                                            tvl    -3                                            Ap   -3                                            Bp           Assessment/ Plan  Assessment: The patient is a 63 y.o. year old scheduled to undergo Exam under anesthesia, sling revision, and cystoscopy. Verbal consent was obtained for these procedures.

## 2023-09-30 ENCOUNTER — Encounter (HOSPITAL_COMMUNITY): Payer: Self-pay | Admitting: Obstetrics and Gynecology

## 2023-09-30 NOTE — Progress Notes (Signed)
 Spoke w/ via phone for pre-op interview--- [name] needs dos----  BMP per anesthesia.       Lab results------Current EKG in Epic dated 05/22/23. COVID test -----patient states asymptomatic no test needed Arrive at -------1145 NPO after MN NO Solid Food.  Clear liquids from MN until---1045 Pre-Surgery Ensure or G2:  Med rec completed Medications to take morning of surgery -----Metoprolol  Diabetic medication -----  GLP1 agonist last dose: GLP1 instructions:  Patient instructed no nail polish to be worn day of surgery Patient instructed to bring photo id and insurance card day of surgery Patient aware to have Driver (ride ) / caregiver    for 24 hours after surgery - Daughter [name] Patient Special Instructions ----- Pre-Op special Instructions -----  Patient verbalized understanding of instructions that were given at this phone interview. Patient denies chest pain, sob, fever, cough at the interview.

## 2023-10-05 ENCOUNTER — Ambulatory Visit (HOSPITAL_COMMUNITY): Admitting: Anesthesiology

## 2023-10-05 ENCOUNTER — Encounter: Payer: Self-pay | Admitting: Obstetrics and Gynecology

## 2023-10-05 ENCOUNTER — Encounter (HOSPITAL_COMMUNITY): Payer: Self-pay | Admitting: Obstetrics and Gynecology

## 2023-10-05 ENCOUNTER — Ambulatory Visit (HOSPITAL_COMMUNITY)
Admission: RE | Admit: 2023-10-05 | Discharge: 2023-10-05 | Disposition: A | Discharge status: Home / Self Care | Attending: Obstetrics and Gynecology | Admitting: Obstetrics and Gynecology

## 2023-10-05 ENCOUNTER — Encounter (HOSPITAL_COMMUNITY)
Admission: RE | Disposition: A | Discharge status: Home / Self Care | Payer: Self-pay | Source: Home / Self Care | Attending: Obstetrics and Gynecology

## 2023-10-05 ENCOUNTER — Telehealth: Payer: Self-pay | Admitting: Obstetrics and Gynecology

## 2023-10-05 ENCOUNTER — Other Ambulatory Visit: Payer: Self-pay

## 2023-10-05 DIAGNOSIS — Y831 Surgical operation with implant of artificial internal device as the cause of abnormal reaction of the patient, or of later complication, without mention of misadventure at the time of the procedure: Secondary | ICD-10-CM | POA: Diagnosis not present

## 2023-10-05 DIAGNOSIS — T83721A Exposure of implanted vaginal mesh and other prosthetic materials into vagina, initial encounter: Secondary | ICD-10-CM | POA: Diagnosis not present

## 2023-10-05 DIAGNOSIS — N393 Stress incontinence (female) (male): Secondary | ICD-10-CM | POA: Diagnosis not present

## 2023-10-05 DIAGNOSIS — Z01818 Encounter for other preprocedural examination: Secondary | ICD-10-CM

## 2023-10-05 DIAGNOSIS — T83722A Exposure of implanted urethral mesh into urethra, initial encounter: Secondary | ICD-10-CM | POA: Insufficient documentation

## 2023-10-05 DIAGNOSIS — Z79899 Other long term (current) drug therapy: Secondary | ICD-10-CM | POA: Insufficient documentation

## 2023-10-05 DIAGNOSIS — F17210 Nicotine dependence, cigarettes, uncomplicated: Secondary | ICD-10-CM | POA: Insufficient documentation

## 2023-10-05 DIAGNOSIS — I10 Essential (primary) hypertension: Secondary | ICD-10-CM | POA: Diagnosis not present

## 2023-10-05 HISTORY — PX: REVISION URINARY SLING: SHX6213

## 2023-10-05 HISTORY — PX: CYSTOSCOPY: SHX5120

## 2023-10-05 HISTORY — PX: EXAM UNDER ANESTHESIA, PELVIC: SHX7461

## 2023-10-05 LAB — BASIC METABOLIC PANEL WITH GFR
Anion gap: 11 (ref 5–15)
BUN: 9 mg/dL (ref 8–23)
CO2: 20 mmol/L — ABNORMAL LOW (ref 22–32)
Calcium: 9.8 mg/dL (ref 8.9–10.3)
Chloride: 106 mmol/L (ref 98–111)
Creatinine, Ser: 0.6 mg/dL (ref 0.44–1.00)
GFR, Estimated: >60 mL/min (ref >60)
Glucose, Bld: 92 mg/dL (ref 70–99)
Potassium: 4.1 mmol/L (ref 3.5–5.1)
Sodium: 137 mmol/L (ref 135–145)

## 2023-10-05 SURGERY — REVISION, SLING OPERATION, FOR STRESS INCONTINENCE
Anesthesia: General | Site: Vagina

## 2023-10-05 MED ORDER — PROPOFOL 10 MG/ML IV BOLUS
INTRAVENOUS | Status: DC | PRN
  Administered 2023-10-05: 150 mg via INTRAVENOUS

## 2023-10-05 MED ORDER — DEXAMETHASONE SODIUM PHOSPHATE 10 MG/ML IJ SOLN
INTRAMUSCULAR | Status: DC | PRN
  Administered 2023-10-05: 5 mg via INTRAVENOUS

## 2023-10-05 MED ORDER — LACTATED RINGERS IV SOLN: INTRAVENOUS | Status: DC

## 2023-10-05 MED ORDER — SODIUM CHLORIDE 0.9 % IR SOLN
Status: DC | PRN
  Administered 2023-10-05: 1000 mL via INTRAVESICAL

## 2023-10-05 MED ORDER — FENTANYL CITRATE (PF) 250 MCG/5ML IJ SOLN
INTRAMUSCULAR | Status: DC | PRN
  Administered 2023-10-05 (×3): 50 ug via INTRAVENOUS

## 2023-10-05 MED ORDER — OXYCODONE HCL 5 MG PO TABS: 5.0000 mg | ORAL_TABLET | Freq: Once | ORAL | Status: DC | PRN

## 2023-10-05 MED ORDER — 0.9 % SODIUM CHLORIDE (POUR BTL) OPTIME
TOPICAL | Status: DC | PRN
  Administered 2023-10-05: 1000 mL

## 2023-10-05 MED ORDER — ACETAMINOPHEN 500 MG PO TABS
ORAL_TABLET | ORAL | Status: AC
  Filled 2023-10-05: qty 2

## 2023-10-05 MED ORDER — ACETAMINOPHEN 500 MG PO TABS
1000.0000 mg | ORAL_TABLET | ORAL | Status: AC
Start: 2023-10-05 — End: 2023-10-05
  Administered 2023-10-05: 1000 mg via ORAL

## 2023-10-05 MED ORDER — ONDANSETRON HCL 4 MG/2ML IJ SOLN
INTRAMUSCULAR | Status: DC | PRN
  Administered 2023-10-05: 4 mg via INTRAVENOUS

## 2023-10-05 MED ORDER — MIDAZOLAM HCL 2 MG/2ML IJ SOLN
INTRAMUSCULAR | Status: DC | PRN
  Administered 2023-10-05: 2 mg via INTRAVENOUS

## 2023-10-05 MED ORDER — MIDAZOLAM HCL 2 MG/2ML IJ SOLN
INTRAMUSCULAR | Status: AC
  Filled 2023-10-05: qty 2

## 2023-10-05 MED ORDER — LIDOCAINE 2% (20 MG/ML) 5 ML SYRINGE
INTRAMUSCULAR | Status: AC
  Filled 2023-10-05: qty 5

## 2023-10-05 MED ORDER — LIDOCAINE-EPINEPHRINE 1 %-1:100000 IJ SOLN
INTRAMUSCULAR | Status: DC | PRN
  Administered 2023-10-05: 7 mL

## 2023-10-05 MED ORDER — OXYCODONE HCL 5 MG/5ML PO SOLN: 5.0000 mg | Freq: Once | ORAL | Status: DC | PRN

## 2023-10-05 MED ORDER — HYDROMORPHONE HCL 1 MG/ML IJ SOLN: 0.2500 mg | INTRAMUSCULAR | Status: DC | PRN

## 2023-10-05 MED ORDER — PROPOFOL 500 MG/50ML IV EMUL
INTRAVENOUS | Status: DC | PRN
  Administered 2023-10-05: 150 ug/kg/min via INTRAVENOUS

## 2023-10-05 MED ORDER — PHENAZOPYRIDINE HCL 100 MG PO TABS
ORAL_TABLET | ORAL | Status: AC
  Filled 2023-10-05: qty 2

## 2023-10-05 MED ORDER — CHLORHEXIDINE GLUCONATE 0.12 % MT SOLN
OROMUCOSAL | Status: DC
Start: 2023-10-05 — End: 2023-10-05
  Filled 2023-10-05: qty 15

## 2023-10-05 MED ORDER — FENTANYL CITRATE (PF) 250 MCG/5ML IJ SOLN
INTRAMUSCULAR | Status: AC
  Filled 2023-10-05: qty 5

## 2023-10-05 MED ORDER — DEXAMETHASONE SODIUM PHOSPHATE 10 MG/ML IJ SOLN
INTRAMUSCULAR | Status: AC
  Filled 2023-10-05: qty 1

## 2023-10-05 MED ORDER — CEFAZOLIN SODIUM-DEXTROSE 2-4 GM/100ML-% IV SOLN
2.0000 g | INTRAVENOUS | Status: AC
  Administered 2023-10-05: 2 g via INTRAVENOUS

## 2023-10-05 MED ORDER — CHLORHEXIDINE GLUCONATE 0.12 % MT SOLN
15.0000 mL | Freq: Once | OROMUCOSAL | Status: AC
  Administered 2023-10-05: 15 mL via OROMUCOSAL

## 2023-10-05 MED ORDER — PROPOFOL 10 MG/ML IV BOLUS
INTRAVENOUS | Status: AC
  Filled 2023-10-05: qty 20

## 2023-10-05 MED ORDER — GABAPENTIN 300 MG PO CAPS
ORAL_CAPSULE | ORAL | Status: DC
Start: 2023-10-05 — End: 2023-10-05
  Filled 2023-10-05: qty 1

## 2023-10-05 MED ORDER — CEFAZOLIN SODIUM-DEXTROSE 2-4 GM/100ML-% IV SOLN
INTRAVENOUS | Status: AC
  Filled 2023-10-05: qty 100

## 2023-10-05 MED ORDER — PHENAZOPYRIDINE HCL 100 MG PO TABS
200.0000 mg | ORAL_TABLET | ORAL | Status: AC
  Administered 2023-10-05: 200 mg via ORAL

## 2023-10-05 MED ORDER — GABAPENTIN 300 MG PO CAPS
300.0000 mg | ORAL_CAPSULE | ORAL | Status: AC
  Administered 2023-10-05: 300 mg via ORAL

## 2023-10-05 MED ORDER — LIDOCAINE 2% (20 MG/ML) 5 ML SYRINGE
INTRAMUSCULAR | Status: DC | PRN
  Administered 2023-10-05: 20 mg via INTRAVENOUS

## 2023-10-05 MED ORDER — MIDAZOLAM HCL 2 MG/2ML IJ SOLN: 0.5000 mg | Freq: Once | INTRAMUSCULAR | Status: DC | PRN

## 2023-10-05 MED ORDER — MEPERIDINE HCL 25 MG/ML IJ SOLN: 6.2500 mg | INTRAMUSCULAR | Status: DC | PRN

## 2023-10-05 MED ORDER — ORAL CARE MOUTH RINSE: 15.0000 mL | Freq: Once | OROMUCOSAL | Status: AC

## 2023-10-05 MED ORDER — ONDANSETRON HCL 4 MG/2ML IJ SOLN
INTRAMUSCULAR | Status: AC
  Filled 2023-10-05: qty 2

## 2023-10-05 SURGICAL SUPPLY — 25 items
BLADE SURG 15 STRL LF DISP TIS (BLADE) ×3 IMPLANT
GLOVE BIOGEL PI IND STRL 6.5 (GLOVE) ×3 IMPLANT
GLOVE BIOGEL PI IND STRL 7.0 (GLOVE) IMPLANT
GLOVE ECLIPSE 6.0 STRL STRAW (GLOVE) ×3 IMPLANT
GOWN STRL REUS W/ TWL LRG LVL3 (GOWN DISPOSABLE) ×3 IMPLANT
GOWN STRL REUS W/TWL LRG LVL3 (GOWN DISPOSABLE) ×3 IMPLANT
HIBICLENS CHG 4% 4OZ BTL (MISCELLANEOUS) ×3 IMPLANT
HOLDER FOLEY CATH W/STRAP (MISCELLANEOUS) ×3 IMPLANT
IV NS 1000ML BAXH (IV SOLUTION) IMPLANT
KIT TURNOVER KIT B (KITS) ×3 IMPLANT
MANIFOLD NEPTUNE II (INSTRUMENTS) ×3 IMPLANT
NDL HYPO 22X1.5 SAFETY MO (MISCELLANEOUS) ×3 IMPLANT
NEEDLE HYPO 22X1.5 SAFETY MO (MISCELLANEOUS) ×3 IMPLANT
NS IRRIG 1000ML POUR BTL (IV SOLUTION) ×3 IMPLANT
PACK VAGINAL WOMENS (CUSTOM PROCEDURE TRAY) ×3 IMPLANT
PAD OB MATERNITY 11 LF (PERSONAL CARE ITEMS) ×3 IMPLANT
RETRACTOR LONE STAR DISPOSABLE (INSTRUMENTS) ×3 IMPLANT
RETRACTOR STAY HOOK 5MM (MISCELLANEOUS) ×3 IMPLANT
SET IRRIG Y TYPE TUR BLADDER L (SET/KITS/TRAYS/PACK) IMPLANT
SLEEVE SCD COMPRESS KNEE MED (STOCKING) ×3 IMPLANT
SUCTION TUBE FRAZIER 10FR DISP (SUCTIONS) ×3 IMPLANT
SUT VIC AB 2-0 SH 27XBRD (SUTURE) ×3 IMPLANT
SYR BULB EAR ULCER 3OZ GRN STR (SYRINGE) ×3 IMPLANT
TOWEL GREEN STERILE FF (TOWEL DISPOSABLE) ×3 IMPLANT
TRAY FOLEY W/BAG SLVR 14FR (SET/KITS/TRAYS/PACK) ×3 IMPLANT

## 2023-10-05 NOTE — Anesthesia Preprocedure Evaluation (Addendum)
 Anesthesia Evaluation  Patient identified by MRN, date of birth, ID band Patient awake    Reviewed: Allergy & Precautions, NPO status , Patient's Chart, lab work & pertinent test results, reviewed documented beta blocker date and time   History of Anesthesia Complications Negative for: history of anesthetic complications  Airway Mallampati: II  TM Distance: >3 FB Neck ROM: Full    Dental  (+) Dental Advisory Given   Pulmonary Current SmokerPatient did not abstain from smoking.   breath sounds clear to auscultation       Cardiovascular hypertension, Pt. on medications and Pt. on home beta blockers (-) angina  Rhythm:Regular Rate:Normal     Neuro/Psych negative neurological ROS  negative psych ROS   GI/Hepatic negative GI ROS, Neg liver ROS,,,  Endo/Other  negative endocrine ROS    Renal/GU negative Renal ROS     Musculoskeletal   Abdominal   Peds  Hematology negative hematology ROS (+)   Anesthesia Other Findings   Reproductive/Obstetrics                              Anesthesia Physical Anesthesia Plan  ASA: 2  Anesthesia Plan: General   Post-op Pain Management: Tylenol  PO (pre-op)*   Induction: Intravenous  PONV Risk Score and Plan: 2 and Ondansetron  and Dexamethasone   Airway Management Planned: LMA  Additional Equipment: None  Intra-op Plan:   Post-operative Plan:   Informed Consent: I have reviewed the patients History and Physical, chart, labs and discussed the procedure including the risks, benefits and alternatives for the proposed anesthesia with the patient or authorized representative who has indicated his/her understanding and acceptance.     Dental advisory given  Plan Discussed with: CRNA and Surgeon  Anesthesia Plan Comments:          Anesthesia Quick Evaluation

## 2023-10-05 NOTE — Telephone Encounter (Signed)
 [name] underwent mesh sling revision, cystoscopy on 10/05/23.   [name] passed [name] voiding trial.  was backfilled into the bladder Voided  PVR by bladder scan was 19ml.   [name] was discharged without a catheter. Please call [name] for a routine post op check. Thanks!  Jacqueline LOISE Caper, MD

## 2023-10-05 NOTE — Anesthesia Postprocedure Evaluation (Signed)
 Anesthesia Post Note  Patient: [name]  Procedure(s) Performed: REVISION, SLING OPERATION, FOR STRESS INCONTINENCE (Urethra) CYSTOSCOPY (Bladder) EXAM UNDER ANESTHESIA, PELVIC (Vagina )     Patient location during evaluation: PACU Anesthesia Type: General Level of consciousness: awake and alert, oriented and patient cooperative Pain management: pain level controlled Vital Signs Assessment: post-procedure vital signs reviewed and stable Respiratory status: spontaneous breathing, nonlabored ventilation and respiratory function stable Cardiovascular status: blood pressure returned to baseline and stable Postop Assessment: no apparent nausea or vomiting and adequate PO intake Anesthetic complications: no   No notable events documented.  Last Vitals:  Vitals:   10/05/23 1150 10/05/23 1341  BP: (!) 147/76 123/63  Pulse: 79 76  Resp: 17 18  Temp: 36.8 C 36.8 C  SpO2: 98% 97%    Last Pain:  Vitals:   10/05/23 1341  TempSrc:   PainSc: 0-No pain                 Maaz Spiering,E. Wyley Hack

## 2023-10-05 NOTE — Discharge Instructions (Signed)
 POST OPERATIVE INSTRUCTIONS  General Instructions Recovery (not bed rest) will last approximately 2- 6 weeks Walking is encouraged, but refrain from strenuous exercise/ housework/ heavy lifting for 2 weeks then can resume regular activity. No lifting >10lbs  Nothing in the vagina- NO intercourse, tampons or douching x 6 weeks Bathing:  Do not submerge in water  (NO swimming, bath, hot tub, etc) until after your postop visit. You can shower starting the day after surgery.  No driving until you are not taking narcotic pain medicine and until your pain is well enough controlled that you can slam on the breaks or make sudden movements if needed.   Taking your medications Please take your acetaminophen  and ibuprofen  on a schedule for the first 48 hours. Take 600mg  ibuprofen , then take 500mg  acetaminophen  3 hours later, then continue to alternate ibuprofen  and acetaminophen . That way you are taking each type of medication every 6 hours. Take the prescribed narcotic (oxycodone , tramadol , etc) as needed, with a maximum being every 4 hours.  Take a stool softener daily to keep your stools soft and preventing you from straining. If you have diarrhea, you decrease your stool softener. This is explained more below. We have prescribed you Miralax .  Reasons to Call the Nurse (see last page for phone numbers) Heavy Bleeding (changing your pad every 1-2 hours) Persistent nausea/vomiting Fever (100.4 degrees or more) Incision problems (pus or other fluid coming out, redness, warmth, increased pain)  Things to Expect After Surgery Mild to Moderate pain is normal during the first day or two after surgery. If prescribed, take Ibuprofen  or Tylenol  first and use the stronger medicine for "break-through" pain. You can overlap these medicines because they work differently.   Constipation   To Prevent Constipation:  Eat a well-balanced diet including protein, grains, fresh fruit and vegetables.  Drink plenty of  fluids. Walk regularly.  Depending on specific instructions from your physician: take Miralax  daily and additionally you can add a stool softener (colace/ docusate) and fiber supplement. Continue as long as you're on pain medications.   To Treat Constipation:  If you do not have a bowel movement in 2 days after surgery, you can take 2 Tbs of Milk of Magnesia 1-2 times a day until you have a bowel movement. If diarrhea occurs, decrease the amount or stop the laxative. If no results with Milk of Magnesia, you can drink a bottle of magnesium citrate which you can purchase over the counter.  Fatigue:  This is a normal response to surgery and will improve with time.  Plan frequent rest periods throughout the day.  Gas Pain:  This is very common but can also be very painful! Drink warm liquids such as herbal teas, bouillon or soup. Walking will help you pass more gas.  Mylicon or Gas-X can be taken over the counter.  Leaking Urine:  Varying amounts of leakage may occur after surgery.  This should improve with time. Your bladder needs at least 3 months to recover from surgery. If you leak after surgery, be sure to mention this to your doctor at your post-op visit. If you were taking medications for overactive bladder prior to surgery, be sure to restart the medications immediately after surgery.  Incisions: If you have incisions on your abdomen, the skin glue will dissolve on its own over time. It is ok to gently rinse with soap and water  over these incisions but do not scrub.  Catheter Approximately 50% of patients are unable to urinate after surgery and  need to go home with a catheter. This allows your bladder to rest so it can return to full function. If you go home with a catheter, the office will call to set up a voiding trial a few days after surgery. For most patients, by this visit, they are able to urinate on their own. Long term catheter use is rare.   Return to Work  As work demands and recovery  times vary widely, it is hard to predict when you will want to return to work. If you have a desk job with no strenuous physical activity, and if you would like to return sooner than generally recommended, discuss this with your provider or call our office.   Post op concerns  For non-emergent issues, please call the Urogynecology Nurse. Please leave a message and someone will contact you within one business day.  You can also send a message through MyChart.   AFTER HOURS (After 5:00 PM and on weekends):  For urgent matters that cannot wait until the next business day. Call our office [phone] and connect to the doctor on call.  Please reserve this for important issues.   **FOR ANY TRUE EMERGENCY ISSUES CALL 911 OR GO TO THE NEAREST EMERGENCY ROOM.** Please inform our office or the doctor on call of any emergency.     APPOINTMENTS: Call [phone]

## 2023-10-05 NOTE — Transfer of Care (Signed)
 Immediate Anesthesia Transfer of Care Note  Patient: [name]  Procedure(s) Performed: REVISION, SLING OPERATION, FOR STRESS INCONTINENCE (Urethra) CYSTOSCOPY (Bladder) EXAM UNDER ANESTHESIA, PELVIC (Vagina )  Patient Location: PACU  Anesthesia Type:General  Level of Consciousness: drowsy and patient cooperative  Airway & Oxygen Therapy: Patient Spontanous Breathing and Patient connected to face mask oxygen  Post-op Assessment: Report given to RN, Post -op Vital signs reviewed and stable, and Patient moving all extremities  Post vital signs: Reviewed and stable  Last Vitals:  Vitals Value Taken Time  BP    Temp    Pulse 73 10/05/23 13:43  Resp 18 10/05/23 13:43  SpO2 96 % 10/05/23 13:43  Vitals shown include unfiled device data.  Last Pain:  Vitals:   10/05/23 1150  TempSrc: Oral  PainSc: 0-No pain      Patients Stated Pain Goal: 5 (10/05/23 1150)  Complications: No notable events documented.

## 2023-10-05 NOTE — Interval H&P Note (Signed)
 History and Physical Interval Note:  10/05/2023 12:28 PM  [name]  has presented today for surgery, with the diagnosis of mesh exposure.  The various methods of treatment have been discussed with the patient and family. After consideration of risks, benefits and other options for treatment, the patient has consented to  Procedure(s): REVISION, SLING OPERATION, FOR STRESS INCONTINENCE (N/A) CYSTOSCOPY (N/A) EXAM UNDER ANESTHESIA, PELVIC (N/A) as a surgical intervention.  The patient's history has been reviewed, patient examined, no change in status, stable for surgery.  I have reviewed the patient's chart and labs.  Questions were answered to the patient's satisfaction.     [name]

## 2023-10-05 NOTE — Op Note (Signed)
 Operative Note  Preoperative Diagnosis: mesh exposure of midurethral sling  Postoperative Diagnosis: same  Procedures performed:  Mesh sling revision, cystoscopy  Implants: none  Attending Surgeon: Rosaline Caper, MD  Anesthesia: General LMA  Findings: 1. 1.5cm vaginal mesh exposure in left/ lateral suburethra of previously placed midurethral sling  2. On cystoscopy, normal bladder and urethral mucosa without injury or lesion. Brisk bilateral ureteral efflux present.     Specimens: none  Estimated blood loss: 1 mL  IV fluids: 500 mL  Urine output: 300 mL  Complications: none  Procedure in Detail:  After informed consent was obtained, the patient was taken to the operating room where anesthesia was induced and found to be adequate. She was placed in dorsal lithotomy position, taking care to avoid any injury, and prepped and draped in the usual sterile fashion. A cystoscopy was performed with a  70 degree cystoscope. A 360 degree view of the bladder was obtained. There was no injury, lesion or foreign bodied present. Brisk bilateral ureteral efflux was noted. Urethroscopy was performed while removing the scope and no injury or mesh was noted. A foley catheter was placed. A lone star vaginal retractor was placed with four stays. On inspection, there was an area in the midurethra on the right with mesh exposure. Each side was grasped with allis clamps and 1% lidocaine  with epinephrine  was injected into the mucosa. Thorek scissors were used to dissect the epithelium from the urethral tissue and mesh. Once the mesh was isolated, it was grasped and the exposed portion was cut, approximately 1.5cm.   A cystoscopy was again performed with a  70 degree cystoscope. No injury was noted to the bladder or urethra. The scope was removed and the foley was replaced.   The vaginal epithelium was reapproximated with 2-0 vicryl suture in an running fashion. Good hemostasis was noted. The patient  tolerated the procedure well and was taken to the recovery in stable condition. Needle and sponge count was correct x2.   [name], MD

## 2023-10-05 NOTE — Anesthesia Procedure Notes (Signed)
 Procedure Name: LMA Insertion Date/Time: 10/05/2023 1:06 PM  Performed by: Shlomo Tinnie SAILOR, RNPre-anesthesia Checklist: Patient identified, Emergency Drugs available, Suction available and Patient being monitored Patient Re-evaluated:Patient Re-evaluated prior to induction Oxygen Delivery Method: Circle System Utilized Preoxygenation: Pre-oxygenation with 100% oxygen Induction Type: IV induction Ventilation: Mask ventilation without difficulty LMA: LMA inserted LMA Size: 4.0 Number of attempts: 1 Airway Equipment and Method: Bite block Placement Confirmation: positive ETCO2 Tube secured with: Tape Dental Injury: Teeth and Oropharynx as per pre-operative assessment

## 2023-10-06 ENCOUNTER — Encounter (HOSPITAL_COMMUNITY): Payer: Self-pay | Admitting: Obstetrics and Gynecology

## 2023-11-02 ENCOUNTER — Encounter: Payer: Self-pay | Admitting: Internal Medicine

## 2023-11-02 ENCOUNTER — Ambulatory Visit (INDEPENDENT_AMBULATORY_CARE_PROVIDER_SITE_OTHER): Payer: 59 | Admitting: Internal Medicine

## 2023-11-02 VITALS — BP 138/80 | HR 73 | Temp 98.8°F | Ht 62.5 in | Wt 157.0 lb

## 2023-11-02 DIAGNOSIS — Z0000 Encounter for general adult medical examination without abnormal findings: Secondary | ICD-10-CM

## 2023-11-02 DIAGNOSIS — F17210 Nicotine dependence, cigarettes, uncomplicated: Secondary | ICD-10-CM | POA: Diagnosis not present

## 2023-11-02 DIAGNOSIS — I10 Essential (primary) hypertension: Secondary | ICD-10-CM

## 2023-11-02 NOTE — Progress Notes (Signed)
 Subjective:    Patient ID: [name], female    DOB: [birth year], 63 y.o.   MRN: [id number]  HPI Here for physical  No problems Had hysterectomy in March--then mesh for bladder Wound up needing repair of the sling Now doing okay  Still smoking  Discussed this Has been walking regularly--and still watching grandkids (keeps her active)  Current Outpatient Medications on File Prior to Visit  Medication Sig Dispense Refill   Ascorbic Acid (VITAMIN C PO) Take 1 tablet by mouth daily.     aspirin EC 81 MG tablet Take 81 mg by mouth every other day. (Patient taking differently: Take 81 mg by mouth as directed. 4 times a week)     atorvastatin  (LIPITOR) 10 MG tablet TAKE 1 TABLET BY MOUTH EVERY DAY 90 tablet 3   B Complex Vitamins (VITAMIN B COMPLEX PO) Take 1 tablet by mouth daily.     CALCIUM -VITAMIN D PO Take 1 tablet by mouth daily.     CRANBERRY EXTRACT PO Take 1 capsule by mouth daily.     estradiol  (ESTRACE ) 0.1 MG/GM vaginal cream Place 0.5g nightly twice a week (Patient taking differently: Place 1 Applicatorful vaginally 2 (two) times a week. Place 0.5g nightly twice a week) 30 g 11   MAGNESIUM PO Take 1 tablet by mouth daily.     metoprolol  succinate (TOPROL -XL) 50 MG 24 hr tablet TAKE 1 TABLET BY MOUTH EVERY DAY WITH OR IMMEDIATELY FOLLOWING A MEAL (Patient taking differently: Take 50 mg by mouth daily.) 30 tablet 10   Multiple Vitamin (MULTIVITAMIN WITH MINERALS) TABS tablet Take 1 tablet by mouth daily.     No current facility-administered medications on file prior to visit.    Allergies  Allergen Reactions   Cephalexin Hives and Rash    Past Medical History:  Diagnosis Date   Allergic rhinitis due to pollen    Hemorrhoids    History of recurrent UTIs    Hyperlipidemia    Hypertension    Smokers' cough (HCC)    05-19-2023  per pt not every day, productive , clear   SUI (stress urinary incontinence, female)    Uterovaginal prolapse, incomplete    Wears  glasses     Past Surgical History:  Procedure Laterality Date   BLADDER SUSPENSION N/A 05/26/2023   Procedure: TRANSVAGINAL TAPE (TVT) PROCEDURE;  Surgeon: Marilynne Rosaline SAILOR, MD;  Location: [facility] OR;  Service: Gynecology;  Laterality: N/A;   BREAST BIOPSY Right 02/10/2023   US  RT BREAST BX W LOC DEV 1ST LESION IMG BX SPEC US  GUIDE 02/10/2023 [facility]   BREAST BIOPSY Right 02/10/2023   US  RT BREAST BX W LOC DEV EA ADD LESION IMG BX SPEC US  GUIDE 02/10/2023 [facility]   COLONOSCOPY WITH PROPOFOL   12/27/2019   dr stark   CYSTOSCOPY N/A 05/26/2023   Procedure: CYSTOSCOPY;  Surgeon: Marilynne Rosaline SAILOR, MD;  Location: [facility] OR;  Service: Gynecology;  Laterality: N/A;   CYSTOSCOPY N/A 10/05/2023   Procedure: CYSTOSCOPY;  Surgeon: Marilynne Rosaline SAILOR, MD;  Location: [facility] OR;  Service: Urology;  Laterality: N/A;   EXAM UNDER ANESTHESIA, PELVIC N/A 10/05/2023   Procedure: EXAM UNDER ANESTHESIA, PELVIC;  Surgeon: Marilynne Rosaline SAILOR, MD;  Location: [facility] OR;  Service: Urology;  Laterality: N/A;   LAPAROSCOPIC APPENDECTOMY N/A 06/30/2013   Procedure: APPENDECTOMY LAPAROSCOPIC;  Surgeon: Vicenta DELENA Poli, MD;  Location: [facility] OR;  Service: General;  Laterality: N/A;   PERINEOPLASTY N/A 05/26/2023   Procedure: PERINEOPLASTY, PERINEORRHAPHY AND  POSTERIOR REPAIR;  Surgeon: Marilynne Rosaline SAILOR, MD;  Location: [facility] OR;  Service: Gynecology;  Laterality: N/A;   REVISION URINARY SLING N/A 10/05/2023   Procedure: REVISION, SLING OPERATION, FOR STRESS INCONTINENCE;  Surgeon: Marilynne Rosaline SAILOR, MD;  Location: [facility] OR;  Service: Urology;  Laterality: N/A;   XI ROBOTIC ASSISTED TOTAL HYSTERECTOMY WITH SACROCOLPOPEXY N/A 05/26/2023   Procedure: XI ROBOTIC ASSISTED TOTAL HYSTERECTOMY WITH BILATERAL SALPINGO-OOPHORECTOMY AND SACROCOLPOPEXY;  Surgeon: Marilynne Rosaline SAILOR, MD;  Location: [facility] OR;  Service: Gynecology;  Laterality: N/A;  Total time requested is 3.5 hours [facility]    Family History  Problem  Relation Age of Onset   Heart disease Mother    Diabetes Mother    Arthritis Father    Hyperlipidemia Sister    Obesity Brother    Heart disease Brother    Cancer Maternal Grandmother        melanoma   Heart disease Paternal Grandfather    Kidney disease Neg Hx    Kidney cancer Neg Hx    Bladder Cancer Neg Hx    Colon cancer Neg Hx    Colon polyps Neg Hx    Stomach cancer Neg Hx    Esophageal cancer Neg Hx     Social History   Socioeconomic History   Marital status: Widowed    Spouse name: Not on file   Number of children: 2   Years of education: Not on file   Highest education level: 12th grade  Occupational History   Occupation: housewife  Tobacco Use   Smoking status: Every Day    Current packs/day: 1.00    Types: Cigarettes    Passive exposure: Never   Smokeless tobacco: Never   Tobacco comments:    05-19-2023 pt stated smokes 3/4 ppd,  started age 31  ;   for 37 yrs  Vaping Use   Vaping status: Never Used  Substance and Sexual Activity   Alcohol use: Not Currently    Comment: rare   Drug use: Never   Sexual activity: Not on file  Other Topics Concern   Not on file  Social History Narrative   2 daughters   Social Drivers of Health   Financial Resource Strain: Low Risk  (11/01/2023)   Overall Financial Resource Strain (CARDIA)    Difficulty of Paying Living Expenses: Not hard at all  Food Insecurity: No Food Insecurity (11/01/2023)   Hunger Vital Sign    Worried About Running Out of Food in the Last Year: Never true    Ran Out of Food in the Last Year: Never true  Transportation Needs: No Transportation Needs (11/01/2023)   PRAPARE - Administrator, Civil Service (Medical): No    Lack of Transportation (Non-Medical): No  Physical Activity: Insufficiently Active (11/01/2023)   Exercise Vital Sign    Days of Exercise per Week: 3 days    Minutes of Exercise per Session: 40 min  Stress: Stress Concern Present (11/01/2023)   [employer] of  Occupational Health - Occupational Stress Questionnaire    Feeling of Stress: To some extent  Social Connections: Moderately Integrated (11/01/2023)   Social Connection and Isolation Panel    Frequency of Communication with Friends and Family: More than three times a week    Frequency of Social Gatherings with Friends and Family: Once a week    Attends Religious Services: More than 4 times per year    Active Member of [employer] or Organizations: Yes    Attends [employer]  or Organization Meetings: 1 to 4 times per year    Marital Status: Widowed  Intimate Partner Violence: Not on file   Review of Systems  Constitutional:  Negative for fatigue and unexpected weight change.       Wears seat belt  HENT:  Negative for dental problem, hearing loss and tinnitus.        Keeps up with dentist  Eyes:        Early cataracts but vision is okay--slight change  Respiratory:  Negative for chest tightness and shortness of breath.        Only occ cough  Cardiovascular:  Negative for chest pain, palpitations and leg swelling.  Gastrointestinal:  Negative for blood in stool.       Needed some miralax  after recent surgery No heartburn  Endocrine: Negative for polydipsia and polyuria.  Genitourinary:  Negative for dysuria and hematuria.  Musculoskeletal:  Negative for arthralgias, back pain and joint swelling.  Skin:        Bug bite on foot---plans to go back to derm  Allergic/Immunologic: Positive for environmental allergies. Negative for immunocompromised state.       OTC med helps  Neurological:  Negative for dizziness, syncope, light-headedness and headaches.  Hematological:  Negative for adenopathy. Does not bruise/bleed easily.  Psychiatric/Behavioral:  Negative for dysphoric mood and sleep disturbance. The patient is not nervous/anxious.        Objective:   Physical Exam Constitutional:      Appearance: Normal appearance.  HENT:     Mouth/Throat:     Pharynx: No oropharyngeal exudate or posterior  oropharyngeal erythema.  Eyes:     Conjunctiva/sclera: Conjunctivae normal.     Pupils: Pupils are equal, round, and reactive to light.  Cardiovascular:     Rate and Rhythm: Normal rate and regular rhythm.     Pulses: Normal pulses.     Heart sounds: No murmur heard.    No gallop.  Pulmonary:     Effort: Pulmonary effort is normal.     Breath sounds: Normal breath sounds. No wheezing or rales.  Abdominal:     Palpations: Abdomen is soft.     Tenderness: There is no abdominal tenderness.  Musculoskeletal:     Cervical back: Neck supple.     Right lower leg: No edema.     Left lower leg: No edema.  Lymphadenopathy:     Cervical: No cervical adenopathy.  Skin:    Findings: No rash.  Neurological:     General: No focal deficit present.     Mental Status: She is alert and oriented to person, place, and time.  Psychiatric:        Mood and Affect: Mood normal.        Behavior: Behavior normal.            Assessment & Plan:

## 2023-11-02 NOTE — Assessment & Plan Note (Signed)
 Healthy Discussed fitness Still prefers no flu or COVID vaccines Colon due 2031 Yearly mammogram in November No pap due to hyster

## 2023-11-02 NOTE — Assessment & Plan Note (Signed)
Discussed cessation Will refer for lung cancer screening

## 2023-11-02 NOTE — Assessment & Plan Note (Signed)
 BP Readings from Last 3 Encounters:  11/02/23 138/80  10/05/23 123/63  09/25/23 127/85   Controlled with metoprolol  48

## 2023-11-04 ENCOUNTER — Telehealth: Payer: Self-pay | Admitting: Acute Care

## 2023-11-04 DIAGNOSIS — Z122 Encounter for screening for malignant neoplasm of respiratory organs: Secondary | ICD-10-CM

## 2023-11-04 DIAGNOSIS — Z87891 Personal history of nicotine dependence: Secondary | ICD-10-CM

## 2023-11-04 DIAGNOSIS — F17210 Nicotine dependence, cigarettes, uncomplicated: Secondary | ICD-10-CM

## 2023-11-04 NOTE — Telephone Encounter (Signed)
 Lung Cancer Screening Narrative/Criteria Questionnaire (Cigarette Smokers Only- No Cigars/Pipes/vapes)   Devere MARLA Payment   SDMV:11/09/23 at 1pm/Kristen                                           03/19/1960               LDCT: 11/13/23 at 0930a/Opic    63 y.o.   Phone: [phone]  Lung Screening Narrative (confirm age 36-77 yrs Medicare / 50-80 yrs Private pay insurance)   Insurance information:[employer]   Referring Provider:Letvak   This screening involves an initial phone call with a team member from our program. It is called a shared decision making visit. The initial meeting is required by insurance and Medicare to make sure you understand the program. This appointment takes about 15-20 minutes to complete. The CT scan will completed at a separate date/time. This scan takes about 5-10 minutes to complete and you may eat and drink before and after the scan.  Criteria questions for Lung Cancer Screening:   Are you a current or former smoker? Current Age began smoking: 63yo   If you are a former smoker, what year did you quit smoking? NA   To calculate your smoking history, I need an accurate estimate of how many packs of cigarettes you smoked per day and for how many years. (Not just the number of PPD you are now smoking)   Years smoking 37 x Packs per day 1 = Pack years 37   (at least 20 pack yrs)   (Make sure they understand that we need to know how much they have smoked in the past, not just the number of PPD they are smoking now)  Do you have a personal history of cancer?  No    Do you have a family history of cancer? No  Are you coughing up blood?  No  Have you had unexplained weight loss of 15 lbs or more in the last 6 months? No  It looks like you meet all criteria.     Additional information: N/A

## 2023-11-09 ENCOUNTER — Ambulatory Visit (INDEPENDENT_AMBULATORY_CARE_PROVIDER_SITE_OTHER): Admitting: *Deleted

## 2023-11-09 ENCOUNTER — Encounter: Payer: Self-pay | Admitting: *Deleted

## 2023-11-09 DIAGNOSIS — F17210 Nicotine dependence, cigarettes, uncomplicated: Secondary | ICD-10-CM | POA: Diagnosis not present

## 2023-11-09 NOTE — Progress Notes (Signed)
 Virtual Visit via Telephone Note  I connected with [name] on 11/09/23 at  1:00 PM EDT by telephone and verified that I am speaking with the correct person using two identifiers.  Location: Patient: in home Provider: [address] [place], [place], [address]    Shared Decision Making Visit Lung Cancer Screening Program [phone])   Eligibility: Age 63 y.o. Pack Years Smoking History Calculation 37 (# packs/per year x # years smoked) Recent History of coughing up blood  no Unexplained weight loss? no ( >Than 15 pounds within the last 6 months ) Prior History Lung / other cancer no (Diagnosis within the last 5 years already requiring surveillance chest CT Scans). Smoking Status Current Smoker Former Smokers: Years since quit:  NA  Quit Date: NA  Visit Components: Discussion included one or more decision making aids. yes Discussion included risk/benefits of screening. yes Discussion included potential follow up diagnostic testing for abnormal scans. yes Discussion included meaning and risk of over diagnosis. yes Discussion included meaning and risk of False Positives. yes Discussion included meaning of total radiation exposure. yes  Counseling Included: Importance of adherence to annual lung cancer LDCT screening. yes Impact of comorbidities on ability to participate in the program. yes Ability and willingness to under diagnostic treatment. yes  Smoking Cessation Counseling: Current Smokers:  Discussed importance of smoking cessation. yes Information about tobacco cessation classes and interventions provided to patient. yes Patient provided with ticket for LDCT Scan. yes Symptomatic Patient. no  Counseling NA Diagnosis Code: Tobacco Use Z72.0 Asymptomatic Patient yes  Counseling (Intermediate counseling: > three minutes counseling) [id number]  Counseled patient 3-4 minutes regarding tobacco use.   Former Smokers:  Discussed the importance of maintaining  cigarette abstinence. yes Diagnosis Code: Personal History of Nicotine Dependence. S12.108 Information about tobacco cessation classes and interventions provided to patient. Yes Patient provided with ticket for LDCT Scan. yes Written Order for Lung Cancer Screening with LDCT placed in Epic. Yes (CT Chest Lung Cancer Screening Low Dose W/O CM) [id number] Z12.2-Screening of respiratory organs Z87.891-Personal history of nicotine dependence   Josette Ranger, RN 11/09/23

## 2023-11-09 NOTE — Patient Instructions (Signed)

## 2023-11-13 ENCOUNTER — Ambulatory Visit
Admission: RE | Admit: 2023-11-13 | Discharge: 2023-11-13 | Disposition: A | Discharge status: Home / Self Care | Source: Ambulatory Visit | Attending: Acute Care | Admitting: Acute Care

## 2023-11-13 DIAGNOSIS — I2510 Atherosclerotic heart disease of native coronary artery without angina pectoris: Secondary | ICD-10-CM | POA: Insufficient documentation

## 2023-11-13 DIAGNOSIS — J439 Emphysema, unspecified: Secondary | ICD-10-CM | POA: Diagnosis not present

## 2023-11-13 DIAGNOSIS — F17210 Nicotine dependence, cigarettes, uncomplicated: Secondary | ICD-10-CM | POA: Diagnosis present

## 2023-11-13 DIAGNOSIS — Z87891 Personal history of nicotine dependence: Secondary | ICD-10-CM

## 2023-11-13 DIAGNOSIS — Z122 Encounter for screening for malignant neoplasm of respiratory organs: Secondary | ICD-10-CM | POA: Diagnosis present

## 2023-11-13 DIAGNOSIS — I700 Atherosclerosis of aorta: Secondary | ICD-10-CM | POA: Insufficient documentation

## 2023-11-15 ENCOUNTER — Other Ambulatory Visit: Payer: Self-pay | Admitting: Internal Medicine

## 2023-11-17 ENCOUNTER — Encounter: Payer: Self-pay | Admitting: Obstetrics and Gynecology

## 2023-11-17 ENCOUNTER — Ambulatory Visit (INDEPENDENT_AMBULATORY_CARE_PROVIDER_SITE_OTHER): Admitting: Obstetrics and Gynecology

## 2023-11-17 VITALS — BP 163/80 | HR 84

## 2023-11-17 DIAGNOSIS — N952 Postmenopausal atrophic vaginitis: Secondary | ICD-10-CM

## 2023-11-17 DIAGNOSIS — Z9889 Other specified postprocedural states: Secondary | ICD-10-CM

## 2023-11-17 DIAGNOSIS — Z48816 Encounter for surgical aftercare following surgery on the genitourinary system: Secondary | ICD-10-CM

## 2023-11-17 MED ORDER — ESTRADIOL 0.1 MG/GM VA CREA: TOPICAL_CREAM | VAGINAL | 11 refills | Status: AC

## 2023-11-17 NOTE — Progress Notes (Signed)
 [facility]  Date of Visit: 11/17/2023  History of Present Illness: Ms. [name] is a 63 y.o. female scheduled today for a post-operative visit.   Surgery: s/p Mesh sling revision, cystoscopy on 10/05/23 Also s/p Robotic assisted total laparoscopic hysterectomy, bilateral salpingo-oophorectomy, sacrocolpopexy [facility]), single incision sling ([facility]), cystoscopy, posterior repair and perineorrhaphy on 05/26/23   She passed her postoperative void trial.   Postoperative course has been uncomplicated.   Today she reports she is feeling.   UTI in the last 6 weeks? No  Pain? No  She has returned to her normal activity (except for postop restrictions) Vaginal bulge? No  Stress incontinence: No  Urgency/frequency: No  Urge incontinence: No  Voiding dysfunction: No  Bowel issues: has had some constipation and used miralax  but back to normal   Medications: She has a current medication list which includes the following prescription(s): ascorbic acid, aspirin ec, atorvastatin , b complex vitamins, calcium  carb-cholecalciferol, cranberry, magnesium, metoprolol  succinate, multivitamin with minerals, and estradiol .   Allergies: Patient is allergic to cephalexin.   Physical Exam: BP (!) 163/80   Pulse 84     Pelvic Examination: Vagina: Incisions healing well. Sutures are present at incision line and there is not granulation tissue. No tenderness along the anterior or posterior vagina. No apical tenderness. No pelvic masses. No visible or palpable mesh.  POP-Q: POP-Q  -3                                            Aa   -3                                           Ba  -8                                              C   3                                            Gh  6                                            Pb  8                                            tvl   -3                                            Ap  -3  Bp                                                  D    ---------------------------------------------------------  Assessment and Plan:  1. Post-operative state   2. Vaginal atrophy     - Well healed. Can resume all normal activity.  - Continue estrace  cream twice a week, refill provided  Return in about 1 year (around 11/16/2024) or sooner if needed

## 2023-11-17 NOTE — Patient Instructions (Addendum)
 Continue with vaginal estrogen twice a week  Can resume regular activity

## 2023-11-20 NOTE — Progress Notes (Signed)
 Counseled patient 4 minutes regarding tobacco use.

## 2023-12-02 ENCOUNTER — Other Ambulatory Visit: Payer: Self-pay | Admitting: Acute Care

## 2023-12-02 DIAGNOSIS — Z122 Encounter for screening for malignant neoplasm of respiratory organs: Secondary | ICD-10-CM

## 2023-12-02 DIAGNOSIS — F17210 Nicotine dependence, cigarettes, uncomplicated: Secondary | ICD-10-CM

## 2023-12-02 DIAGNOSIS — Z87891 Personal history of nicotine dependence: Secondary | ICD-10-CM

## 2023-12-04 ENCOUNTER — Ambulatory Visit: Payer: Self-pay | Admitting: Urology

## 2024-11-04 ENCOUNTER — Encounter
